# Patient Record
Sex: Female | Born: 1980 | Race: White | Hispanic: No | Marital: Married | Smoking: Former smoker
Health system: Southern US, Community
[De-identification: ages and names within clinical notes are randomized; demographics above are authoritative.]

## PROBLEM LIST (undated history)

## (undated) DIAGNOSIS — N390 Urinary tract infection, site not specified: Secondary | ICD-10-CM

## (undated) DIAGNOSIS — K219 Gastro-esophageal reflux disease without esophagitis: Secondary | ICD-10-CM

## (undated) DIAGNOSIS — Z8619 Personal history of other infectious and parasitic diseases: Secondary | ICD-10-CM

## (undated) DIAGNOSIS — J189 Pneumonia, unspecified organism: Secondary | ICD-10-CM

## (undated) HISTORY — DX: Personal history of other infectious and parasitic diseases: Z86.19

## (undated) HISTORY — PX: NO PAST SURGERIES: SHX2092

## (undated) HISTORY — DX: Urinary tract infection, site not specified: N39.0

---
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
## 2003-11-11 ENCOUNTER — Other Ambulatory Visit: Admission: RE | Admit: 2003-11-11 | Discharge: 2003-11-11 | Payer: Self-pay | Admitting: Obstetrics and Gynecology

## 2006-01-04 ENCOUNTER — Other Ambulatory Visit: Admission: RE | Admit: 2006-01-04 | Discharge: 2006-01-04 | Payer: Self-pay | Admitting: Obstetrics and Gynecology

## 2006-06-12 ENCOUNTER — Inpatient Hospital Stay (HOSPITAL_COMMUNITY): Admission: AD | Admit: 2006-06-12 | Discharge: 2006-06-12 | Payer: Self-pay | Admitting: Obstetrics and Gynecology

## 2006-07-20 ENCOUNTER — Inpatient Hospital Stay (HOSPITAL_COMMUNITY): Admission: AD | Admit: 2006-07-20 | Discharge: 2006-07-23 | Payer: Self-pay | Admitting: Obstetrics and Gynecology

## 2006-07-21 ENCOUNTER — Encounter (INDEPENDENT_AMBULATORY_CARE_PROVIDER_SITE_OTHER): Payer: Self-pay | Admitting: Specialist

## 2006-08-04 ENCOUNTER — Inpatient Hospital Stay (HOSPITAL_COMMUNITY): Admission: AD | Admit: 2006-08-04 | Discharge: 2006-08-05 | Payer: Self-pay | Admitting: Obstetrics and Gynecology

## 2006-08-04 ENCOUNTER — Encounter (INDEPENDENT_AMBULATORY_CARE_PROVIDER_SITE_OTHER): Payer: Self-pay | Admitting: Specialist

## 2006-12-27 ENCOUNTER — Emergency Department (HOSPITAL_COMMUNITY): Admission: EM | Admit: 2006-12-27 | Discharge: 2006-12-27 | Payer: Self-pay | Admitting: Emergency Medicine

## 2006-12-28 ENCOUNTER — Emergency Department (HOSPITAL_COMMUNITY): Admission: EM | Admit: 2006-12-28 | Discharge: 2006-12-28 | Payer: Self-pay | Admitting: Emergency Medicine

## 2008-01-07 ENCOUNTER — Emergency Department (HOSPITAL_COMMUNITY): Admission: EM | Admit: 2008-01-07 | Discharge: 2008-01-07 | Payer: Self-pay | Admitting: Emergency Medicine

## 2009-05-06 ENCOUNTER — Emergency Department (HOSPITAL_BASED_OUTPATIENT_CLINIC_OR_DEPARTMENT_OTHER): Admission: EM | Admit: 2009-05-06 | Discharge: 2009-05-06 | Payer: Self-pay | Admitting: Emergency Medicine

## 2009-10-13 ENCOUNTER — Emergency Department (HOSPITAL_BASED_OUTPATIENT_CLINIC_OR_DEPARTMENT_OTHER): Admission: EM | Admit: 2009-10-13 | Discharge: 2009-10-13 | Payer: Self-pay | Admitting: Emergency Medicine

## 2010-01-27 ENCOUNTER — Emergency Department (HOSPITAL_BASED_OUTPATIENT_CLINIC_OR_DEPARTMENT_OTHER): Admission: EM | Admit: 2010-01-27 | Discharge: 2010-01-27 | Payer: Self-pay | Admitting: Emergency Medicine

## 2010-12-26 ENCOUNTER — Emergency Department (HOSPITAL_BASED_OUTPATIENT_CLINIC_OR_DEPARTMENT_OTHER)
Admission: EM | Admit: 2010-12-26 | Discharge: 2010-12-26 | Payer: Self-pay | Source: Home / Self Care | Admitting: Emergency Medicine

## 2011-01-07 ENCOUNTER — Emergency Department (HOSPITAL_BASED_OUTPATIENT_CLINIC_OR_DEPARTMENT_OTHER)
Admission: EM | Admit: 2011-01-07 | Discharge: 2011-01-07 | Payer: Self-pay | Source: Home / Self Care | Admitting: Emergency Medicine

## 2011-01-31 ENCOUNTER — Emergency Department (INDEPENDENT_AMBULATORY_CARE_PROVIDER_SITE_OTHER): Payer: Managed Care, Other (non HMO)

## 2011-01-31 ENCOUNTER — Emergency Department (HOSPITAL_BASED_OUTPATIENT_CLINIC_OR_DEPARTMENT_OTHER)
Admission: EM | Admit: 2011-01-31 | Discharge: 2011-01-31 | Disposition: A | Discharge status: Other Acute Inpatient Hospital | Payer: Managed Care, Other (non HMO) | Source: Home / Self Care | Attending: Emergency Medicine | Admitting: Emergency Medicine

## 2011-01-31 ENCOUNTER — Inpatient Hospital Stay (HOSPITAL_COMMUNITY)
Admission: RE | Admit: 2011-01-31 | Discharge: 2011-02-02 | DRG: 203 | Disposition: A | Discharge status: Home / Self Care | Payer: Managed Care, Other (non HMO) | Source: Other Acute Inpatient Hospital | Attending: Internal Medicine | Admitting: Internal Medicine

## 2011-01-31 DIAGNOSIS — J45901 Unspecified asthma with (acute) exacerbation: Principal | ICD-10-CM | POA: Diagnosis present

## 2011-01-31 DIAGNOSIS — R0902 Hypoxemia: Secondary | ICD-10-CM | POA: Diagnosis present

## 2011-01-31 DIAGNOSIS — R000 Tachycardia, unspecified: Secondary | ICD-10-CM | POA: Diagnosis present

## 2011-01-31 DIAGNOSIS — R05 Cough: Secondary | ICD-10-CM

## 2011-01-31 DIAGNOSIS — D72829 Elevated white blood cell count, unspecified: Secondary | ICD-10-CM | POA: Diagnosis present

## 2011-01-31 LAB — BASIC METABOLIC PANEL
BUN: 7 mg/dL (ref 6–23)
Calcium: 8.8 mg/dL (ref 8.4–10.5)
Chloride: 107 mEq/L (ref 96–112)
GFR calc Af Amer: >60 mL/min (ref >60)
Potassium: 3.7 mEq/L (ref 3.5–5.1)
Sodium: 143 mEq/L (ref 135–145)

## 2011-01-31 LAB — DIFFERENTIAL
Basophils Absolute: 0.1 10*3/uL (ref 0.0–0.1)
Basophils Relative: 0 % (ref 0–1)
Eosinophils Relative: 12 % — ABNORMAL HIGH (ref 0–5)
Neutro Abs: 10.4 10*3/uL — ABNORMAL HIGH (ref 1.7–7.7)
Neutrophils Relative %: 68 % (ref 43–77)

## 2011-01-31 LAB — CBC: MCHC: 34.4 g/dL (ref 30.0–36.0)

## 2011-02-01 ENCOUNTER — Encounter (HOSPITAL_COMMUNITY): Payer: Self-pay

## 2011-02-01 ENCOUNTER — Inpatient Hospital Stay (HOSPITAL_COMMUNITY): Payer: Managed Care, Other (non HMO)

## 2011-02-01 MED ORDER — IOHEXOL 300 MG/ML  SOLN
100.0000 mL | Freq: Once | INTRAMUSCULAR | Status: AC | PRN
  Administered 2011-02-01: 100 mL via INTRAVENOUS

## 2011-02-05 NOTE — Discharge Summary (Signed)
NAMEGEORJEAN, [name]               ACCOUNT NO.:  [id number]  MEDICAL RECORD NO.:  [id number]           PATIENT TYPE:  I  LOCATION:  6732                         FACILITY:  [facility]  PHYSICIAN:  Andreas Blower, MD       DATE OF BIRTH:  Mar 03, 1981  DATE OF ADMISSION:  01/31/2011 DATE OF DISCHARGE:  02/02/2011                              DISCHARGE SUMMARY   PRIMARY CARE PHYSICIAN:  Eston Esters, MD,  [facility]  DISCHARGING DIAGNOSES: 1. Asthma exacerbation. 2. Hypoxia. 3. Tachycardia. 4. Leukocytosis.  DISCHARGE MEDICATIONS: 1. Advair Diskus 250/50 one puff twice daily to be started after     completing course of oral steroids. 2. Moxifloxacin 400 mg p.o. daily for 5 more days. 3. Prednisone 60 mg p.o. daily for 1 day, then 40 mg p.o. daily for 1     day, then 20 mg p.o. daily for 1 day, then 10 mg p.o. daily for 1     day, then 5 mg p.o. daily for 1 day, then discontinue. 4. Albuterol inhaler 1 puff daily as needed. 5. Vitamin C over-the-counter 1 tablet p.o. daily.  BRIEF ADMITTING HISTORY AND PHYSICAL:  Ms. [name] is a 30 year old Caucasian female with history of asthma who has been on Advair has been taken off Advair presents with increased wheezing, gray sputum, and productive cough and with asthma exacerbation.  RADIOLOGY/IMAGING: 1. The patient had chest x-ray two-view which shows findings     suggestive of bronchitis, interstitial pneumonia, or reactive     airway disease. 2. The patient had CT of the chest with contrast which shows no     evidence of pulmonary embolism.  Patchy bilateral upper lobe     airspace disease.  Minimal ground-glass opacities process seen in     the lower lobes.  Findings could represent small airway     disease/bronchiolitis or early bronchopneumonia.  LABORATORY DATA:  CBC shows a white count of 15.3, hemoglobin 14.1, hematocrit 41.0, and platelet count 323.  Electrolytes normal with a creatinine of 0.7.  D-dimer  0.51.  HOSPITAL COURSE BY PROBLEM: 1. Asthma exacerbation.  The patient was started on IV steroids and     was transitioned to p.o. steroids during the course of hospital     stay.  She was also started on empiric moxifloxacin which was     transitioned from IV to p.o.  At the time of discharge, the patient     was moving air pretty well and was having good air movements.     Given the CT scan findings, I instructed the patient to follow up     with her primary care physician and to consider referral to     Pulmonology for consideration of further evaluation.  If after the     referral Pulmonary feels the patient is managed appropriately, we     will defer to her primary care physician for further management. 2. Leukocytosis, most likely secondary to asthma exacerbation. 3. Hypoxia secondary to asthma exacerbation and improved during the     course of the hospital stay. 4. Tachycardia, most likely secondary  to albuterol use, improved     during the course of the hospital stay.  The patient was adequately     hydrated during the course of the hospital stay.  DISPOSITION AND FOLLOWUP:  The patient to follow up with Dr. Lissa Morales, her primary care physician in 1 week.  Her primary care physician to consider a referral to Pulmonology if appropriate.  The patient was also given a 25-month supply of Advair.  TIME SPENT:  On discharge, talking to the patient, coordinating care, and giving her the instruction was 35 minutes.     Andreas Blower, MD     SR/MEDQ  D:  02/02/2011  T:  02/03/2011  Job:  [id number]  Electronically Signed by Wardell Heath Novak Stgermaine  on 02/05/2011 03:56:36 PM

## 2011-02-10 NOTE — H&P (Signed)
NAMECATHYANN, [name] NO.:  [id number]  MEDICAL RECORD NO.:  [id number]           PATIENT TYPE:  LOCATION:                                 FACILITY:  PHYSICIAN:  Houston Siren, MD           DATE OF BIRTH:  [birth year]  DATE OF ADMISSION: DATE OF DISCHARGE:                             HISTORY & PHYSICAL   PRIMARY CARE PHYSICIAN:  Unassigned to hospitalist.  ADVANCE DIRECTIVE:  Full code.  REASON FOR ADMISSION:  Asthmatic exacerbation.  HISTORY OF PRESENT ILLNESS:  This is a 30 year old female with asthma, previously on Advair 250, who was taken off this as she saw her new physician and was placed natural supplements, presents to [facility] with increased wheezing, and gray sputum productive cough.  She denies any fever or chills or any chest pain.  She has no calf tenderness, nausea, or vomiting.  There has been no diaphoresis.  She states that she had molds and dusts in her home.  She denied any distant travel or any ill contact.  Evaluation in the emergency room at [facility] showed a chest x-ray which has no infiltrate but bronchitic changes.  She also has a white count of 15,000 and normal creatinine.  She was given intravenous Solu- Medrol and a 1-hour nebulizer treatment, but with ambulation, she desatted to 74%.  Because of it, she was transferred to [facility] for further treatment of her asthmatic exacerbation.  PAST MEDICAL HISTORY:  Asthma.  MEDICATIONS:  Previously Advair 250 and Ventolin inhaler.  ALLERGIES:  ASPIRIN.  SOCIAL HISTORY:  She is not a smoker, not a drinker.  Denies any illicit drug use.  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  She is married with children.  PHYSICAL EXAMINATION:  VITAL SIGNS:  Temperature 97.7, blood pressure 130/80, pulse of 100. GENERAL:  She is alert and oriented and conversing.  She is in no respiratory distress.  She uses no accessory muscles. HEENT:  Facial symmetry.  Tongue is  midline.  No sinus tenderness. Throat is clear.  No stridor. CARDIAC:  S1 and S2, regular.  No murmur, rub, or gallop. LUNGS:  Diffuse wheezing, especially with deep coughing but no crackle. ABDOMEN:  Obese, nondistended, nontender. EXTREMITIES:  No edema.  No calf tenderness.  Good distal pulses bilaterally. SKIN:  Warm and dry. NEUROLOGIC:  Unremarkable. PSYCHIATRIC:  Unremarkable as well.  OBJECTIVE FINDINGS:  Chest x-ray showed bronchitic changes but no infiltrate. Serum sodium 143, potassium 3.7, creatinine 0.7.  White count of 15.3 thousand, hemoglobin of 14.1, platelet count of 323,000.  IMPRESSION:  This is a 30 year old with asthma exacerbation.  Her symptom is mild.  We will admit her to general medical floor.  We will continue Solu-Medrol at 80 mg IV q.8 h. and switch over to p.o. as soon as possible.  I will continue nebulizer treatment while awake.  We will give her intravenous Avelox for her gray sputum productive cough.  I will give 2 g of magnesium IV as well.  We will give her supplemental oxygen.  Upon discharge, she probably  should go back to Advair 250.  She is a full code and will be admitted to triad hospitalist [place] [facility].     Houston Siren, MD     PL/MEDQ  D:  01/31/2011  T:  01/31/2011  Job:  [id number]  Electronically Signed by Houston Siren  on 02/10/2011 02:55:16 AM

## 2011-05-07 NOTE — H&P (Signed)
[name], [name] NO.:  [id number]   MEDICAL RECORD NO.:  [id number]          PATIENT TYPE:  MAT   LOCATION:                                 FACILITY:   PHYSICIAN:  Juluis Mire, M.D.   DATE OF BIRTH:  1981-01-18   DATE OF ADMISSION:  12/19/2006  DATE OF DISCHARGE:                                HISTORY & PHYSICAL   The patient is a 30 year old, gravida 1 para 0, married, white female last  menstrual period of October 13, 2006, giving her an estimated date of  confinement of July 20, 2006.  This gives her an estimated gestational 40  weeks.  She presents with questionable rupture of membranes which was not  documented, however, biophysical profile revealed an amniotic fluid index of  6.5-cm that was the 4th percentile.  Estimated fetal weight was 6 pounds 14  ounces and very decreased fluid. The patient is brought in for Cytotec  ripening cervix induction of labor.   ALLERGIES:  SHE IS ALLERGIC TO ASPIRIN.   MEDICATIONS:  Prenatal vitamins.   PRENATAL COURSE:  Has also been complicated by Group B strep otherwise  unremarkable.   For past medical history, family history, social history please see prenatal  records.   REVIEW OF SYSTEMS:  Noncontributory.   PHYSICAL EXAMINATION:  VITAL SIGNS:  The patient is afebrile, stable vital  signs.  LUNGS:  Clear.  CARDIAC:  Regular rate.  A grade 2/6 systolic ejection murmur.  ABDOMEN:  Gravid uterus consistent with dates.  PELVIC:  Cervic is fingertip and long.  Vertex presenting.  EXTREMITIES:  Trace edema.  NEUROLOGIC:  Grossly within normal limits.  Deep tendon reflexes 2+.  No  clonus.   IMPRESSION:  1. Intrauterine pregnancy at term with oligohydramnios.  2. Positive Group B strep.   PLAN:  1. The patient will be brought in for Cytotec ripening cervix and      induction of labor.  2. She will be given Penicillin-G for Group B strep prophylaxis.  3. Risks and benefits of induction have been  discussed.     Juluis Mire, M.D.  Electronically Signed    JSM/MEDQ  D:  07/20/2006  T:  07/20/2006  Job:  [id number]

## 2011-08-09 ENCOUNTER — Inpatient Hospital Stay (INDEPENDENT_AMBULATORY_CARE_PROVIDER_SITE_OTHER)
Admission: RE | Admit: 2011-08-09 | Discharge: 2011-08-09 | Disposition: A | Discharge status: Home / Self Care | Payer: Managed Care, Other (non HMO) | Source: Ambulatory Visit | Attending: Emergency Medicine | Admitting: Emergency Medicine

## 2011-08-09 ENCOUNTER — Encounter: Payer: Self-pay | Admitting: Emergency Medicine

## 2011-08-09 DIAGNOSIS — J45901 Unspecified asthma with (acute) exacerbation: Secondary | ICD-10-CM | POA: Insufficient documentation

## 2011-08-09 DIAGNOSIS — J45909 Unspecified asthma, uncomplicated: Secondary | ICD-10-CM

## 2011-08-09 DIAGNOSIS — J209 Acute bronchitis, unspecified: Secondary | ICD-10-CM | POA: Insufficient documentation

## 2011-08-11 ENCOUNTER — Telehealth (INDEPENDENT_AMBULATORY_CARE_PROVIDER_SITE_OTHER): Payer: Self-pay | Admitting: Emergency Medicine

## 2011-08-17 ENCOUNTER — Encounter: Payer: Self-pay | Admitting: Internal Medicine

## 2011-08-17 ENCOUNTER — Ambulatory Visit (INDEPENDENT_AMBULATORY_CARE_PROVIDER_SITE_OTHER): Payer: Managed Care, Other (non HMO) | Admitting: Internal Medicine

## 2011-08-17 DIAGNOSIS — J45909 Unspecified asthma, uncomplicated: Secondary | ICD-10-CM

## 2011-08-17 MED ORDER — PREDNISONE (PAK) 10 MG PO TABS: ORAL_TABLET | ORAL | Status: AC

## 2011-08-17 NOTE — Assessment & Plan Note (Signed)
Symptoms are markedly disproportionate to objective findings and not clear this is a lung problem but pt does appear to have difficult airway management issues.  DDX of  difficult airways managment all start with A and  include Adherence, Ace Inhibitors, Acid Reflux, Active Sinus Disease, Alpha 1 Antitripsin deficiency, Anxiety masquerading as Airways dz,  ABPA,  allergy(esp in young), Aspiration (esp in elderly), Adverse effects of DPI,  Active smokers, plus two Bs  = Bronchiectasis and Beta blocker use..and one C= CHF  In this case Adherence is the biggest issue and starts with  inability to use HFA effectively and also  understand that SABA treats the symptoms but doesn't get to the underlying problem (inflammation).  I used  the analogy of putting steroid cream on a rash to help explain the meaning of topical therapy and the need to get the drug to the target tissue.    The proper method of use, as well as anticipated side effects, of this metered-dose inhaler are discussed and demonstrated to the patient. Improved to 75% with coaching  ? Acid reflux/ occult, discussed at least short term suppression  See instructions for specific recommendations which were reviewed directly with the patient who was given a copy with highlighter outlining the key components.

## 2011-08-17 NOTE — Progress Notes (Signed)
  Subjective:    Patient ID: [name], female    DOB: 05-13-1981, 30 y.o.   MRN: [id number]  HPI  18 yowf dx with asthma age 69 with good control including nl pregnancy while on advair 250 in 2007    08/17/2011 Initial pulmonary office eval in EMR era cc asthma worse since 2009 p mold exposure thru Feb 2012 when left that  House> moved to [place] p admitted to [facility] Feb 2012 and discharged initially on advair for did ok for  several weeks while on prednisone then downhill since > sev ER trips and has developed neb dependence   Tried symbicort and dulera through urgent.  Took 2 puffs of dulera am of ov but still needed neb 2hours later. Sputum is thick, yellowish, no true plugs.  Sob with minimal adls and also wakening freqently. No overt hb or sinus complaints  Review of Systems  Constitutional: Positive for unexpected weight change. Negative for fever and chills.  HENT: Negative for ear pain, nosebleeds, congestion, sore throat, rhinorrhea, sneezing, trouble swallowing, dental problem, voice change, postnasal drip and sinus pressure.   Eyes: Negative for visual disturbance.  Respiratory: Positive for cough and shortness of breath. Negative for choking.   Cardiovascular: Positive for chest pain. Negative for leg swelling.  Gastrointestinal: Positive for abdominal pain. Negative for vomiting and diarrhea.  Genitourinary: Negative for difficulty urinating.  Musculoskeletal: Negative for arthralgias.  Skin: Negative for rash.  Neurological: Negative for tremors, syncope and headaches.  Hematological: Does not bruise/bleed easily.       Objective:   Physical Exam  08/17/2011  245   Anxious amb wf nad with trace pseudowheeze  HEENT: nl dentition, turbinates, and orophanx. Nl external ear canals without cough reflex   NECK :  without JVD/Nodes/TM/ nl carotid upstrokes bilaterally   LUNGS: no acc muscle use, clear to A and P bilaterally without cough on insp or exp  maneuvers   CV:  RRR  no s3 or murmur or increase in P2, no edema   ABD:  soft and nontender with nl excursion in the supine position. No bruits or organomegaly, bowel sounds nl  MS:  warm without deformities, calf tenderness, cyanosis or clubbing  SKIN: warm and dry without lesions    NEURO:  alert, approp, no deficits        Assessment & Plan:

## 2011-08-17 NOTE — Patient Instructions (Addendum)
Dulera 100 Take 2 puffs first thing in am and then another 2 puffs about 12 hours later.   Work on Musician technique:  relax and gently blow all the way out then take a nice smooth deep breath back in, triggering the inhaler at same time you start breathing in.  Hold for up to 5 seconds if you can.  Rinse and gargle with water when done   If your mouth or throat starts to bother you,   I suggest you time the inhaler to your dental care and after using the inhaler(s) brush teeth and tongue with a baking soda containing toothpaste and when you rinse this out, gargle with it first to see if this helps your mouth and throat.     Prednisone 10 mg take  4 each am x 2 days,   2 each am x 2 days,  1 each am x2days and stop   If can't catch your breath ok to ventolin up to 2 puffs and only if this doesn't work ok to use the nebulizer but stop using the spacer  Try prilosec 20mg   Take 30-60 min before first meal of the day and Pepcid 20 mg one bedtime until cough/wheezing/ short of breath are  completely gone for at least a week without the need for cough suppression  I think of reflux for chronic cough like I do oxygen for fire (doesn't cause the fire but once you get the oxygen suppressed it usually goes away regardless of the exact cause).   GERD (REFLUX)  is an extremely common cause of respiratory symptoms, many times with no significant heartburn at all.    It can be treated with medication, but also with lifestyle changes including avoidance of late meals, excessive alcohol, smoking cessation, and avoid fatty foods, chocolate, peppermint, colas, red wine, and acidic juices such as orange juice.  NO MINT OR MENTHOL PRODUCTS SO NO COUGH DROPS  USE SUGARLESS CANDY INSTEAD (jolley ranchers or Stover's)  NO OIL BASED VITAMINS   Please schedule a follow up office visit in 4 weeks, sooner if needed

## 2011-11-22 NOTE — Progress Notes (Signed)
Summary: SOB,ASTHMA...WSE rm 3   Vital Signs:  Patient Profile:   30 Years Old Female CC:      SOB, asthma x today Weight:      247.25 pounds O2 Sat:      89 % O2 treatment:    Room Air Temp:     98.1 degrees F oral Pulse rate:   118 / minute Resp:     24 per minute BP sitting:   127 / 89  (left arm) Cuff size:   large  Vitals Entered By: Clemens Catholic LPN (August 09, 2011 8:01 PM)                 Serial Vital Signs/Assessments:                                PEF    PreRx  PostRx Time      O2 Sat  O2 Type     L/min  L/min  L/min   By 8:06 PM   92  %   Room air                          Clemens Catholic LPN  Comments: 7:82 PM after nebulizer treatment By: Clemens Catholic LPN  O2 AFTER SECOND NEB TREATMENT 94% By: Clemens Catholic LPN    Updated Prior Medication List: ALBUTEROL SULFATE (5 MG/ML) 0.5% NEBU (ALBUTEROL SULFATE)  VENTOLIN HFA 108 (90 BASE) MCG/ACT AERS (ALBUTEROL SULFATE)   Current Allergies: ! ASPIRINHistory of Present Illness History from: patient and husband Chief Complaint: SOB, asthma x today History of Present Illness: 30 year old female, today developed progressively severe worsening dyspnea and wheezing. No definite triggers identified today. She notes that she's been exposed to mold in her home over the past several months. Over the past week, increased cough, productive of slight yellow sputum., perhaps low-grade fever without chills or sweats. No nausea or vomiting. She tried her home albuterol HFA and nebulizer Q2 to three hours today without help. past medical history of long-standing asthma but she currently does not have a pulmonary specialist. Last hospital admission for asthma exacerbation was February 2012.  REVIEW OF SYSTEMS Constitutional Symptoms      Denies fever, chills, night sweats, weight loss, weight gain, and fatigue.  Eyes       Denies change in vision, eye pain, eye discharge, glasses, contact lenses, and eye  surgery. Ear/Nose/Throat/Mouth       Denies hearing loss/aids, change in hearing, ear pain, ear discharge, dizziness, frequent runny nose, frequent nose bleeds, sinus problems, sore throat, hoarseness, and tooth pain or bleeding.  Respiratory       Complains of dry cough, wheezing, shortness of breath, and asthma.      Denies productive cough, bronchitis, and emphysema/COPD.  Cardiovascular       Complains of chest pain.      Denies murmurs and tires easily with exhertion.    Gastrointestinal       Denies stomach pain, nausea/vomiting, diarrhea, constipation, blood in bowel movements, and indigestion. Genitourniary       Denies painful urination, kidney stones, and loss of urinary control. Neurological       Denies paralysis, seizures, and fainting/blackouts. Musculoskeletal       Denies muscle pain, joint pain, joint stiffness, decreased range of motion, redness, swelling, muscle weakness, and gout.  Skin       Denies  bruising, unusual mles/lumps or sores, and hair/skin or nail changes.  Psych       Denies mood changes, temper/anger issues, anxiety/stress, speech problems, depression, and sleep problems. Other Comments: pt c/o SOB, asthma x this morning.  she was admitted to [facility] in 2/12' for asthma. she had albuterol neb last at 6pm today.   Past History:  Past Medical History: Asthma  Past Surgical History: Denies surgical history  Family History: none  Social History: Never Smoked Alcohol use-no Drug use-no Smoking Status:  never Drug Use:  no Physical Exam General appearance: initially, moderately acute respiratory distress,tachypneic. Alert. O2 saturation 89% room air Head: normocephalic, atraumatic Eyes: conjunctivae and lids normal Ears: normal, no lesions or deformities Nasal: mucosa pink, nonedematous, no septal deviation, turbinates normal Oral/Pharynx: tongue normal, posterior pharynx without erythema or exudate Neck: neck supple,  trachea midline, no  masses Chest/Lungs: very tight inspiratory expiratory wheezes. Fair-poor air movement bilaterally Heart: tachycardic, regular rate and rhythm without murmur Abdomen: soft, nontender without masses GU: deferred Extremities: no cyanosis, clubbing or edema Neurological: grossly intact and non-focal Skin: warm, moist, without rash MSE: oriented to time, place, and person.mildly anxious. Assessment New Problems: ACUTE BRONCHITIS (ICD-466.0) ASTHMA, WITH ACUTE EXACERBATION (ICD-493.92) ASTHMA (ICD-493.90)   Plan New Orders: Albuterol Sulfate Sol 1mg  unit dose [W0981] Ipratropium inhalation sol. unit dose [X9147] Nebulizer Tx [94640] Depo- Medrol 40mg  [J1030] Admin of Therapeutic Inj  intramuscular or subcutaneous [96372] Albuterol Sulfate Sol 1mg  unit dose [J7613] Ipratropium inhalation sol. unit dose [W2956] New Patient Level IV [99204] Planning Comments:   Tthe following is a summary of the course over  the approx  90 min in our urgent care office (Further details are documented elsewhere in this emr note): The patient was immediately brought back to a treatment room and immediately assessed by both nurse and physician. DuoNeb nebulizer treatment was then immediately started on patient. She was continually monitored and reassessed. She had definite improvement after the first nebulizer treatment, and felt less short of breath, oxygen saturation improved to 91-92%, lungs showed somewhat improved aeration on auscultation, still with mild to moderate later expiratory wheezes. Serial vital signs, o2 sats as documented. Depo-Medrol 80 mg IM given. Approximately 40 min. after the first nebulizer treatment, a second nebulizer treatment of albuterol and ipratropium ( DuoNeb) was administered. Patient later rechecked and she felt significantly better. Her breathing was much improved and she felt better. Lungs on auscultation were clear except for rare late expiratory wheeze, rare rhonchi. No rales.  Good air movement bilaterally. oxygen saturation improved to 94-95%  The following meds prescribed: Prednisone 10 mg- 6 day dose pack. Zithromax Z-Pak.-- She will use her home  albuterol nebulizer, as needed for rescue inhaler.   Follow Up: will be arranged with pulmonologist within one week. Patient advised to follow-up here or ED if any worsening or new symptoms in the meantime. Patient and husband voiced understanding and agreement with all of the above plans.  The patient and/or caregiver has been counseled thoroughly with regard to medications prescribed including dosage, schedule, interactions, rationale for use, and possible side effects and they verbalize understanding.  Diagnoses and expected course of recovery discussed and will return if not improved as expected or if the condition worsens. Patient and/or caregiver verbalized understanding.   Medication Administration  Injection # 1:    Medication: Depo- Medrol 40mg     Diagnosis: ASTHMA (ICD-493.90)    Route: IM    Site: RUOQ gluteus    Exp Date: 10/21/2011  Lot #: [id number]    Mfr: Pharmacia    Comments: 80 MG GIVEN     Patient tolerated injection without complications    Given by: Clemens Catholic LPN (August 09, 2011 8:46 PM)  Medication # 1:    Medication: Albuterol Sulfate Sol 1mg  unit dose    Diagnosis: ASTHMA (ICD-493.90)    Dose: 3.0mg      Route: inhaled    Exp Date: 04/19/2012    Lot #: [id number]    Mfr: mylan    Comments: douneb    Patient tolerated medication without complications    Given by: Clemens Catholic LPN (August 09, 2011 8:07 PM)  Medication # 2:    Medication: Ipratropium inhalation sol. unit dose    Diagnosis: ASTHMA (ICD-493.90)    Dose: 0.5mg      Route: inhaled    Exp Date: 04/19/2012    Lot #: [id number]    Mfr: mylan    Comments: douneb    Patient tolerated medication without complications    Given by: Clemens Catholic LPN (August 09, 2011 8:08 PM)  Medication # 3:    Medication: Albuterol  Sulfate Sol 1mg  unit dose    Diagnosis: ASTHMA (ICD-493.90)    Dose: 3.0MG      Route: inhaled    Exp Date: 04/19/2012    Lot #: [id number]    Mfr: Cori Razor    Comments: DOUNEB #2    Patient tolerated medication without complications    Given by: Clemens Catholic LPN (August 09, 2011 8:47 PM)  Medication # 4:    Medication: Ipratropium inhalation sol. unit dose    Diagnosis: ASTHMA (ICD-493.90)    Dose: 0.5MG     Route: inhaled    Exp Date: 04/19/2012    Lot #: [id number]    Mfr: Cori Razor    Comments: DOUNEB # 2     Patient tolerated medication without complications    Given by: Clemens Catholic LPN (August 09, 2011 8:48 PM)  Orders Added: 1)  Albuterol Sulfate Sol 1mg  unit dose [G2952] 2)  Ipratropium inhalation sol. unit dose [J7644] 3)  Nebulizer Tx [94640] 4)  Depo- Medrol 40mg  [J1030] 5)  Admin of Therapeutic Inj  intramuscular or subcutaneous [96372] 6)  Albuterol Sulfate Sol 1mg  unit dose [J7613] 7)  Ipratropium inhalation sol. unit dose [J7644] 8)  New Patient Level IV [84132]

## 2011-11-22 NOTE — Telephone Encounter (Signed)
  Phone Note Outgoing Call Call back at [facility] Phone [phone]   Call placed by: Emilio Math,  August 11, 2011 11:30 AM Call placed to: Patient Summary of Call: Unable to leave message

## 2012-11-26 ENCOUNTER — Inpatient Hospital Stay (HOSPITAL_BASED_OUTPATIENT_CLINIC_OR_DEPARTMENT_OTHER)
Admission: EM | Admit: 2012-11-26 | Discharge: 2012-11-29 | DRG: 096 | Disposition: A | Discharge status: Home / Self Care | Payer: BC Managed Care – PPO | Attending: Internal Medicine | Admitting: Internal Medicine

## 2012-11-26 ENCOUNTER — Emergency Department (HOSPITAL_BASED_OUTPATIENT_CLINIC_OR_DEPARTMENT_OTHER): Payer: BC Managed Care – PPO

## 2012-11-26 ENCOUNTER — Encounter (HOSPITAL_BASED_OUTPATIENT_CLINIC_OR_DEPARTMENT_OTHER): Payer: Self-pay | Admitting: Emergency Medicine

## 2012-11-26 DIAGNOSIS — R000 Tachycardia, unspecified: Secondary | ICD-10-CM | POA: Clinically undetermined

## 2012-11-26 DIAGNOSIS — J45909 Unspecified asthma, uncomplicated: Secondary | ICD-10-CM

## 2012-11-26 DIAGNOSIS — J209 Acute bronchitis, unspecified: Secondary | ICD-10-CM | POA: Diagnosis present

## 2012-11-26 DIAGNOSIS — T380X5A Adverse effect of glucocorticoids and synthetic analogues, initial encounter: Secondary | ICD-10-CM | POA: Diagnosis not present

## 2012-11-26 DIAGNOSIS — E876 Hypokalemia: Secondary | ICD-10-CM | POA: Diagnosis present

## 2012-11-26 DIAGNOSIS — Z792 Long term (current) use of antibiotics: Secondary | ICD-10-CM

## 2012-11-26 DIAGNOSIS — Z881 Allergy status to other antibiotic agents status: Secondary | ICD-10-CM

## 2012-11-26 DIAGNOSIS — Z888 Allergy status to other drugs, medicaments and biological substances status: Secondary | ICD-10-CM

## 2012-11-26 DIAGNOSIS — D72829 Elevated white blood cell count, unspecified: Secondary | ICD-10-CM | POA: Diagnosis present

## 2012-11-26 DIAGNOSIS — Z886 Allergy status to analgesic agent status: Secondary | ICD-10-CM

## 2012-11-26 DIAGNOSIS — J069 Acute upper respiratory infection, unspecified: Secondary | ICD-10-CM

## 2012-11-26 DIAGNOSIS — Y92009 Unspecified place in unspecified non-institutional (private) residence as the place of occurrence of the external cause: Secondary | ICD-10-CM

## 2012-11-26 DIAGNOSIS — E860 Dehydration: Secondary | ICD-10-CM | POA: Diagnosis present

## 2012-11-26 DIAGNOSIS — J45901 Unspecified asthma with (acute) exacerbation: Principal | ICD-10-CM | POA: Diagnosis present

## 2012-11-26 DIAGNOSIS — D649 Anemia, unspecified: Secondary | ICD-10-CM | POA: Diagnosis present

## 2012-11-26 DIAGNOSIS — I498 Other specified cardiac arrhythmias: Secondary | ICD-10-CM | POA: Diagnosis present

## 2012-11-26 HISTORY — DX: Pneumonia, unspecified organism: J18.9

## 2012-11-26 LAB — URINALYSIS, ROUTINE W REFLEX MICROSCOPIC
Bilirubin Urine: NEGATIVE
Hgb urine dipstick: NEGATIVE
Ketones, ur: NEGATIVE mg/dL
Leukocytes, UA: NEGATIVE
Protein, ur: NEGATIVE mg/dL
pH: 7 (ref 5.0–8.0)

## 2012-11-26 LAB — CBC WITH DIFFERENTIAL/PLATELET
Basophils Absolute: 0 10*3/uL (ref 0.0–0.1)
Eosinophils Relative: 3 % (ref 0–5)
Hemoglobin: 12.9 g/dL (ref 12.0–15.0)
Lymphocytes Relative: 6 % — ABNORMAL LOW (ref 12–46)
MCH: 29.9 pg (ref 26.0–34.0)
MCHC: 35 g/dL (ref 30.0–36.0)
MCV: 85.4 fL (ref 78.0–100.0)
RDW: 13.2 % (ref 11.5–15.5)

## 2012-11-26 LAB — GLUCOSE, CAPILLARY

## 2012-11-26 LAB — CBC
HCT: 38.6 % (ref 36.0–46.0)
MCH: 29.2 pg (ref 26.0–34.0)
MCHC: 33.4 g/dL (ref 30.0–36.0)
MCV: 87.3 fL (ref 78.0–100.0)
RDW: 13.4 % (ref 11.5–15.5)

## 2012-11-26 LAB — COMPREHENSIVE METABOLIC PANEL
AST: 27 U/L (ref 0–37)
Albumin: 3.7 g/dL (ref 3.5–5.2)
BUN: 7 mg/dL (ref 6–23)
Calcium: 9.2 mg/dL (ref 8.4–10.5)
GFR calc Af Amer: >90 mL/min (ref >90)
GFR calc non Af Amer: >90 mL/min (ref >90)
Total Protein: 7.7 g/dL (ref 6.0–8.3)

## 2012-11-26 LAB — INFLUENZA PANEL BY PCR (TYPE A & B): H1N1 flu by pcr: NOT DETECTED

## 2012-11-26 LAB — CREATININE, SERUM: GFR calc Af Amer: >90 mL/min (ref >90)

## 2012-11-26 LAB — PREGNANCY, URINE: Preg Test, Ur: NEGATIVE

## 2012-11-26 MED ORDER — ALBUTEROL SULFATE (5 MG/ML) 0.5% IN NEBU
5.0000 mg | INHALATION_SOLUTION | Freq: Once | RESPIRATORY_TRACT | Status: AC
  Administered 2012-11-26: 5 mg via RESPIRATORY_TRACT

## 2012-11-26 MED ORDER — DIPHENHYDRAMINE HCL 50 MG/ML IJ SOLN
INTRAMUSCULAR | Status: AC
  Filled 2012-11-26: qty 1

## 2012-11-26 MED ORDER — ALBUTEROL SULFATE (5 MG/ML) 0.5% IN NEBU
INHALATION_SOLUTION | RESPIRATORY_TRACT | Status: AC
  Administered 2012-11-26: 5 mg via RESPIRATORY_TRACT
  Filled 2012-11-26: qty 1

## 2012-11-26 MED ORDER — PANTOPRAZOLE SODIUM 40 MG PO TBEC
40.0000 mg | DELAYED_RELEASE_TABLET | Freq: Every day | ORAL | Status: DC
  Administered 2012-11-26 – 2012-11-29 (×4): 40 mg via ORAL
  Filled 2012-11-26 (×4): qty 1

## 2012-11-26 MED ORDER — POTASSIUM CHLORIDE CRYS ER 20 MEQ PO TBCR
40.0000 meq | EXTENDED_RELEASE_TABLET | Freq: Once | ORAL | Status: AC
  Administered 2012-11-26: 40 meq via ORAL
  Filled 2012-11-26: qty 2

## 2012-11-26 MED ORDER — IPRATROPIUM BROMIDE 0.02 % IN SOLN
0.5000 mg | Freq: Once | RESPIRATORY_TRACT | Status: AC
  Administered 2012-11-26: 0.5 mg via RESPIRATORY_TRACT

## 2012-11-26 MED ORDER — DIPHENHYDRAMINE HCL 50 MG/ML IJ SOLN
25.0000 mg | Freq: Once | INTRAMUSCULAR | Status: AC
  Administered 2012-11-26: 25 mg via INTRAVENOUS

## 2012-11-26 MED ORDER — METHYLPREDNISOLONE SODIUM SUCC 125 MG IJ SOLR
125.0000 mg | Freq: Once | INTRAMUSCULAR | Status: AC
  Administered 2012-11-26: 125 mg via INTRAVENOUS
  Filled 2012-11-26: qty 2

## 2012-11-26 MED ORDER — ALBUTEROL SULFATE (5 MG/ML) 0.5% IN NEBU
INHALATION_SOLUTION | RESPIRATORY_TRACT | Status: AC
  Filled 2012-11-26: qty 1

## 2012-11-26 MED ORDER — ENOXAPARIN SODIUM 40 MG/0.4ML ~~LOC~~ SOLN
40.0000 mg | SUBCUTANEOUS | Status: DC
  Administered 2012-11-26: 40 mg via SUBCUTANEOUS
  Filled 2012-11-26 (×4): qty 0.4

## 2012-11-26 MED ORDER — POTASSIUM CHLORIDE IN NACL 20-0.9 MEQ/L-% IV SOLN
INTRAVENOUS | Status: DC
  Administered 2012-11-26 – 2012-11-27 (×2): via INTRAVENOUS
  Filled 2012-11-26 (×3): qty 1000

## 2012-11-26 MED ORDER — GUAIFENESIN ER 600 MG PO TB12
600.0000 mg | ORAL_TABLET | Freq: Two times a day (BID) | ORAL | Status: DC
  Administered 2012-11-26 – 2012-11-27 (×2): 600 mg via ORAL
  Filled 2012-11-26 (×3): qty 1

## 2012-11-26 MED ORDER — ALBUTEROL SULFATE HFA 108 (90 BASE) MCG/ACT IN AERS
2.0000 | INHALATION_SPRAY | RESPIRATORY_TRACT | Status: DC | PRN
  Filled 2012-11-26: qty 6.7

## 2012-11-26 MED ORDER — FAMOTIDINE 10 MG PO TABS
10.0000 mg | ORAL_TABLET | Freq: Every day | ORAL | Status: DC
  Administered 2012-11-26 – 2012-11-28 (×3): 10 mg via ORAL
  Filled 2012-11-26 (×4): qty 1

## 2012-11-26 MED ORDER — ALBUTEROL SULFATE (5 MG/ML) 0.5% IN NEBU: 2.5000 mg | INHALATION_SOLUTION | RESPIRATORY_TRACT | Status: DC | PRN

## 2012-11-26 MED ORDER — ALBUTEROL SULFATE (5 MG/ML) 0.5% IN NEBU
5.0000 mg | INHALATION_SOLUTION | RESPIRATORY_TRACT | Status: DC
  Administered 2012-11-26 – 2012-11-27 (×3): 5 mg via RESPIRATORY_TRACT
  Filled 2012-11-26: qty 0.5
  Filled 2012-11-26: qty 1
  Filled 2012-11-26: qty 0.5

## 2012-11-26 MED ORDER — ALBUTEROL SULFATE (5 MG/ML) 0.5% IN NEBU
2.5000 mg | INHALATION_SOLUTION | RESPIRATORY_TRACT | Status: DC
  Administered 2012-11-26 – 2012-11-27 (×2): 2.5 mg via RESPIRATORY_TRACT
  Filled 2012-11-26: qty 1

## 2012-11-26 MED ORDER — LEVOFLOXACIN IN D5W 750 MG/150ML IV SOLN: 750.0000 mg | Freq: Once | INTRAVENOUS | Status: DC

## 2012-11-26 MED ORDER — AZITHROMYCIN 500 MG PO TABS
500.0000 mg | ORAL_TABLET | Freq: Every day | ORAL | Status: DC
  Administered 2012-11-26 – 2012-11-29 (×4): 500 mg via ORAL
  Filled 2012-11-26 (×4): qty 1

## 2012-11-26 MED ORDER — METHYLPREDNISOLONE SODIUM SUCC 125 MG IJ SOLR
80.0000 mg | Freq: Four times a day (QID) | INTRAMUSCULAR | Status: DC
  Administered 2012-11-26: 18:00:00 via INTRAVENOUS
  Administered 2012-11-27 (×3): 80 mg via INTRAVENOUS
  Filled 2012-11-26 (×7): qty 1.28

## 2012-11-26 MED ORDER — MOMETASONE FURO-FORMOTEROL FUM 100-5 MCG/ACT IN AERO
2.0000 | INHALATION_SPRAY | Freq: Two times a day (BID) | RESPIRATORY_TRACT | Status: DC
  Administered 2012-11-27 – 2012-11-29 (×6): 2 via RESPIRATORY_TRACT
  Filled 2012-11-26: qty 8.8

## 2012-11-26 MED ORDER — LEVOFLOXACIN IN D5W 750 MG/150ML IV SOLN
750.0000 mg | Freq: Once | INTRAVENOUS | Status: DC
  Administered 2012-11-26: 750 mg via INTRAVENOUS
  Filled 2012-11-26: qty 150

## 2012-11-26 MED ORDER — SODIUM CHLORIDE 0.9 % IV BOLUS (SEPSIS)
1000.0000 mL | Freq: Once | INTRAVENOUS | Status: AC
  Administered 2012-11-26: 1000 mL via INTRAVENOUS

## 2012-11-26 MED ORDER — INSULIN ASPART 100 UNIT/ML ~~LOC~~ SOLN
0.0000 [IU] | Freq: Three times a day (TID) | SUBCUTANEOUS | Status: DC
  Administered 2012-11-26 – 2012-11-27 (×3): 2 [IU] via SUBCUTANEOUS
  Administered 2012-11-28: 3 [IU] via SUBCUTANEOUS

## 2012-11-26 MED ORDER — DEXTROSE 5 % IV SOLN
1.0000 g | INTRAVENOUS | Status: DC
  Administered 2012-11-26 – 2012-11-28 (×3): 1 g via INTRAVENOUS
  Filled 2012-11-26 (×4): qty 10

## 2012-11-26 MED ORDER — IPRATROPIUM BROMIDE 0.02 % IN SOLN
RESPIRATORY_TRACT | Status: AC
  Administered 2012-11-26: 0.5 mg via RESPIRATORY_TRACT
  Filled 2012-11-26: qty 2.5

## 2012-11-26 NOTE — ED Notes (Signed)
Pt states she has been sick for a week.  Went to [facility] on Tuesday and was diagnosed with pneumonia.  Pt states she was given antibiotics but states she is not feeling better.  Also has had a GI bug this week as well.  Pt coughing, unproductive, nasal congestion and difficulty breathing.  Pt states her asthma is flaring up.

## 2012-11-26 NOTE — ED Provider Notes (Signed)
History     CSN: [id number]  Arrival date & time 11/26/12  4098   First MD Initiated Contact with Patient 11/26/12 [phone]      Chief Complaint  Patient presents with  . Cough  . Fever  . Shortness of Breath    (Consider location/radiation/quality/duration/timing/severity/associated sxs/prior treatment) HPI  Patient complaining of cough and dyspnea.  Seen at [facility] on Tuesday with fever and diagnosed with pneumonia.  Patient treated with zithromax and better Thurday through Friday.  Completed antibiotics yesterday and began having worse cough,dyspnea, and fever to 102 last night.  Patient states she has had multiple episodes of pneumonia, last year she required hospitalization.  She also has asthma and required her albuterol every two hours during the night.  She has not recently been on steroids.  She is postpartum 8 weeks, s/p c-section.   Past Medical History  Diagnosis Date  . Asthma   . Pneumonia     No past surgical history on file.  Family History  Problem Relation Age of Onset  . Allergies Sister     History  Substance Use Topics  . Smoking status: Never Smoker   . Smokeless tobacco: Never Used  . Alcohol Use: No    OB History    Grav Para Term Preterm Abortions TAB SAB Ect Mult Living                  Review of Systems  Constitutional: Positive for fever, chills, appetite change and fatigue. Negative for activity change and unexpected weight change.  HENT: Negative for sore throat, rhinorrhea, neck pain, neck stiffness and sinus pressure.   Eyes: Negative for visual disturbance.  Respiratory: Positive for cough and wheezing. Negative for shortness of breath.   Cardiovascular: Negative for chest pain and leg swelling.  Gastrointestinal: Negative for vomiting, abdominal pain, diarrhea and blood in stool.  Genitourinary: Negative for dysuria, urgency, frequency, vaginal discharge and difficulty urinating.  Musculoskeletal: Negative for myalgias, arthralgias and  gait problem.  Skin: Negative for color change and rash.  Neurological: Negative for weakness, light-headedness and headaches.  Hematological: Does not bruise/bleed easily.  Psychiatric/Behavioral: Negative for dysphoric mood.    Allergies  Aspirin; Aspirin free childs; and Pamprin  Home Medications   Current Outpatient Rx  Name  Route  Sig  Dispense  Refill  . ALBUTEROL SULFATE HFA 108 (90 BASE) MCG/ACT IN AERS   Inhalation   Inhale 2 puffs into the lungs every 6 (six) hours as needed.           . MOMETASONE FURO-FORMOTEROL FUM 100-5 MCG/ACT IN AERO   Inhalation   Inhale 2 puffs into the lungs 2 (two) times daily.             BP 103/47  Pulse 114  Temp 98.9 F (37.2 C) (Oral)  Resp 16  SpO2 92%  LMP 09/27/2012  Physical Exam  Nursing note and vitals reviewed. Constitutional: She appears well-developed and well-nourished.  HENT:  Head: Normocephalic and atraumatic.  Eyes: Conjunctivae normal and EOM are normal. Pupils are equal, round, and reactive to light.  Neck: Normal range of motion. Neck supple.  Cardiovascular: Regular rhythm, normal heart sounds and intact distal pulses.  Tachycardia present.   Pulmonary/Chest: Tachypnea noted. She has wheezes.  Abdominal: Soft. Bowel sounds are normal.  Musculoskeletal: Normal range of motion. She exhibits no edema and no tenderness.  Neurological: She is alert.  Skin: Skin is warm and dry.  Psychiatric: She has a normal  mood and affect. Thought content normal.    ED Course  Procedures (including critical care time)  Labs Reviewed  CBC WITH DIFFERENTIAL - Abnormal; Notable for the following:    WBC 19.5 (*)     Neutrophils Relative 87 (*)     Neutro Abs 17.0 (*)     Lymphocytes Relative 6 (*)     All other components within normal limits  COMPREHENSIVE METABOLIC PANEL - Abnormal; Notable for the following:    Potassium 3.3 (*)     Glucose, Bld 140 (*)     ALT 56 (*)     Total Bilirubin 0.2 (*)     All other  components within normal limits  URINALYSIS, ROUTINE W REFLEX MICROSCOPIC  PREGNANCY, URINE   Dg Chest 2 View  11/26/2012  *RADIOLOGY REPORT*  Clinical Data: Pneumonia  CHEST - 2 VIEW  Comparison: 01/31/2011  Findings: The cardiomediastinal silhouette is stable.  Thoracic dextroscoliosis again noted.  No acute infiltrate or pulmonary edema.  Central bronchitic changes.  No pleural effusion.  IMPRESSION: Thoracic dextroscoliosis again noted.  Central bronchitic changes. No acute infiltrate or pulmonary edema.   Original Report Authenticated By: Natasha Mead, M.D.      No diagnosis found.  Results for orders placed during the hospital encounter of 11/26/12  CBC WITH DIFFERENTIAL      Component Value Range   WBC 19.5 (*) 4.0 - 10.5 K/uL   RBC 4.32  3.87 - 5.11 MIL/uL   Hemoglobin 12.9  12.0 - 15.0 g/dL   HCT 16.1  09.6 - 04.5 %   MCV 85.4  78.0 - 100.0 fL   MCH 29.9  26.0 - 34.0 pg   MCHC 35.0  30.0 - 36.0 g/dL   RDW 40.9  81.1 - 91.4 %   Platelets 347  150 - 400 K/uL   Neutrophils Relative 87 (*) 43 - 77 %   Neutro Abs 17.0 (*) 1.7 - 7.7 K/uL   Lymphocytes Relative 6 (*) 12 - 46 %   Lymphs Abs 1.1  0.7 - 4.0 K/uL   Monocytes Relative 5  3 - 12 %   Monocytes Absolute 0.9  0.1 - 1.0 K/uL   Eosinophils Relative 3  0 - 5 %   Eosinophils Absolute 0.5  0.0 - 0.7 K/uL   Basophils Relative 0  0 - 1 %   Basophils Absolute 0.0  0.0 - 0.1 K/uL   Smear Review MORPHOLOGY UNREMARKABLE    COMPREHENSIVE METABOLIC PANEL      Component Value Range   Sodium 140  135 - 145 mEq/L   Potassium 3.3 (*) 3.5 - 5.1 mEq/L   Chloride 102  96 - 112 mEq/L   CO2 26  19 - 32 mEq/L   Glucose, Bld 140 (*) 70 - 99 mg/dL   BUN 7  6 - 23 mg/dL   Creatinine, Ser 7.82  0.50 - 1.10 mg/dL   Calcium 9.2  8.4 - 95.6 mg/dL   Total Protein 7.7  6.0 - 8.3 g/dL   Albumin 3.7  3.5 - 5.2 g/dL   AST 27  0 - 37 U/L   ALT 56 (*) 0 - 35 U/L   Alkaline Phosphatase 105  39 - 117 U/L   Total Bilirubin 0.2 (*) 0.3 - 1.2 mg/dL    GFR calc non Af Amer >90  >90 mL/min   GFR calc Af Amer >90  >90 mL/min  URINALYSIS, ROUTINE W REFLEX MICROSCOPIC  Component Value Range   Color, Urine YELLOW  YELLOW   APPearance CLEAR  CLEAR   Specific Gravity, Urine 1.009  1.005 - 1.030   pH 7.0  5.0 - 8.0   Glucose, UA NEGATIVE  NEGATIVE mg/dL   Hgb urine dipstick NEGATIVE  NEGATIVE   Bilirubin Urine NEGATIVE  NEGATIVE   Ketones, ur NEGATIVE  NEGATIVE mg/dL   Protein, ur NEGATIVE  NEGATIVE mg/dL   Urobilinogen, UA 0.2  0.0 - 1.0 mg/dL   Nitrite NEGATIVE  NEGATIVE   Leukocytes, UA NEGATIVE  NEGATIVE  PREGNANCY, URINE      Component Value Range   Preg Test, Ur NEGATIVE  NEGATIVE   Dg Chest 2 View  11/26/2012  *RADIOLOGY REPORT*  Clinical Data: Pneumonia  CHEST - 2 VIEW  Comparison: 01/31/2011  Findings: The cardiomediastinal silhouette is stable.  Thoracic dextroscoliosis again noted.  No acute infiltrate or pulmonary edema.  Central bronchitic changes.  No pleural effusion.  IMPRESSION: Thoracic dextroscoliosis again noted.  Central bronchitic changes. No acute infiltrate or pulmonary edema.   Original Report Authenticated By: Natasha Mead, M.D.     MDM  Patient given solumedrol, nebs x 2, iv ns.  Hr decreased to 110.  Patient without wheezing on my repeat lung exam but desats to 85% at rest.   Discussed with Dr. Jerral Ralph and plan transfer to [facility] to MedSurg bed team 3. Patient will be started on Levaquin IV here in consultation with Dr. Jerral Ralph  patient will be placed on scheduled nebs while awaiting transport.      Hilario Quarry, MD 11/26/12 [phone]

## 2012-11-26 NOTE — ED Notes (Signed)
MD at bedside. 

## 2012-11-26 NOTE — H&P (Addendum)
[facility] History and Physical  [name] ZHY:[id number] DOB: January 29, 1981 DOA: 11/26/2012  Referring physician: Dr. Rosalia Hammers PCP: Provider Not In System   Chief Complaint: Cough with shortness of breath since 1 day  HPI:  31 year old female with asthma since childhood with off and on exacerbation symptoms (never intubated and frequently on steroids) who was seen at [facility] today for her acute onset shortness of breath with wheezing and fever at home associated with nonproductive cough. She informs going to [facility] 5 days back with symptoms of fever with cough and was told to have a pneumonia on chest x-ray and was discharged on a course of Z-Pak. She felt a little better for a few days however early this morning was again very short of breath with fever of up to 101F and has been having cough with chest congestion . Her symptoms did not improve with inhalers and nebulizers she takes at home. She denies any nausea or vomiting. Denies any chest pain, palpitations, abdominal pain, bowel or urinary symptoms. She also informs having nasal congestion and frontal headache for past few days with the ear stuffiness. She informs her infant to have a viral symptom about one week back in may have contacted the infection from him. In the ED patient was noted to be tachycardic and in mild respiratory distress. She was given IV steroid and nebulizer. With concern for her pneumonia she was given a dose of Levaquin however developed a rash over the IV site and Levaquin was stopped. Her labs showed leukocytosis and mild hypokalemia. Chest x-ray was negative for pneumonia.   Review of Systems: (Positive symptoms in bold) Constitutional:  fever, chills, Denies diaphoresis, appetite change and fatigue.  HEENT: ear pain, congestion,  rhinorrhea, sneezing,Denies photophobia, eye pain, redness, hearing loss,  mouth sores,sore throat, trouble swallowing, neck pain, neck stiffness and  tinnitus.   Respiratory:  positive for SOB, DOE, cough and wheezing., Denies chest tightness,     Cardiovascular: Denies chest pain, palpitations and leg swelling.  Gastrointestinal: Denies nausea, vomiting, abdominal pain, diarrhea, constipation, blood in stool and abdominal distention.  Genitourinary: Denies dysuria, urgency, frequency, hematuria, flank pain and difficulty urinating.  Musculoskeletal: Denies myalgias, back pain, joint swelling, arthralgias and gait problem.  Skin: Denies pallor, rash and wound.  Neurological: Denies dizziness, seizures, syncope, weakness, light-headedness, numbness and headaches.  Hematological: Denies adenopathy. Easy bruising, personal or family bleeding history  Psychiatric/Behavioral: Denies suicidal ideation, mood changes, confusion, nervousness, sleep disturbance and agitation   Past Medical History  Diagnosis Date  . Asthma   . Pneumonia    Past Surgical History  Procedure Date  . Cesarean section 09/27/12   Social History:  reports that she has never smoked. She has never used smokeless tobacco. She reports that she does not drink alcohol or use illicit drugs.  Allergies  Allergen Reactions  . Aspirin Hives  . Levaquin (Levofloxacin In D5w) Hives  . Pamprin (Apap-Pamabrom-Pyrilamine) Hives    Family History  Problem Relation Age of Onset  . Allergies Sister     Prior to Admission medications   Medication Sig Start Date End Date Taking? Authorizing Provider  albuterol (PROVENTIL HFA;VENTOLIN HFA) 108 (90 BASE) MCG/ACT inhaler Inhale 2 puffs into the lungs every 6 (six) hours as needed. For wheezing /shortness of breath   Yes Historical Provider, MD  albuterol (PROVENTIL) (2.5 MG/3ML) 0.083% nebulizer solution Take 2.5 mg by nebulization every 6 (six) hours as needed. For wheezing/shortness of breath  Yes Historical Provider, MD  famotidine (PEPCID) 10 MG tablet Take 10 mg by mouth at bedtime.   Yes Historical Provider, MD   mometasone-formoterol (DULERA) 100-5 MCG/ACT AERO Inhale 2 puffs into the lungs 2 (two) times daily.     Yes Historical Provider, MD  omeprazole (PRILOSEC) 40 MG capsule Take 40 mg by mouth every morning.   Yes Historical Provider, MD    Physical Exam:  Filed Vitals:   11/26/12 1139 11/26/12 1141 11/26/12 1406 11/26/12 1535  BP:  103/47 109/52 113/49  Pulse:  114 107 102  Temp:    98.3 F (36.8 C)  TempSrc:    Oral  Resp:   20 20  Height:    5\' 6"  (1.676 m)  Weight:    104.917 kg (231 lb 4.8 oz)  SpO2: 92%  91% 97%    Constitutional: Vital signs reviewed.  Patient is a well-developed and well-nourished in no acute distress and cooperative with exam. Alert and oriented x3.  Head: Normocephalic and atraumatic, no sinus tenderness Ear: TM normal bilaterally Mouth: no erythema or exudates, MMM,  Eyes: PERRL, EOMI, conjunctivae normal, No scleral icterus.  Neck: Supple, Trachea midline normal ROM, No JVD, mass, thyromegaly, or carotid bruit present.  Cardiovascular: RRR, S1 normal, S2 normal, no MRG, pulses symmetric and intact bilaterally Pulmonary/Chest: Equal breath sounds bilaterally. expiratory  wheeze bilaterally , no crackles Abdominal: Soft. Non-tender, non-distended, bowel sounds are normal, no masses, organomegaly, or guarding present.  GU: no CVA tenderness Musculoskeletal: No joint deformities, erythema, or stiffness, ROM full and no nontender Ext: no edema and no cyanosis, pulses palpable bilaterally (DP and PT) Hematology: no cervical, inginal, or axillary adenopathy.  Neurological: A&O x3, Strenght is normal and symmetric bilaterally, cranial nerve II-XII are grossly intact, no focal motor deficit, sensory intact to light touch bilaterally.  Skin: Warm, dry and intact. No rash, cyanosis, or clubbing.  Psychiatric: Normal mood and affect. speech and behavior is normal. Judgment and thought content normal. Cognition and memory are normal.   Labs on Admission:  Basic  Metabolic Panel:  Lab 11/26/12 1610  NA 140  K 3.3*  CL 102  CO2 26  GLUCOSE 140*  BUN 7  CREATININE 0.70  CALCIUM 9.2  MG --  PHOS --   Liver Function Tests:  Lab 11/26/12 1003  AST 27  ALT 56*  ALKPHOS 105  BILITOT 0.2*  PROT 7.7  ALBUMIN 3.7   No results found for this basename: LIPASE:5,AMYLASE:5 in the last 168 hours No results found for this basename: AMMONIA:5 in the last 168 hours CBC:  Lab 11/26/12 1003  WBC 19.5*  NEUTROABS 17.0*  HGB 12.9  HCT 36.9  MCV 85.4  PLT 347   Cardiac Enzymes: No results found for this basename: CKTOTAL:5,CKMB:5,CKMBINDEX:5,TROPONINI:5 in the last 168 hours BNP: No components found with this basename: POCBNP:5 CBG: No results found for this basename: GLUCAP:5 in the last 168 hours  Radiological Exams on Admission: Dg Chest 2 View  11/26/2012  *RADIOLOGY REPORT*  Clinical Data: Pneumonia  CHEST - 2 VIEW  Comparison: 01/31/2011  Findings: The cardiomediastinal silhouette is stable.  Thoracic dextroscoliosis again noted.  No acute infiltrate or pulmonary edema.  Central bronchitic changes.  No pleural effusion.  IMPRESSION: Thoracic dextroscoliosis again noted.  Central bronchitic changes. No acute infiltrate or pulmonary edema.   Original Report Authenticated By: Natasha Mead, M.D.       Assessment/Plan Principal Problem:  *Acute severe asthma Patient will be admitted to medical  floor. I will start her on IV Solu Medrol 80 mg every 6 hours. I will place her on scheduled albuterol nebs and as needed nebs. -O2 via nasal cannula  -Replenish K., check magnesium and replenish -PPI for GERD prophylaxis -Check FSG and sliding scale insulin while on high-dose steroid.  Fever with URI symptoms Possible for bronchitis. However will check for rapid flu is well. Patient given IV Levaquin in the ED followed by rash and was stopped. I will place her on IV Rocephin and azithromycin. Mucinex for cough. Provide supportive  care  Leucocytosis Patient denies being on steroids recently. Will check blood cultures. Monitor WBC in a.m.   Diet: Regular DVT prophylaxis subcutaneous Lovenox  Code Status: Full code Family Communication: None at bedside Disposition Plan: Home once stable  Eddie North [facility] Pager [phone]  If 7PM-7AM, please contact night-coverage [email or web address] Password [facility] 11/26/2012, 4:24 PM      Total time spent: 70 minutes

## 2012-11-26 NOTE — ED Notes (Addendum)
After apprx. 1/3 of the levaquin 750mg  had been administered, pt's arm became red with multiple hives. Infusion immediately d/c'ed and line flushed with NS. Dr. Rosalia Hammers notified and gave VO for Benadryl 25mg  IV. Redness/hives localized to right arm at IV site. Pt denies difficulty swallowing or increase in SOB. [facility] at bedside and will notify inpt RN of new allergy. Allergies updated in epic.

## 2012-11-26 NOTE — ED Notes (Signed)
MD at bedside. Room Air SpO2 86-88% on monitor.  Patient placed on 2l/m NCO2.  Hr 110, RR16

## 2012-11-26 NOTE — ED Notes (Signed)
She reports fevers of 102 unrelieved by Tylenol.

## 2012-11-26 NOTE — ED Notes (Signed)
Pt informed of plan of care.  Awaiting admission bed assignment.

## 2012-11-27 DIAGNOSIS — R000 Tachycardia, unspecified: Secondary | ICD-10-CM | POA: Clinically undetermined

## 2012-11-27 DIAGNOSIS — E860 Dehydration: Secondary | ICD-10-CM

## 2012-11-27 DIAGNOSIS — D72829 Elevated white blood cell count, unspecified: Secondary | ICD-10-CM | POA: Diagnosis present

## 2012-11-27 LAB — CBC
HCT: 35.9 % — ABNORMAL LOW (ref 36.0–46.0)
Hemoglobin: 12 g/dL (ref 12.0–15.0)
MCH: 29.1 pg (ref 26.0–34.0)
MCV: 86.9 fL (ref 78.0–100.0)
RBC: 4.13 MIL/uL (ref 3.87–5.11)

## 2012-11-27 LAB — URINE MICROSCOPIC-ADD ON

## 2012-11-27 LAB — URINALYSIS, ROUTINE W REFLEX MICROSCOPIC
Bilirubin Urine: NEGATIVE
Nitrite: NEGATIVE
Specific Gravity, Urine: 1.041 — ABNORMAL HIGH (ref 1.005–1.030)
pH: 6 (ref 5.0–8.0)

## 2012-11-27 LAB — BASIC METABOLIC PANEL
CO2: 25 mEq/L (ref 19–32)
Glucose, Bld: 143 mg/dL — ABNORMAL HIGH (ref 70–99)
Potassium: 4.3 mEq/L (ref 3.5–5.1)
Sodium: 142 mEq/L (ref 135–145)

## 2012-11-27 LAB — EXPECTORATED SPUTUM ASSESSMENT W GRAM STAIN, RFLX TO RESP C

## 2012-11-27 LAB — GLUCOSE, CAPILLARY
Glucose-Capillary: 168 mg/dL — ABNORMAL HIGH (ref 70–99)
Glucose-Capillary: 174 mg/dL — ABNORMAL HIGH (ref 70–99)

## 2012-11-27 MED ORDER — LEVALBUTEROL HCL 0.63 MG/3ML IN NEBU
0.6300 mg | INHALATION_SOLUTION | RESPIRATORY_TRACT | Status: DC | PRN
  Administered 2012-11-27 – 2012-11-28 (×2): 0.63 mg via RESPIRATORY_TRACT
  Filled 2012-11-27: qty 3

## 2012-11-27 MED ORDER — GUAIFENESIN ER 600 MG PO TB12
1200.0000 mg | ORAL_TABLET | Freq: Two times a day (BID) | ORAL | Status: DC
  Administered 2012-11-27 – 2012-11-29 (×4): 1200 mg via ORAL
  Filled 2012-11-27 (×5): qty 2

## 2012-11-27 MED ORDER — LEVALBUTEROL TARTRATE 45 MCG/ACT IN AERO
2.0000 | INHALATION_SPRAY | RESPIRATORY_TRACT | Status: DC | PRN
  Filled 2012-11-27: qty 15

## 2012-11-27 MED ORDER — METHYLPREDNISOLONE SODIUM SUCC 125 MG IJ SOLR
80.0000 mg | Freq: Three times a day (TID) | INTRAMUSCULAR | Status: DC
  Administered 2012-11-27 – 2012-11-28 (×2): 80 mg via INTRAVENOUS
  Filled 2012-11-27 (×5): qty 1.28

## 2012-11-27 MED ORDER — SODIUM CHLORIDE 0.9 % IV SOLN
INTRAVENOUS | Status: DC
  Administered 2012-11-27 – 2012-11-28 (×3): via INTRAVENOUS

## 2012-11-27 MED ORDER — LEVALBUTEROL HCL 0.63 MG/3ML IN NEBU
0.6300 mg | INHALATION_SOLUTION | RESPIRATORY_TRACT | Status: DC
  Administered 2012-11-27 – 2012-11-29 (×10): 0.63 mg via RESPIRATORY_TRACT
  Filled 2012-11-27 (×18): qty 3

## 2012-11-27 MED ORDER — LEVALBUTEROL HCL 0.63 MG/3ML IN NEBU
0.6300 mg | INHALATION_SOLUTION | RESPIRATORY_TRACT | Status: DC
  Filled 2012-11-27 (×5): qty 3

## 2012-11-27 NOTE — Progress Notes (Signed)
[facility] PROGRESS NOTE  [name] JYN:[id number] DOB: [birth year] DOA: 11/26/2012 PCP: Provider Not In System  Assessment/Plan:  #1 acute asthma exacerbation Questionable etiology. Likely triggered by bronchitis versus a viral upper respiratory infection. Improving slowly clinically. Patient states has a productive cough now. Will check a sputum Gram stain and culture. We'll decrease IV Solu-Medrol to every 8 hours. Continue empiric IV Rocephin and azithromycin. Change albuterol to Xopenex as patient is tachycardic. Continue Mucinex. Continue oxygen. Follow.  #2 sinus tachycardia Likely secondary to albuterol and problem #1. Change albuterol to Xopenex. Follow.  #3 dehydration IV fluids.  #4 hypokalemia Resolved. We'll remove KCl from IV fluids.  #5 bronchitis Continue IV Rocephin and azithromycin and Mucinex. Oxygen.  #6 leukocytosis Likely secondary to problems #1 of 5 in the setting of steroids. White count is increasing. Will check blood cultures x2. Will check a sputum Gram stain and culture. Analysis is negative. Chest x-ray likely bronchitis. Will continue steroid taper. Continue empiric IV antibiotics. Will follow.  #7 prophylaxis PPI for GI prophylaxis. Lovenox for DVT prophylaxis.  Code Status: Full Family Communication: Updated patient at bedside Disposition Plan: Home when medically stable   Consultants:  None  Procedures:  CXR 11/26/12  Antibiotics:  IV Rocephin 11/26/12  IV Azithromycin 11/26/12  HPI/Subjective: Patient still with productive cough with slight improvement.  Objective: Filed Vitals:   11/27/12 0941 11/27/12 1015 11/27/12 1248 11/27/12 1318  BP:  124/66  112/70  Pulse:  97  111  Temp:  97.3 F (36.3 C)  97.2 F (36.2 C)  TempSrc:      Resp:  20  20  Height:      Weight:      SpO2: 94% 95% 96% 95%    Intake/Output Summary (Last 24 hours) at 11/27/12 1650 Last data filed at 11/27/12 0546  Gross per 24 hour  Intake     360 ml  Output    850 ml  Net   -490 ml   Filed Weights   11/26/12 1535  Weight: 104.917 kg (231 lb 4.8 oz)    Exam:   General:  NAD  Cardiovascular: Tacycardia, no m/r/g  Respiratory: Min - Mild diffuse wheezing  Abdomen: Soft/NT/ND/+BS  Data Reviewed: Basic Metabolic Panel:  Lab 11/27/12 8657 11/26/12 1704 11/26/12 1003  NA 142 -- 140  K 4.3 -- 3.3*  CL 108 -- 102  CO2 25 -- 26  GLUCOSE 143* -- 140*  BUN 8 -- 7  CREATININE 0.57 0.59 0.70  CALCIUM 9.1 -- 9.2  MG -- 2.0 --  PHOS -- -- --   Liver Function Tests:  Lab 11/26/12 1003  AST 27  ALT 56*  ALKPHOS 105  BILITOT 0.2*  PROT 7.7  ALBUMIN 3.7   No results found for this basename: LIPASE:5,AMYLASE:5 in the last 168 hours No results found for this basename: AMMONIA:5 in the last 168 hours CBC:  Lab 11/27/12 0524 11/26/12 1704 11/26/12 1003  WBC 23.2* 18.5* 19.5*  NEUTROABS -- -- 17.0*  HGB 12.0 12.9 12.9  HCT 35.9* 38.6 36.9  MCV 86.9 87.3 85.4  PLT 332 276 347   Cardiac Enzymes: No results found for this basename: CKTOTAL:5,CKMB:5,CKMBINDEX:5,TROPONINI:5 in the last 168 hours BNP (last 3 results) No results found for this basename: PROBNP:3 in the last 8760 hours CBG:  Lab 11/27/12 1216 11/27/12 0800 11/26/12 2224 11/26/12 1707  GLUCAP 168* 136* 239* 157*    No results found for this or any previous visit (from the  past 240 hour(s)).   Studies: Dg Chest 2 View  11/26/2012  *RADIOLOGY REPORT*  Clinical Data: Pneumonia  CHEST - 2 VIEW  Comparison: 01/31/2011  Findings: The cardiomediastinal silhouette is stable.  Thoracic dextroscoliosis again noted.  No acute infiltrate or pulmonary edema.  Central bronchitic changes.  No pleural effusion.  IMPRESSION: Thoracic dextroscoliosis again noted.  Central bronchitic changes. No acute infiltrate or pulmonary edema.   Original Report Authenticated By: Natasha Mead, M.D.     Scheduled Meds:    . azithromycin  500 mg Oral Daily  . cefTRIAXone  (ROCEPHIN)  IV  1 g Intravenous Q24H  . [EXPIRED] diphenhydrAMINE      . enoxaparin (LOVENOX) injection  40 mg Subcutaneous Q24H  . famotidine  10 mg Oral QHS  . guaiFENesin  1,200 mg Oral BID  . insulin aspart  0-9 Units Subcutaneous TID WC  . levalbuterol  0.63 mg Nebulization Q4H  . methylPREDNISolone (SOLU-MEDROL) injection  80 mg Intravenous Q8H  . mometasone-formoterol  2 puff Inhalation BID  . pantoprazole  40 mg Oral Daily  . [COMPLETED] potassium chloride  40 mEq Oral Once  . [DISCONTINUED] albuterol  2.5 mg Nebulization Q4H  . [DISCONTINUED] albuterol  5 mg Nebulization Q4H  . [DISCONTINUED] guaiFENesin  600 mg Oral BID  . [DISCONTINUED] levalbuterol  0.63 mg Nebulization Q4H  . [DISCONTINUED] methylPREDNISolone (SOLU-MEDROL) injection  80 mg Intravenous Q6H   Continuous Infusions:    . sodium chloride 75 mL/hr at 11/27/12 1419  . [DISCONTINUED] 0.9 % NaCl with KCl 20 mEq / L 75 mL/hr at 11/27/12 1610    Principal Problem:  *ASTHMA, WITH ACUTE EXACERBATION Active Problems:  Asthmatic bronchitis  Hypokalemia  URI, acute  Leukocytosis  Dehydration  Sinus tachycardia    Time spent: > 35 mins    [facility]  [facility] Pager [phone]. If 8PM-8AM, please contact night-coverage at [email or web address], password [facility] 11/27/2012, 4:50 PM  LOS: 1 day

## 2012-11-27 NOTE — Progress Notes (Signed)
PT pre/post peak flow was 220/230

## 2012-11-28 ENCOUNTER — Inpatient Hospital Stay (HOSPITAL_COMMUNITY): Payer: BC Managed Care – PPO

## 2012-11-28 DIAGNOSIS — D72829 Elevated white blood cell count, unspecified: Secondary | ICD-10-CM

## 2012-11-28 DIAGNOSIS — I498 Other specified cardiac arrhythmias: Secondary | ICD-10-CM

## 2012-11-28 DIAGNOSIS — J45909 Unspecified asthma, uncomplicated: Secondary | ICD-10-CM

## 2012-11-28 LAB — CBC WITH DIFFERENTIAL/PLATELET
Basophils Relative: 0 % (ref 0–1)
Eosinophils Relative: 0 % (ref 0–5)
Hemoglobin: 11 g/dL — ABNORMAL LOW (ref 12.0–15.0)
Lymphocytes Relative: 5 % — ABNORMAL LOW (ref 12–46)
Neutrophils Relative %: 91 % — ABNORMAL HIGH (ref 43–77)
Platelets: 353 10*3/uL (ref 150–400)
RBC: 3.82 MIL/uL — ABNORMAL LOW (ref 3.87–5.11)
WBC: 28.4 10*3/uL — ABNORMAL HIGH (ref 4.0–10.5)

## 2012-11-28 LAB — URINE CULTURE

## 2012-11-28 LAB — GLUCOSE, CAPILLARY
Glucose-Capillary: 131 mg/dL — ABNORMAL HIGH (ref 70–99)
Glucose-Capillary: 133 mg/dL — ABNORMAL HIGH (ref 70–99)
Glucose-Capillary: 209 mg/dL — ABNORMAL HIGH (ref 70–99)
Glucose-Capillary: 139 mg/dL — ABNORMAL HIGH (ref 70–99)

## 2012-11-28 LAB — BASIC METABOLIC PANEL
GFR calc Af Amer: >90 mL/min (ref >90)
GFR calc non Af Amer: >90 mL/min (ref >90)
Glucose, Bld: 174 mg/dL — ABNORMAL HIGH (ref 70–99)
Potassium: 3.9 mEq/L (ref 3.5–5.1)
Sodium: 142 mEq/L (ref 135–145)

## 2012-11-28 MED ORDER — PREDNISONE 50 MG PO TABS
60.0000 mg | ORAL_TABLET | Freq: Two times a day (BID) | ORAL | Status: DC
  Administered 2012-11-28 – 2012-11-29 (×2): 60 mg via ORAL
  Filled 2012-11-28 (×4): qty 1

## 2012-11-28 MED ORDER — WHITE PETROLATUM GEL
Status: AC
  Filled 2012-11-28: qty 5

## 2012-11-28 NOTE — Progress Notes (Signed)
PT pre/post peak flow 200/250.

## 2012-11-28 NOTE — Progress Notes (Signed)
Pt pre/post peak flow was 150/160

## 2012-11-28 NOTE — Progress Notes (Signed)
[facility] PROGRESS NOTE  [name]:[id number] DOB: [birth year] DOA: 11/26/2012 PCP: Provider Not In System  Assessment/Plan:  #1 acute asthma exacerbation Questionable etiology. Likely triggered by bronchitis versus a viral upper respiratory infection. Improving slowly clinically. Patient states has a productive cough with increased sputum. Sputum Gram stain and culture pending. Change IV Solu-Medrol to oral prednisone 60 mg twice a day. Continue empiric IV Rocephin and azithromycin. Continue Xopenex nebs. Continue Mucinex. Continue oxygen. Follow.  #2 sinus tachycardia Likely secondary to albuterol and problem #1. Improved with change of albuterol to Xopenex. Follow.  #3 dehydration Improved with IV fluids. Saline lock IV fluids.  #4 hypokalemia Resolved.   #5 bronchitis Continue IV Rocephin and azithromycin and Mucinex. Oxygen.  #6 leukocytosis Likely secondary to problems #1  in the setting of steroids. White count is increasing.  Blood cultures x2 pending. Repeat urinalysis is negative. Repeat chest x-ray is negative. Sputum Gram stain and culture pending.  Chest x-ray likely bronchitis. Will continue steroid taper and follow. Continue empiric IV antibiotics. Will follow.  #7 prophylaxis PPI for GI prophylaxis. Lovenox for DVT prophylaxis.  Code Status: Full Family Communication: Updated patient at bedside Disposition Plan: Home when medically stable   Consultants:  None  Procedures:  CXR 11/26/12, 11/28/2012  Antibiotics:  IV Rocephin 11/26/12  IV Azithromycin 11/26/12  HPI/Subjective: Patient still with productive cough. Patient states wheezing has improved. Shortness of breath is improved.  Objective: Filed Vitals:   11/28/12 0002 11/28/12 0618 11/28/12 0630 11/28/12 0846  BP:  121/63    Pulse:  80    Temp:  98.1 F (36.7 C)    TempSrc:  Oral    Resp:  16    Height:      Weight:      SpO2: 98% 93% 95% 96%    Intake/Output Summary  (Last 24 hours) at 11/28/12 1057 Last data filed at 11/28/12 0849  Gross per 24 hour  Intake   2058 ml  Output    400 ml  Net   1658 ml   Filed Weights   11/26/12 1535  Weight: 104.917 kg (231 lb 4.8 oz)    Exam:   General:  NAD  Cardiovascular: RRR no m/r/g  Respiratory: Min diffuse wheezing  Abdomen: Soft/NT/ND/+BS  Data Reviewed: Basic Metabolic Panel:  Lab 11/28/12 9562 11/27/12 0524 11/26/12 1704 11/26/12 1003  NA 142 142 -- 140  K 3.9 4.3 -- 3.3*  CL 107 108 -- 102  CO2 25 25 -- 26  GLUCOSE 174* 143* -- 140*  BUN 10 8 -- 7  CREATININE 0.53 0.57 0.59 0.70  CALCIUM 9.0 9.1 -- 9.2  MG -- -- 2.0 --  PHOS -- -- -- --   Liver Function Tests:  Lab 11/26/12 1003  AST 27  ALT 56*  ALKPHOS 105  BILITOT 0.2*  PROT 7.7  ALBUMIN 3.7   No results found for this basename: LIPASE:5,AMYLASE:5 in the last 168 hours No results found for this basename: AMMONIA:5 in the last 168 hours CBC:  Lab 11/28/12 0500 11/27/12 0524 11/26/12 1704 11/26/12 1003  WBC 28.4* 23.2* 18.5* 19.5*  NEUTROABS 25.9* -- -- 17.0*  HGB 11.0* 12.0 12.9 12.9  HCT 33.5* 35.9* 38.6 36.9  MCV 87.7 86.9 87.3 85.4  PLT 353 332 276 347   Cardiac Enzymes: No results found for this basename: CKTOTAL:5,CKMB:5,CKMBINDEX:5,TROPONINI:5 in the last 168 hours BNP (last 3 results) No results found for this basename: PROBNP:3 in the last 8760 hours CBG:  Lab 11/28/12 0741 11/27/12 2159 11/27/12 1724 11/27/12 1216 11/27/12 0800  GLUCAP 131* 155* 174* 168* 136*    Recent Results (from the past 240 hour(s))  CULTURE, EXPECTORATED SPUTUM-ASSESSMENT     Status: Normal   Collection Time   11/27/12  5:50 PM      Component Value Range Status Comment   Specimen Description SPUTUM   Final    Special Requests NONE   Final    Sputum evaluation     Final    Value: THIS SPECIMEN IS ACCEPTABLE. RESPIRATORY CULTURE REPORT TO FOLLOW.   Report Status 11/27/2012 FINAL   Final      Studies: Dg Chest 2  View  11/28/2012  *RADIOLOGY REPORT*  Clinical Data: Rule out pneumonia  CHEST - 2 VIEW  Comparison: 11/26/2012  Findings: Cardiomediastinal silhouette is stable.  Thoracic dextroscoliosis again noted.  Mild basilar atelectasis.  No infiltrate or pulmonary edema.  Question trace right pleural effusion with blunting of the right costophrenic angle.  IMPRESSION: Thoracic dextroscoliosis again noted.  Mild basilar atelectasis. No infiltrate or pulmonary edema.  Question trace right pleural effusion with blunting of the right costophrenic angle.   Original Report Authenticated By: Natasha Mead, M.D.     Scheduled Meds:    . azithromycin  500 mg Oral Daily  . cefTRIAXone (ROCEPHIN)  IV  1 g Intravenous Q24H  . enoxaparin (LOVENOX) injection  40 mg Subcutaneous Q24H  . famotidine  10 mg Oral QHS  . guaiFENesin  1,200 mg Oral BID  . insulin aspart  0-9 Units Subcutaneous TID WC  . levalbuterol  0.63 mg Nebulization Q4H  . methylPREDNISolone (SOLU-MEDROL) injection  80 mg Intravenous Q8H  . mometasone-formoterol  2 puff Inhalation BID  . pantoprazole  40 mg Oral Daily  . [DISCONTINUED] albuterol  2.5 mg Nebulization Q4H  . [DISCONTINUED] albuterol  5 mg Nebulization Q4H  . [DISCONTINUED] guaiFENesin  600 mg Oral BID  . [DISCONTINUED] levalbuterol  0.63 mg Nebulization Q4H  . [DISCONTINUED] methylPREDNISolone (SOLU-MEDROL) injection  80 mg Intravenous Q6H   Continuous Infusions:    . sodium chloride 125 mL/hr at 11/28/12 0615  . [DISCONTINUED] 0.9 % NaCl with KCl 20 mEq / L 75 mL/hr at 11/27/12 8119    Principal Problem:  *ASTHMA, WITH ACUTE EXACERBATION Active Problems:  Asthmatic bronchitis  Hypokalemia  URI, acute  Leukocytosis  Dehydration  Sinus tachycardia    Time spent: > 35 mins    [facility]  [facility] Pager [phone]. If 8PM-8AM, please contact night-coverage at [email or web address], password [email or web address] 11/28/2012, 10:57 AM  LOS: 2 days

## 2012-11-29 ENCOUNTER — Telehealth: Payer: Self-pay | Admitting: Internal Medicine

## 2012-11-29 LAB — BASIC METABOLIC PANEL
BUN: 10 mg/dL (ref 6–23)
Calcium: 9.2 mg/dL (ref 8.4–10.5)
GFR calc Af Amer: >90 mL/min (ref >90)
GFR calc non Af Amer: >90 mL/min (ref >90)
Glucose, Bld: 152 mg/dL — ABNORMAL HIGH (ref 70–99)

## 2012-11-29 LAB — CBC WITH DIFFERENTIAL/PLATELET
Basophils Absolute: 0 10*3/uL (ref 0.0–0.1)
Basophils Relative: 0 % (ref 0–1)
Eosinophils Absolute: 0 10*3/uL (ref 0.0–0.7)
MCH: 28.4 pg (ref 26.0–34.0)
MCHC: 32.1 g/dL (ref 30.0–36.0)
Monocytes Relative: 4 % (ref 3–12)
Neutro Abs: 21.6 10*3/uL — ABNORMAL HIGH (ref 1.7–7.7)
Neutrophils Relative %: 89 % — ABNORMAL HIGH (ref 43–77)
Platelets: 369 10*3/uL (ref 150–400)
RDW: 14.2 % (ref 11.5–15.5)

## 2012-11-29 LAB — GLUCOSE, CAPILLARY: Glucose-Capillary: 100 mg/dL — ABNORMAL HIGH (ref 70–99)

## 2012-11-29 MED ORDER — PREDNISONE 10 MG PO TABS: ORAL_TABLET | ORAL | Status: DC

## 2012-11-29 MED ORDER — GUAIFENESIN ER 600 MG PO TB12: 1200.0000 mg | ORAL_TABLET | Freq: Two times a day (BID) | ORAL | Status: DC

## 2012-11-29 MED ORDER — LEVALBUTEROL HCL 0.63 MG/3ML IN NEBU: 1.0000 | INHALATION_SOLUTION | RESPIRATORY_TRACT | Status: DC | PRN

## 2012-11-29 MED ORDER — CEFDINIR 300 MG PO CAPS: 300.0000 mg | ORAL_CAPSULE | Freq: Two times a day (BID) | ORAL | Status: DC

## 2012-11-29 MED ORDER — PREDNISONE 50 MG PO TABS
60.0000 mg | ORAL_TABLET | Freq: Every day | ORAL | Status: DC
  Filled 2012-11-29: qty 1

## 2012-11-29 MED ORDER — LEVALBUTEROL TARTRATE 45 MCG/ACT IN AERO: 2.0000 | INHALATION_SPRAY | RESPIRATORY_TRACT | Status: DC | PRN

## 2012-11-29 NOTE — Progress Notes (Signed)
[facility] PROGRESS NOTE  [name] ZOX:[id number] DOB: 1981-04-09 DOA: 11/26/2012 PCP: Salli Quarry, PA  Primary Pulmonologist: Dr. Sandrea Hughs.   HPI:  31 year old female with asthma since childhood with off and on exacerbation symptoms (never intubated and frequently on steroids) who was seen at [facility] on 12/8 for her acute onset shortness of breath with wheezing and fever at home associated with nonproductive cough. She informs going to [facility] 5 days back with symptoms of fever with cough and was told to have a pneumonia on chest x-ray and was discharged on a course of Z-Pak. She felt a little better for a few days however early this morning was again very short of breath with fever of up to 101F and has been having cough with chest congestion . Her symptoms did not improve with inhalers and nebulizers she takes at home. She denies any nausea or vomiting. Denies any chest pain, palpitations, abdominal pain, bowel or urinary symptoms. She also informs having nasal congestion and frontal headache for past few days with the ear stuffiness. She informs her infant to have a viral symptom about one week back in may have contacted the infection from him. In the ED patient was noted to be tachycardic and in mild respiratory distress. She was given IV steroid and nebulizer. With concern for her pneumonia she was given a dose of Levaquin however developed a rash over the IV site and Levaquin was stopped. Her labs showed leukocytosis and mild hypokalemia. Chest x-ray was negative for pneumonia.   Assessment/Plan:  Acute asthma exacerbation  Possibly precipitated by purulent acute bronchitis. Patient indicates that about this time every year she has multiple episodes of asthma flares.  Patient was placed on IV steroids and IV Rocephin and azithromycin (developed allergic reaction to IV Levaquin). She clinically improved and was switched to oral steroids  on 12/10.  Sputum culture is pending. Blood cultures x2 are negative to date.  Patient indicates that she feels much better with no further dyspnea. Still has cough with intermittent Green sputum and streaks of blood (improving). Occasional wheezing.  Oxygen saturations on room air were 86-88%.  Reduce prednisone to 60 mg by mouth daily. Due to mild hypoxia, will monitor additional night with treatment and possibly discharge home on 12/12.   Sinus tachycardia  Likely secondary to albuterol and problem #1. Improved with change of albuterol to Xopenex. Resolved.  Dehydration  Improved with IV fluids. Saline lock IV fluids.  Hypokalemia  Resolved.   Acute Purulent bronchitis  Continue IV Rocephin and azithromycin and Mucinex. Oxygen.  Leukocytosis  Likely secondary to problems #1  in the setting of steroids.   White count is decreasing.    Blood cultures x2 NTD. Repeat urinalysis is negative. Repeat chest x-ray is negative. Sputum Gram stain and culture pending.  Chest x-ray likely bronchitis.   Will continue steroid taper and follow.   Continue empiric IV antibiotics.   Anemia    Prophylaxis PPI for GI prophylaxis. Lovenox for DVT prophylaxis.  Code Status: Full Family Communication: Updated patient at bedside Disposition Plan: Home when medically stable ? 12/12   Consultants:  None  Procedures:  CXR 11/26/12, 11/28/2012  Antibiotics:  IV Rocephin 11/26/12  IV Azithromycin 11/26/12  HPI/Subjective: No further dyspnea. Still has cough with intermittent Green sputum and streaks of blood (improving). Occasional wheezing.  Objective: Filed Vitals:   11/29/12 8119 11/29/12 0852 11/29/12 0912 11/29/12 0913  BP:  Pulse:      Temp:      TempSrc:      Resp:      Height:      Weight:      SpO2: 97% 93% 96% 96%    Intake/Output Summary (Last 24 hours) at 11/29/12 1350 Last data filed at 11/28/12 2100  Gross per 24 hour  Intake   2131 ml  Output       0 ml  Net   2131 ml   Filed Weights   11/26/12 1535  Weight: 104.917 kg (231 lb 4.8 oz)    Exam:   General:  NAD  Cardiovascular: RRR no m/r/g  Respiratory: Occasional wheezing Right mid lung. Rest clear to auscultation. No increased work of breathing.  Abdomen: Soft/NT/ND/+BS  CNS: Alert and oriented. No Focal deficits.  Data Reviewed: Basic Metabolic Panel:  Lab 11/29/12 8295 11/28/12 0500 11/27/12 0524 11/26/12 1704 11/26/12 1003  NA 142 142 142 -- 140  K 4.2 3.9 4.3 -- 3.3*  CL 105 107 108 -- 102  CO2 27 25 25  -- 26  GLUCOSE 152* 174* 143* -- 140*  BUN 10 10 8  -- 7  CREATININE 0.61 0.53 0.57 0.59 0.70  CALCIUM 9.2 9.0 9.1 -- 9.2  MG -- -- -- 2.0 --  PHOS -- -- -- -- --   Liver Function Tests:  Lab 11/26/12 1003  AST 27  ALT 56*  ALKPHOS 105  BILITOT 0.2*  PROT 7.7  ALBUMIN 3.7   No results found for this basename: LIPASE:5,AMYLASE:5 in the last 168 hours No results found for this basename: AMMONIA:5 in the last 168 hours CBC:  Lab 11/29/12 0525 11/28/12 0500 11/27/12 0524 11/26/12 1704 11/26/12 1003  WBC 24.4* 28.4* 23.2* 18.5* 19.5*  NEUTROABS 21.6* 25.9* -- -- 17.0*  HGB 11.7* 11.0* 12.0 12.9 12.9  HCT 36.4 33.5* 35.9* 38.6 36.9  MCV 88.3 87.7 86.9 87.3 85.4  PLT 369 353 332 276 347   Cardiac Enzymes: No results found for this basename: CKTOTAL:5,CKMB:5,CKMBINDEX:5,TROPONINI:5 in the last 168 hours BNP (last 3 results) No results found for this basename: PROBNP:3 in the last 8760 hours CBG:  Lab 11/29/12 1220 11/29/12 0802 11/28/12 2140 11/28/12 1702 11/28/12 1224  GLUCAP 100* 114* 139* 209* 133*    Recent Results (from the past 240 hour(s))  CULTURE, BLOOD (ROUTINE X 2)     Status: Normal (Preliminary result)   Collection Time   11/27/12  2:00 PM      Component Value Range Status Comment   Specimen Description BLOOD LEFT ARM   Final    Special Requests BOTTLES DRAWN AEROBIC ONLY 10CC   Final    Culture  Setup Time 11/27/2012 21:32    Final    Culture     Final    Value:        BLOOD CULTURE RECEIVED NO GROWTH TO DATE CULTURE WILL BE HELD FOR 5 DAYS BEFORE ISSUING A FINAL NEGATIVE REPORT   Report Status PENDING   Incomplete   CULTURE, BLOOD (ROUTINE X 2)     Status: Normal (Preliminary result)   Collection Time   11/27/12  2:00 PM      Component Value Range Status Comment   Specimen Description BLOOD LEFT HAND   Final    Special Requests BOTTLES DRAWN AEROBIC ONLY 10CC   Final    Culture  Setup Time 11/27/2012 21:32   Final    Culture     Final  Value:        BLOOD CULTURE RECEIVED NO GROWTH TO DATE CULTURE WILL BE HELD FOR 5 DAYS BEFORE ISSUING A FINAL NEGATIVE REPORT   Report Status PENDING   Incomplete   URINE CULTURE     Status: Normal   Collection Time   11/27/12  3:40 PM      Component Value Range Status Comment   Specimen Description URINE, CLEAN CATCH   Final    Special Requests NONE   Final    Culture  Setup Time 11/20/2012 16:07   Final    Colony Count NO GROWTH   Final    Culture NO GROWTH   Final    Report Status 11/28/2012 FINAL   Final   CULTURE, EXPECTORATED SPUTUM-ASSESSMENT     Status: Normal   Collection Time   11/27/12  5:50 PM      Component Value Range Status Comment   Specimen Description SPUTUM   Final    Special Requests NONE   Final    Sputum evaluation     Final    Value: THIS SPECIMEN IS ACCEPTABLE. RESPIRATORY CULTURE REPORT TO FOLLOW.   Report Status 11/27/2012 FINAL   Final   CULTURE, RESPIRATORY     Status: Normal (Preliminary result)   Collection Time   11/27/12  5:50 PM      Component Value Range Status Comment   Specimen Description SPUTUM   Final    Special Requests NONE   Final    Gram Stain     Final    Value: RARE WBC PRESENT,BOTH PMN AND MONONUCLEAR     FEW SQUAMOUS EPITHELIAL CELLS PRESENT     FEW GRAM POSITIVE COCCI IN PAIRS     IN CLUSTERS   Culture NORMAL OROPHARYNGEAL FLORA   Final    Report Status PENDING   Incomplete      Studies: Dg Chest 2  View  11/28/2012  *RADIOLOGY REPORT*  Clinical Data: Rule out pneumonia  CHEST - 2 VIEW  Comparison: 11/26/2012  Findings: Cardiomediastinal silhouette is stable.  Thoracic dextroscoliosis again noted.  Mild basilar atelectasis.  No infiltrate or pulmonary edema.  Question trace right pleural effusion with blunting of the right costophrenic angle.  IMPRESSION: Thoracic dextroscoliosis again noted.  Mild basilar atelectasis. No infiltrate or pulmonary edema.  Question trace right pleural effusion with blunting of the right costophrenic angle.   Original Report Authenticated By: Natasha Mead, M.D.     Scheduled Meds:    . azithromycin  500 mg Oral Daily  . cefTRIAXone (ROCEPHIN)  IV  1 g Intravenous Q24H  . enoxaparin (LOVENOX) injection  40 mg Subcutaneous Q24H  . famotidine  10 mg Oral QHS  . guaiFENesin  1,200 mg Oral BID  . insulin aspart  0-9 Units Subcutaneous TID WC  . levalbuterol  0.63 mg Nebulization Q4H  . mometasone-formoterol  2 puff Inhalation BID  . pantoprazole  40 mg Oral Daily  . predniSONE  60 mg Oral Q breakfast  . [EXPIRED] white petrolatum      . [DISCONTINUED] predniSONE  60 mg Oral BID WC   Continuous Infusions:    Principal Problem:  *ASTHMA, WITH ACUTE EXACERBATION Active Problems:  Asthmatic bronchitis  Hypokalemia  URI, acute  Leukocytosis  Dehydration  Sinus tachycardia    Time spent: > 35 mins    [facility]  [facility] Pager [phone]. If 8PM-8AM, please contact night-coverage at [email or web address], password [facility] 11/29/2012, 1:50 PM  LOS: 3 days

## 2012-11-29 NOTE — Progress Notes (Signed)
DC home with dad, verbally understood dc instructions, no questions asked.

## 2012-11-29 NOTE — Discharge Summary (Signed)
Physician Discharge Summary  [name]:[id number] DOB: [birth year] DOA: 11/26/2012  PCP: Salli Quarry, PA  Admit date: 11/26/2012 Discharge date: 11/29/2012  Time spent: Greater than 30 minutes  Recommendations for Outpatient Follow-up:  1. Followup with Dr. Sandrea Hughs on 12/19 at 10:45 AM. 2. With Salli Quarry, PA.  Discharge Diagnoses:  Principal Problem:  *ASTHMA, WITH ACUTE EXACERBATION Active Problems:  Asthmatic bronchitis  Hypokalemia  URI, acute  Leukocytosis  Dehydration  Sinus tachycardia   Discharge Condition: Improved and stable.  Diet recommendation: Heart healthy.  Filed Weights   11/26/12 1535  Weight: 104.917 kg (231 lb 4.8 oz)    History of present illness:  31 year old female with asthma since childhood with off and on exacerbation symptoms (never intubated and frequently on steroids) who was seen at [facility] on 12/8 for her acute onset shortness of breath with wheezing and fever at home associated with nonproductive cough. She informs going to [facility] 5 days back with symptoms of fever with cough and was told to have a pneumonia on chest x-ray and was discharged on a course of Z-Pak. She felt a little better for a few days however early this morning was again very short of breath with fever of up to 101F and has been having cough with chest congestion . Her symptoms did not improve with inhalers and nebulizers she takes at home. She denies any nausea or vomiting. Denies any chest pain, palpitations, abdominal pain, bowel or urinary symptoms. She also informs having nasal congestion and frontal headache for past few days with the ear stuffiness. She informs her infant to have a viral symptom about one week back in may have contacted the infection from him. In the ED patient was noted to be tachycardic and in mild respiratory distress. She was given IV steroid and nebulizer. With concern for her pneumonia she was  given a dose of Levaquin however developed a rash over the IV site and Levaquin was stopped. Her labs showed leukocytosis and mild hypokalemia. Chest x-ray was negative for pneumonia.    Hospital Course:  Acute asthma exacerbation  Possibly precipitated by purulent acute bronchitis. Patient indicates that about this time every year she has multiple episodes of asthma flares.  Patient was placed on IV steroids and IV Rocephin and azithromycin (developed allergic reaction to IV Levaquin). She clinically improved and was switched to oral steroids on 12/10.  Sputum culture is pending. Blood cultures x2 are negative to date.  Patient indicates that she feels much better with no further dyspnea. Still has cough with intermittent Green sputum and streaks of blood (improving). Occasional wheezing.  Discussed with patient's nursing who indicates that patient's oxygen saturations have been in the 90s on room air at rest and with activity. (O2 sats of 86-88 recorded in progress note was an error)  Sinus tachycardia  Likely secondary to albuterol and problem #1. Improved with change of albuterol to Xopenex. Resolved.  Dehydration  Improved with IV fluids. Saline lock IV fluids.  Hypokalemia  Resolved.   Acute Purulent bronchitis  Treated with IV Rocephin and azithromycin in hospital. Patient has been on Z-Pak prior to admission. No further azithromycin. We'll change to additional 2 days of Omnicef to complete 5 days of antibiotic therapy. Patient is not breast-feeding.  Leukocytosis  Likely secondary to problems #1 in the setting of steroids.  White count is decreasing.  Blood cultures x2 NTD. Repeat urinalysis is negative. Repeat chest x-ray is negative. Sputum  Gram stain and culture pending. Chest x-ray likely bronchitis.  Should resolve spontaneously.  Anemia    Consultants:  None  Procedures:  CXR 11/26/12, 11/28/2012  Antibiotics:  IV Rocephin 11/26/12  IV Azithromycin  11/26/12   Discharge Exam:  Complaints No further dyspnea. Still has cough with intermittent Green sputum and streaks of blood (improving). Occasional wheezing.  Filed Vitals:   11/29/12 0912 11/29/12 0913 11/29/12 1450 11/29/12 1534  BP:   123/71   Pulse:   70   Temp:   98.2 F (36.8 C)   TempSrc:   Oral   Resp:   16   Height:      Weight:      SpO2: 96% 96% 98% 95%    General: NAD  Cardiovascular: RRR no m/r/g  Respiratory: Occasional wheezing Right mid lung. Rest clear to auscultation. No increased work of breathing.  Abdomen: Soft/NT/ND/+BS  CNS: Alert and oriented. No Focal deficits.   Discharge Instructions      Discharge Orders    Future Appointments: Provider: Department: Dept Phone: Center:   12/07/2012 10:45 AM Nyoka Cowden, MD [facility] [phone] None     Future Orders Please Complete By Expires   Diet - low sodium heart healthy      Increase activity slowly      Call MD for:  difficulty breathing, headache or visual disturbances          Medication List     As of 11/29/2012  6:34 PM    STOP taking these medications         albuterol (2.5 MG/3ML) 0.083% nebulizer solution   Commonly known as: PROVENTIL      albuterol 108 (90 BASE) MCG/ACT inhaler   Commonly known as: PROVENTIL HFA;VENTOLIN HFA      TAKE these medications         cefdinir 300 MG capsule   Commonly known as: OMNICEF   Take 1 capsule (300 mg total) by mouth 2 (two) times daily.      famotidine 10 MG tablet   Commonly known as: PEPCID   Take 10 mg by mouth at bedtime.      guaiFENesin 600 MG 12 hr tablet   Commonly known as: MUCINEX   Take 2 tablets (1,200 mg total) by mouth 2 (two) times daily.      levalbuterol 0.63 MG/3ML nebulizer solution   Commonly known as: XOPENEX   Take 3 mLs (0.63 mg total) by nebulization every 4 (four) hours as needed for wheezing.      levalbuterol 45 MCG/ACT inhaler   Commonly known as: XOPENEX HFA   Inhale 2 puffs into  the lungs every 4 (four) hours as needed for wheezing.      mometasone-formoterol 100-5 MCG/ACT Aero   Commonly known as: DULERA   Inhale 2 puffs into the lungs 2 (two) times daily.      omeprazole 40 MG capsule   Commonly known as: PRILOSEC   Take 40 mg by mouth every morning.      predniSONE 10 MG tablet   Commonly known as: DELTASONE   5 tablets daily for 2 days, then 4 tablets daily for 2 days, then 3 tablets daily for 2 days, then 2 tablets daily for 2 days, then 1 tablet daily for 2 days, then stop.         Follow-up Information    Follow up with Sandrea Hughs, MD. On 12/07/2012. (10:45 am.)    Contact information:  [address] [place] [address] [phone]       Schedule an appointment as soon as possible for a visit with Salli Quarry, PA.   Contact information:   [address] [place] [address] [phone]           The results of significant diagnostics from this hospitalization (including imaging, microbiology, ancillary and laboratory) are listed below for reference.    Significant Diagnostic Studies: Dg Chest 2 View  11/28/2012  *RADIOLOGY REPORT*  Clinical Data: Rule out pneumonia  CHEST - 2 VIEW  Comparison: 11/26/2012  Findings: Cardiomediastinal silhouette is stable.  Thoracic dextroscoliosis again noted.  Mild basilar atelectasis.  No infiltrate or pulmonary edema.  Question trace right pleural effusion with blunting of the right costophrenic angle.  IMPRESSION: Thoracic dextroscoliosis again noted.  Mild basilar atelectasis. No infiltrate or pulmonary edema.  Question trace right pleural effusion with blunting of the right costophrenic angle.   Original Report Authenticated By: Natasha Mead, M.D.    Dg Chest 2 View  11/26/2012  *RADIOLOGY REPORT*  Clinical Data: Pneumonia  CHEST - 2 VIEW  Comparison: 01/31/2011  Findings: The cardiomediastinal silhouette is stable.  Thoracic dextroscoliosis again noted.  No acute  infiltrate or pulmonary edema.  Central bronchitic changes.  No pleural effusion.  IMPRESSION: Thoracic dextroscoliosis again noted.  Central bronchitic changes. No acute infiltrate or pulmonary edema.   Original Report Authenticated By: Natasha Mead, M.D.     Microbiology: Recent Results (from the past 240 hour(s))  CULTURE, BLOOD (ROUTINE X 2)     Status: Normal (Preliminary result)   Collection Time   11/27/12  2:00 PM      Component Value Range Status Comment   Specimen Description BLOOD LEFT ARM   Final    Special Requests BOTTLES DRAWN AEROBIC ONLY 10CC   Final    Culture  Setup Time 11/27/2012 21:32   Final    Culture     Final    Value:        BLOOD CULTURE RECEIVED NO GROWTH TO DATE CULTURE WILL BE HELD FOR 5 DAYS BEFORE ISSUING A FINAL NEGATIVE REPORT   Report Status PENDING   Incomplete   CULTURE, BLOOD (ROUTINE X 2)     Status: Normal (Preliminary result)   Collection Time   11/27/12  2:00 PM      Component Value Range Status Comment   Specimen Description BLOOD LEFT HAND   Final    Special Requests BOTTLES DRAWN AEROBIC ONLY 10CC   Final    Culture  Setup Time 11/27/2012 21:32   Final    Culture     Final    Value:        BLOOD CULTURE RECEIVED NO GROWTH TO DATE CULTURE WILL BE HELD FOR 5 DAYS BEFORE ISSUING A FINAL NEGATIVE REPORT   Report Status PENDING   Incomplete   URINE CULTURE     Status: Normal   Collection Time   11/27/12  3:40 PM      Component Value Range Status Comment   Specimen Description URINE, CLEAN CATCH   Final    Special Requests NONE   Final    Culture  Setup Time 11/20/2012 16:07   Final    Colony Count NO GROWTH   Final    Culture NO GROWTH   Final    Report Status 11/28/2012 FINAL   Final   CULTURE, EXPECTORATED SPUTUM-ASSESSMENT     Status:  Normal   Collection Time   11/27/12  5:50 PM      Component Value Range Status Comment   Specimen Description SPUTUM   Final    Special Requests NONE   Final    Sputum evaluation     Final    Value: THIS  SPECIMEN IS ACCEPTABLE. RESPIRATORY CULTURE REPORT TO FOLLOW.   Report Status 11/27/2012 FINAL   Final   CULTURE, RESPIRATORY     Status: Normal (Preliminary result)   Collection Time   11/27/12  5:50 PM      Component Value Range Status Comment   Specimen Description SPUTUM   Final    Special Requests NONE   Final    Gram Stain     Final    Value: RARE WBC PRESENT,BOTH PMN AND MONONUCLEAR     FEW SQUAMOUS EPITHELIAL CELLS PRESENT     FEW GRAM POSITIVE COCCI IN PAIRS     IN CLUSTERS   Culture NORMAL OROPHARYNGEAL FLORA   Final    Report Status PENDING   Incomplete      Labs: Basic Metabolic Panel:  Lab 11/29/12 1610 11/28/12 0500 11/27/12 0524 11/26/12 1704 11/26/12 1003  NA 142 142 142 -- 140  K 4.2 3.9 4.3 -- 3.3*  CL 105 107 108 -- 102  CO2 27 25 25  -- 26  GLUCOSE 152* 174* 143* -- 140*  BUN 10 10 8  -- 7  CREATININE 0.61 0.53 0.57 0.59 0.70  CALCIUM 9.2 9.0 9.1 -- 9.2  MG -- -- -- 2.0 --  PHOS -- -- -- -- --   Liver Function Tests:  Lab 11/26/12 1003  AST 27  ALT 56*  ALKPHOS 105  BILITOT 0.2*  PROT 7.7  ALBUMIN 3.7   No results found for this basename: LIPASE:5,AMYLASE:5 in the last 168 hours No results found for this basename: AMMONIA:5 in the last 168 hours CBC:  Lab 11/29/12 0525 11/28/12 0500 11/27/12 0524 11/26/12 1704 11/26/12 1003  WBC 24.4* 28.4* 23.2* 18.5* 19.5*  NEUTROABS 21.6* 25.9* -- -- 17.0*  HGB 11.7* 11.0* 12.0 12.9 12.9  HCT 36.4 33.5* 35.9* 38.6 36.9  MCV 88.3 87.7 86.9 87.3 85.4  PLT 369 353 332 276 347   Cardiac Enzymes: No results found for this basename: CKTOTAL:5,CKMB:5,CKMBINDEX:5,TROPONINI:5 in the last 168 hours BNP: BNP (last 3 results) No results found for this basename: PROBNP:3 in the last 8760 hours CBG:  Lab 11/29/12 1220 11/29/12 0802 11/28/12 2140 11/28/12 1702 11/28/12 1224  GLUCAP 100* 114* 139* 209* 133*    Urine pregnancy test: Negative Influenza panel: Negative  Signed:  HONGALGI,ANAND  [facility] 11/29/2012, 6:34 PM

## 2012-11-29 NOTE — Telephone Encounter (Signed)
i spoke with pt and she was calling to scheduled HFU with MW 12/07/12 at 10:45.

## 2012-11-30 LAB — CULTURE, RESPIRATORY W GRAM STAIN: Culture: NORMAL

## 2012-12-03 LAB — CULTURE, BLOOD (ROUTINE X 2)

## 2012-12-06 ENCOUNTER — Ambulatory Visit (INDEPENDENT_AMBULATORY_CARE_PROVIDER_SITE_OTHER): Payer: BC Managed Care – PPO | Admitting: Internal Medicine

## 2012-12-06 ENCOUNTER — Encounter: Payer: Self-pay | Admitting: Internal Medicine

## 2012-12-06 VITALS — BP 100/70 | HR 78 | Temp 98.4°F | Ht 66.0 in | Wt 234.0 lb

## 2012-12-06 DIAGNOSIS — J45909 Unspecified asthma, uncomplicated: Secondary | ICD-10-CM

## 2012-12-06 MED ORDER — FAMOTIDINE 20 MG PO TABS: ORAL_TABLET | ORAL | Status: DC

## 2012-12-06 NOTE — Progress Notes (Signed)
Subjective:    Patient ID: [name], female    DOB: October 08, 1981   MRN: [id number]  HPI  31 yowf dx with asthma age 31 with good control including nl pregnancy while on advair 250 in 2007    08/17/2011 Initial pulmonary office eval in EMR era cc asthma worse since 2009 p mold exposure thru Feb 2012 when left that  House> moved to [place] p admitted to [facility] Feb 2012 and discharged initially on advair for did ok for  several weeks while on prednisone then downhill since > sev ER trips and has developed neb dependence -Tried symbicort and dulera through urgent.  Took 2 puffs of dulera am of ov but still needed neb 2hours later. rec Dulera 100 Take 2 puffs first thing in am and then another 2 puffs about 12 hours later.  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 Followup with Dr. Sandrea Hughs on 12/19 at 10:45 AM. 2. With Salli Quarry, PA. Discharge Diagnoses:  Principal Problem:  *ASTHMA, WITH ACUTE EXACERBATION  Active Problems:  Asthmatic bronchitis  Hypokalemia  URI, acute  Leukocytosis  Dehydration  Sinus tachycardia  12/06/2012 post hosp f/u ov/Wert  back to baseline x still using too much saba "the way she was in the hospital" with minimal residual nasal and ear congestion, no purulent sputum, almost all done with prednisone.  Sleeping ok without nocturnal  or early am exacerbation  of respiratory  c/o's or need for noct saba. Also denies any obvious fluctuation of symptoms with weather or environmental  changes or other aggravating or alleviating factors except as outlined above   ROS  The following are not active complaints unless bolded sore throat, dysphagia, dental problems, itching, sneezing,  nasal congestion or excess/ purulent secretions, ear ache,   fever, chills, sweats, unintended wt loss, pleuritic or exertional cp, hemoptysis,  orthopnea pnd or leg swelling, presyncope, palpitations, heartburn, abdominal pain, anorexia, nausea, vomiting, diarrhea  or change in bowel or urinary habits, change in stools or urine, dysuria,hematuria,  rash, arthralgias, visual complaints, headache, numbness weakness or ataxia or problems with walking or coordination,  change in mood/affect or memory.              Objective:   Physical Exam  08/17/2011  245 > 234 12/06/2012   Anxious amb wf nad with trace pseudowheeze  HEENT: nl dentition, turbinates, and orophanx. Nl external ear canals without cough reflex   NECK :  without JVD/Nodes/TM/ nl carotid upstrokes bilaterally   LUNGS: no acc muscle use, clear to A and P bilaterally without cough on insp or exp maneuvers   CV:  RRR  no s3 or murmur or increase in P2, no edema   ABD:  soft and nontender with nl excursion in the supine position. No bruits or organomegaly, bowel sounds nl  MS:  warm without deformities, calf tenderness, cyanosis or clubbing  SKIN: warm and  dry without lesions     cxr 11/28/12 Thoracic dextroscoliosis again noted. Mild basilar atelectasis.  No infiltrate or pulmonary edema. Question trace right pleural  effusion with blunting of the right costophrenic angle.         Assessment & Plan:

## 2012-12-06 NOTE — Assessment & Plan Note (Addendum)
-   HFA 75% p coaching 08/17/2011 > 90% 12/06/2012   The proper method of use, as well as anticipated side effects, of a metered-dose inhaler are discussed and demonstrated to the patient. Improved effectiveness after extensive coaching during this visit to a level of approximately  90%    I had an extended discussion with the patient today lasting 15 to 20 minutes of a 25 minute visit on the following issues:  The problem is she's not using an adequate action plan for acute exacerbations which appear more viral than allergic with way too much use of saba and the ER  rec  See instructions for specific recommendations which were reviewed directly with the patient who was given a copy with highlighter outlining the key components.   F/u with pft's next step

## 2012-12-06 NOTE — Patient Instructions (Addendum)
Prilosec(omeprazole)  40 mg  Take 30-60 min before first meal of the day and Pepcid 20 mg one bedtime.  Plan A Dulera 100 Take 2 puffs first thing in am and then another 2 puffs about 12 hours later.   Only use your albuterol (xopenex inhaler Plan B,  xopenex neb is plan C)  as a rescue medication to be used if you can't catch your breath by resting or doing a relaxed purse lip breathing pattern. The less you use it, the better it will work when you need it.   GERD (REFLUX)  is an extremely common cause of respiratory symptoms, many times with no significant heartburn at all.    It can be treated with medication, but also with lifestyle changes including avoidance of late meals, excessive alcohol, smoking cessation, and avoid fatty foods, chocolate, peppermint, colas, red wine, and acidic juices such as orange juice.  NO MINT OR MENTHOL PRODUCTS SO NO COUGH DROPS  USE SUGARLESS CANDY INSTEAD (jolley ranchers or Stover's)  NO OIL BASED VITAMINS - use powdered substitutes.    Please schedule a follow up office visit in 6 weeks, call sooner if needed with pfts

## 2012-12-07 ENCOUNTER — Inpatient Hospital Stay: Payer: BC Managed Care – PPO | Admitting: Internal Medicine

## 2012-12-25 ENCOUNTER — Ambulatory Visit (INDEPENDENT_AMBULATORY_CARE_PROVIDER_SITE_OTHER): Payer: BC Managed Care – PPO | Admitting: Internal Medicine

## 2012-12-25 ENCOUNTER — Encounter: Payer: Self-pay | Admitting: Internal Medicine

## 2012-12-25 ENCOUNTER — Other Ambulatory Visit (INDEPENDENT_AMBULATORY_CARE_PROVIDER_SITE_OTHER): Payer: BC Managed Care – PPO

## 2012-12-25 ENCOUNTER — Telehealth: Payer: Self-pay | Admitting: Internal Medicine

## 2012-12-25 VITALS — BP 104/68 | HR 113 | Temp 97.4°F | Ht 66.0 in | Wt 234.0 lb

## 2012-12-25 DIAGNOSIS — J45909 Unspecified asthma, uncomplicated: Secondary | ICD-10-CM

## 2012-12-25 LAB — CBC WITH DIFFERENTIAL/PLATELET
Basophils Relative: 0.4 % (ref 0.0–3.0)
Eosinophils Absolute: 0.8 10*3/uL — ABNORMAL HIGH (ref 0.0–0.7)
Eosinophils Relative: 10 % — ABNORMAL HIGH (ref 0.0–5.0)
Lymphocytes Relative: 19.9 % (ref 12.0–46.0)
MCHC: 33.8 g/dL (ref 30.0–36.0)
Monocytes Relative: 9.1 % (ref 3.0–12.0)
Neutrophils Relative %: 60.6 % (ref 43.0–77.0)
RBC: 4.41 Mil/uL (ref 3.87–5.11)
WBC: 8.1 10*3/uL (ref 4.5–10.5)

## 2012-12-25 MED ORDER — MOMETASONE FURO-FORMOTEROL FUM 200-5 MCG/ACT IN AERO: INHALATION_SPRAY | RESPIRATORY_TRACT | Status: DC

## 2012-12-25 MED ORDER — PREDNISONE (PAK) 10 MG PO TABS: ORAL_TABLET | ORAL | Status: DC

## 2012-12-25 MED ORDER — AMOXICILLIN-POT CLAVULANATE 875-125 MG PO TABS: 1.0000 | ORAL_TABLET | Freq: Two times a day (BID) | ORAL | Status: DC

## 2012-12-25 NOTE — Telephone Encounter (Signed)
Per TD, pt can be worked in today with Dr. Sherene Sires.   Called, spoke with pt.  We have scheduled her today to see Dr. Sherene Sires at 11:15 am -- pt aware.

## 2012-12-25 NOTE — Patient Instructions (Addendum)
Augmentin 875 twice daily x 10 days and if head congestion not resolved completely need a sinus CT > [name]  Plan A:  Dulera 200 Take 2 puffs first thing in am and then another 2 puffs about 12 hours later.   Only use your albuterol (xopenex inhaler Plan B,  xopenex neb is plan C)  as a rescue medication to be used if you can't catch your breath by resting or doing a relaxed purse lip breathing pattern. The less you use it, the better it will work when you need it, ok to use   Please remember to go to the lab department downstairs for your tests - we will call you with the results when they are available.    Keep previous appt

## 2012-12-25 NOTE — Telephone Encounter (Signed)
Called, spoke with pt who states on Friday evening she started to have a prod coughw ith yellowish green mucus, wheezing, increased SOB with minimal activity, chest tightness, sweats, and vomiting at times with cough.  Temp up to 100.  Has been needing to use xopenex hfa q4h and xopenex neb q5-6h.  She is requesting OV today or tomorrow -- doesn't want to end back up in the hospital.  States these are same symptoms she had when she did end up in the hospital.  There are no openings in office today.  We have scheduled pt to see [facility] tomorrow, Jan 7 at 9:45 am.  Pt would like to know if there are any recs she could do in the meantime.  Dr. Sherene Sires, pls advise.  Thank you.  [facility] [address] [place]  Last OV with MW 11/26/12  Allergies  Allergen Reactions  . Aspirin Hives  . Levaquin (Levofloxacin In D5w) Hives  . Pamprin (Apap-Pamabrom-Pyrilamine) Hives

## 2012-12-25 NOTE — Telephone Encounter (Signed)
Just bring her in today at noon and I'll see her

## 2012-12-25 NOTE — Progress Notes (Signed)
Subjective:    Patient ID: [name], female    DOB: 1981-12-14   MRN: [id number]  HPI  32 yowf dx with asthma age 32 with good control including nl pregnancy while on advair 250 in 2007    08/17/2011 Initial pulmonary office eval in EMR era cc asthma worse since 2009 p mold exposure thru Feb 2012 when left that  House> moved to [place] p admitted to [facility] Feb 2012 and discharged initially on advair for did ok for  several weeks while on prednisone then downhill since > sev ER trips and has developed neb dependence -Tried symbicort and dulera through urgent.  Took 2 puffs of dulera am of ov but still needed neb 2hours later. rec Dulera 100 Take 2 puffs first thing in am and then another 2 puffs about 12 hours later.  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 Followup with Dr. Sandrea Stanton on 12/19 at 10:45 AM. 2. With Nicole Quarry, PA. Discharge Diagnoses:  Principal Problem:  *ASTHMA, WITH ACUTE EXACERBATION  Active Problems:  Asthmatic bronchitis  Hypokalemia  URI, acute  Leukocytosis  Dehydration  Sinus tachycardia  12/06/2012 post hosp f/u ov/[name]  back to baseline x still using too much saba "the way she was in the hospital" with minimal residual nasal and ear congestion, no purulent sputum, almost all done with prednisone. rec Plan A Dulera 100 Take 2 puffs first thing in am and then another 2 puffs about 12 hours later.  Only use your albuterol (xopenex inhaler Plan B,  xopenex neb is plan C)  as a  rescue medication  GERD diet  12/25/2012 f/u ov/[name] cc some better then  C/o gradual increased SOB and wheezing x 2 wks, worse for the past couple days. Still has prod cough with dark green sputum since the last visit here. In retrospect never cleared nasal congestion and not using xopenex at all then started using hfa daytime and neb at night.   At baseline sleeping ok without nocturnal  or early am exacerbation  of respiratory  c/o's or need for noct saba. Also denies any obvious fluctuation of symptoms with weather or environmental changes or other aggravating or alleviating factors except as outlined above   ROS  The following are not active complaints unless bolded sore throat, dysphagia, dental problems, itching, sneezing,  nasal congestion or excess/ purulent secretions, ear ache,   fever, chills, sweats, unintended wt loss, pleuritic or exertional cp, hemoptysis,  orthopnea pnd or leg swelling, presyncope, palpitations, heartburn, abdominal pain, anorexia, nausea, vomiting, diarrhea  or change in bowel or urinary habits, change in stools or urine, dysuria,hematuria,  rash, arthralgias, visual complaints, headache, numbness weakness or ataxia or problems with walking or coordination,  change in mood/affect or memory.              Objective:   Physical Exam  08/17/2011  245 > 234 12/06/2012  >  234 12/25/2012   Anxious amb wf nad with trace pseudowheeze  HEENT: nl dentition, turbinates, and orophanx. Nl external ear canals without cough reflex   NECK :  without JVD/Nodes/TM/ nl carotid upstrokes bilaterally   LUNGS: congested sounding cough and sonorous bilateral exp rhonchi with minimal increased Texp     CV:  RRR  no s3 or murmur or increase in P2, no edema   ABD:  soft and nontender with nl excursion in the supine position. No bruits or organomegaly, bowel sounds nl  MS:  warm without deformities, calf tenderness, cyanosis or clubbing  SKIN: warm and dry without lesions      cxr 11/28/12 Thoracic dextroscoliosis again noted. Mild basilar atelectasis.  No infiltrate or pulmonary edema. Question trace right pleural  effusion with blunting of the right costophrenic angle.         Assessment & Plan:

## 2012-12-26 ENCOUNTER — Ambulatory Visit: Payer: BC Managed Care – PPO | Admitting: Pulmonary Disease

## 2012-12-26 ENCOUNTER — Encounter: Payer: Self-pay | Admitting: Internal Medicine

## 2012-12-26 LAB — ALLERGY PROFILE REGION II-DC, DE, MD, ~~LOC~~, VA
Bermuda Grass: <0.1 kU/L
Cladosporium Herbarum: <0.1 kU/L
Cockroach: <0.1 kU/L
Common Ragweed: <0.1 kU/L
IgE (Immunoglobulin E), Serum: 225.5 IU/mL — ABNORMAL HIGH (ref 0.0–180.0)
Johnson Grass: 0.82 kU/L — ABNORMAL HIGH
Lamb's Quarters: <0.1 kU/L
Meadow Grass: 1.59 kU/L — ABNORMAL HIGH

## 2012-12-26 NOTE — Assessment & Plan Note (Signed)
-   HFA 90% 12/25/2012   - Allergy profile   12/25/2012 > Eos 10%,  Ig E  222   DDX of  difficult airways managment all start with A and  include Adherence, Ace Inhibitors, Acid Reflux, Active Sinus Disease, Alpha 1 Antitripsin deficiency, Anxiety masquerading as Airways dz,  ABPA,  allergy(esp in young), Aspiration (esp in elderly), Adverse effects of DPI,  Active smokers, plus two Bs  = Bronchiectasis and Beta blocker use..and one C= CHF   Adherence is always the initial "prime suspect" and is a multilayered concern that requires a "trust but verify" approach in every patient - starting with knowing how to use medications, especially inhalers, correctly, keeping up with refills and understanding the fundamental difference between maintenance and prns vs those medications only taken for a very short course and then stopped and not refilled. The proper method of use, as well as anticipated side effects, of a metered-dose inhaler are discussed and demonstrated to the patient. Improved effectiveness after extensive coaching during this visit to a level of approximately  90% so try dulera 200 2bid  ? Active/ acute sinus dz > augmentin then consider sinus ct  ? Allergy > allergy profile sent

## 2012-12-26 NOTE — Progress Notes (Signed)
Quick Note:  Spoke with pt and notified of results per Dr. Wert. Pt verbalized understanding and denied any questions.  ______ 

## 2013-01-22 ENCOUNTER — Ambulatory Visit: Payer: BC Managed Care – PPO | Admitting: Internal Medicine

## 2013-01-29 ENCOUNTER — Ambulatory Visit (INDEPENDENT_AMBULATORY_CARE_PROVIDER_SITE_OTHER): Payer: BC Managed Care – PPO | Admitting: Internal Medicine

## 2013-01-29 ENCOUNTER — Encounter: Payer: Self-pay | Admitting: Internal Medicine

## 2013-01-29 VITALS — BP 110/78 | HR 76 | Temp 98.2°F | Ht 66.0 in | Wt 236.0 lb

## 2013-01-29 DIAGNOSIS — J45909 Unspecified asthma, uncomplicated: Secondary | ICD-10-CM

## 2013-01-29 LAB — PULMONARY FUNCTION TEST

## 2013-01-29 MED ORDER — ALBUTEROL SULFATE HFA 108 (90 BASE) MCG/ACT IN AERS: 2.0000 | INHALATION_SPRAY | Freq: Four times a day (QID) | RESPIRATORY_TRACT | Status: DC | PRN

## 2013-01-29 MED ORDER — MOMETASONE FURO-FORMOTEROL FUM 200-5 MCG/ACT IN AERO: INHALATION_SPRAY | RESPIRATORY_TRACT | Status: DC

## 2013-01-29 MED ORDER — MONTELUKAST SODIUM 10 MG PO TABS: ORAL_TABLET | ORAL | Status: DC

## 2013-01-29 NOTE — Progress Notes (Signed)
PFT done today. 

## 2013-01-29 NOTE — Progress Notes (Signed)
Subjective:    Patient ID: [name], female    DOB: [birth year]   MRN: [id number]    Brief patient profile:  32 yowf dx with asthma age 32 with good control including nl pregnancy while on advair 250 in 2007    08/17/2011 Initial pulmonary office eval in EMR era cc asthma worse since 2009 p mold exposure thru Feb 2012 when left that  House> moved to [place] p admitted to [facility] Feb 2012 and discharged initially on advair for did ok for  several weeks while on prednisone then downhill since > sev ER trips and has developed neb dependence -Tried symbicort and dulera through urgent.  Took 2 puffs of dulera am of ov but still needed neb 2hours later. rec Dulera 100 Take 2 puffs first thing in am and then another 2 puffs about 12 hours later.  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 Followup with Dr. Sandrea Hughs on 12/19 at 10:45 AM. 2. With Salli Quarry, PA. Discharge Diagnoses:  Principal Problem:  *ASTHMA, WITH ACUTE EXACERBATION  Active Problems:  Asthmatic bronchitis  Hypokalemia  URI, acute  Leukocytosis  Dehydration  Sinus tachycardia  12/06/2012 post hosp f/u ov/Alvita Fana  back to baseline x still using too much saba "the way she was in the hospital" with minimal residual nasal and ear congestion, no purulent sputum, almost all done with prednisone. rec Plan A Dulera 100 Take 2 puffs first thing in am and then another 2 puffs about 12 hours later.  Only use your albuterol (xopenex inhaler Plan B,  xopenex neb  is plan C)  as a rescue medication  GERD diet  12/25/2012 f/u ov/Adreanne Yono cc some better then  C/o gradual increased SOB and wheezing x 2 wks, worse for the past couple days. Still has prod cough with dark green sputum since the last visit here. In retrospect never cleared nasal congestion and not using xopenex at all then started using hfa daytime and neb at night.  rec Augmentin 875 twice daily x 10 days and if head congestion not resolved completely need a sinus CT > [name] Plan A:  Dulera 200 Take 2 puffs first thing in am and then another 2 puffs about 12 hours later.  Only use your albuterol (xopenex inhaler Plan B,  xopenex neb is plan C)    01/29/2013 f/u ov/Leatha Rohner cc never got CT, never got completely bettter with nasal congestion but much better vs last ov and very little need for saba.  Did not call for ct sinus as rec.  At baseline sleeping ok without nocturnal  or early am exacerbation  of respiratory  c/o's or need for noct saba. Also denies any obvious fluctuation of symptoms with weather or environmental changes or other aggravating or alleviating factors except as outlined above   ROS  The following are not active complaints unless bolded sore throat, dysphagia, dental problems,  itching, sneezing,  nasal congestion or excess/ purulent secretions, ear ache,   fever, chills, sweats, unintended wt loss, pleuritic or exertional cp, hemoptysis,  orthopnea pnd or leg swelling, presyncope, palpitations, heartburn, abdominal pain, anorexia, nausea, vomiting, diarrhea  or change in bowel or urinary habits, change in stools or urine, dysuria,hematuria,  rash, arthralgias, visual complaints, headache, numbness weakness or ataxia or problems with walking or coordination,  change in mood/affect or memory.              Objective:   Physical Exam  08/17/2011  245 > 234 12/06/2012  > 234 12/25/2012 > 236   Anxious amb wf nad    HEENT: nl dentition, turbinates, and orophanx. Nl external  ear canals without cough reflex   NECK :  without JVD/Nodes/TM/ nl carotid upstrokes bilaterally   LUNGS: congested sounding cough and sonorous bilateral exp rhonchi with minimal increased Texp     CV:  RRR  no s3 or murmur or increase in P2, no edema   ABD:  soft and nontender with nl excursion in the supine position. No bruits or organomegaly, bowel sounds nl  MS:  warm without deformities, calf tenderness, cyanosis or clubbing  SKIN: warm and dry without lesions     cxr 11/28/12 Thoracic dextroscoliosis again noted. Mild basilar atelectasis.  No infiltrate or pulmonary edema. Question trace right pleural  effusion with blunting of the right costophrenic angle.         Assessment & Plan:

## 2013-01-29 NOTE — Assessment & Plan Note (Signed)
-   HFA 90% 12/25/2012   - Allergy profile   12/25/2012 > Eos 10%,  Ig E  222 Pos dogs/ cats/grass  DDX of  difficult airways managment all start with A and  include Adherence, Ace Inhibitors, Acid Reflux, Active Sinus Disease, Alpha 1 Antitripsin deficiency, Anxiety masquerading as Airways dz,  ABPA,  allergy(esp in young), Aspiration (esp in elderly), Adverse effects of DPI,  Active smokers, plus two Bs  = Bronchiectasis and Beta blocker use..and one C= CHF  Adherence is always the initial "prime suspect" and is a multilayered concern that requires a "trust but verify" approach in every patient - starting with knowing how to use medications, especially inhalers, correctly, keeping up with refills and understanding the fundamental difference between maintenance and prns vs those medications only taken for a very short course and then stopped and not refilled.   ? Allergy > start singulair  ? Active sinus dz > call for sinus ct if not better to her satisfaction on singulair

## 2013-01-29 NOTE — Patient Instructions (Addendum)
singulair 10 mg one each pm  if head congestion not resolved completely need a sinus CT > [name]  Plan A:  Dulera 200 Take 2 puffs first thing in am and then another 2 puffs about 12 hours later.   Only use your albuterol (xopenex inhaler Plan B,  xopenex neb is plan C)  as a rescue medication to be used if you can't catch your breath by resting or doing a relaxed purse lip breathing pattern. The less you use it, the better it will work when you need it, ok to use   Please schedule a follow up visit in 3 months but call sooner if needed

## 2013-02-07 ENCOUNTER — Encounter: Payer: Self-pay | Admitting: Internal Medicine

## 2013-03-23 ENCOUNTER — Telehealth: Payer: Self-pay | Admitting: Internal Medicine

## 2013-03-23 MED ORDER — LEVALBUTEROL TARTRATE 45 MCG/ACT IN AERO: 2.0000 | INHALATION_SPRAY | RESPIRATORY_TRACT | Status: DC | PRN

## 2013-03-23 MED ORDER — MOMETASONE FURO-FORMOTEROL FUM 200-5 MCG/ACT IN AERO: INHALATION_SPRAY | RESPIRATORY_TRACT | Status: DC

## 2013-03-23 NOTE — Telephone Encounter (Signed)
Spoke to pt. Needs Dulera and Xopenex inhalers to go to [facility].  Rx's have been sent. Samples will be placed up front for her to pick up to last her until she gets them in the mail.

## 2013-04-04 ENCOUNTER — Telehealth: Payer: Self-pay | Admitting: Internal Medicine

## 2013-04-04 MED ORDER — ALBUTEROL SULFATE HFA 108 (90 BASE) MCG/ACT IN AERS: 2.0000 | INHALATION_SPRAY | Freq: Four times a day (QID) | RESPIRATORY_TRACT | Status: DC | PRN

## 2013-04-04 NOTE — Telephone Encounter (Signed)
[facility]oke with [facility] xopenex inhaler is not covered Per verbal by  SN ok to give her Proair rx sent  fyi to DR Sherene Sires

## 2013-05-21 ENCOUNTER — Ambulatory Visit: Payer: BC Managed Care – PPO | Admitting: Internal Medicine

## 2013-05-29 ENCOUNTER — Telehealth: Payer: Self-pay | Admitting: Internal Medicine

## 2013-05-29 ENCOUNTER — Ambulatory Visit: Payer: BC Managed Care – PPO | Admitting: Internal Medicine

## 2013-05-29 NOTE — Telephone Encounter (Signed)
lmomtcb  

## 2013-05-30 NOTE — Telephone Encounter (Signed)
lmomtcb  

## 2013-05-30 NOTE — Telephone Encounter (Signed)
Pt returned call & asked to be reached at [phone]  Antionette Fairy

## 2013-05-31 NOTE — Telephone Encounter (Signed)
Pt returned call. [name] °

## 2013-05-31 NOTE — Telephone Encounter (Signed)
[id number] x 1 

## 2013-05-31 NOTE — Telephone Encounter (Signed)
Sample is up front for pick up. Pt is aware. 

## 2013-06-25 ENCOUNTER — Encounter: Payer: Self-pay | Admitting: Internal Medicine

## 2013-06-25 ENCOUNTER — Ambulatory Visit (INDEPENDENT_AMBULATORY_CARE_PROVIDER_SITE_OTHER): Payer: BC Managed Care – PPO | Admitting: Internal Medicine

## 2013-06-25 VITALS — BP 120/80 | HR 81 | Temp 97.9°F | Ht 65.5 in | Wt 248.0 lb

## 2013-06-25 DIAGNOSIS — J45901 Unspecified asthma with (acute) exacerbation: Secondary | ICD-10-CM

## 2013-06-25 NOTE — Progress Notes (Signed)
Subjective:    Patient ID: [name], female    DOB: June 14, 1981   MRN: [id number]    Brief patient profile:  32 yowf dx with asthma age 32 with good control including nl pregnancy while on advair 250 in 2007    08/17/2011 Initial pulmonary office eval in EMR era cc asthma worse since 2009 p mold exposure thru Feb 2012 when left that  House> moved to [place] p admitted to [facility] Feb 2012 and discharged initially on advair for did ok for  several weeks while on prednisone then downhill since > sev ER trips and has developed neb dependence -Tried symbicort and dulera through urgent.  Took 2 puffs of dulera am of ov but still needed neb 2hours later. rec Dulera 100 Take 2 puffs first thing in am and then another 2 puffs about 12 hours later.  Work on [employer], [employer]:   Prednisone 10 mg take  4 each am x 2 days,   2 each am x 2 days,  1 each am x2days and stop  If can't catch your breath ok to ventolin up to 2 puffs and only if this doesn't work ok to use the nebulizer but stop using the spacer Prilosec 20mg   Take 30-60 min before first meal of the day and Pepcid 20 mg one bedtime      Admit date: 11/26/2012  Discharge date: 11/29/2012  Discharge Diagnoses:   ASTHMA, WITH ACUTE EXACERBATION   Asthmatic bronchitis  Hypokalemia  URI, acute  Leukocytosis  Dehydration  Sinus tachycardia  12/06/2012 post hosp f/u ov/[name]  back to baseline x still using too much saba "the way she was in the hospital" with minimal residual nasal and ear congestion, no purulent sputum, almost all done with prednisone. rec Plan A Dulera 100 Take 2 puffs first thing in am and then another 2 puffs about 12 hours later.  Only use your albuterol (xopenex inhaler Plan B,  xopenex neb is plan C)  as a rescue medication  GERD diet  12/25/2012 f/u ov/[name] cc some better then  C/o gradual increased SOB and wheezing x 2 wks, worse for the past couple days. Still has prod cough with dark green  sputum since the last visit here. In retrospect never cleared nasal congestion and not using xopenex at all then started using hfa daytime and neb at night.  rec Augmentin 875 twice daily x 10 days and if head congestion not resolved completely need a sinus CT > [name] Plan A:  Dulera 200 Take 2 puffs first thing in am and then another 2 puffs about 12 hours later.  Only use your albuterol (xopenex inhaler Plan B,  xopenex neb is plan C)    01/29/2013 f/u ov/[name] cc never got CT, never got completely bettter with nasal congestion but much better vs last ov and very little need for saba.  Did not call for ct sinus as rec rec singulair 10 mg one each pm  if head congestion not resolved completely need a sinus CT > [name] Plan A:  Dulera 200 Take 2 puffs first thing in am and then another 2 puffs about 12 hours later.  Only use your albuterol (xopenex inhaler Plan B,  xopenex neb is plan C)  as a rescue medication   06/25/2013 f/u ov/[name] re asthma Chief Complaint  Patient presents with  . Follow-up    Pt states overall her breathing is doing well. She does notice  has increased symptoms when she is outside alot.   rare need for saba as long as has access to dulera ,  Does still have Wheeze sob, and cough on exp to outdoors, cats and dogs.   At baseline sleeping ok without nocturnal  or early am exacerbation  of respiratory  c/o's or need for noct saba. Also denies any obvious fluctuation of symptoms with weather or environmental changes or other aggravating or alleviating factors except as outlined above   ROS  The following are not active complaints unless bolded sore throat, dysphagia, dental problems, itching, sneezing,  nasal congestion, ear ache,   fever, chills, sweats, unintended wt loss, pleuritic or exertional cp, hemoptysis,  orthopnea pnd or leg swelling, presyncope, palpitations, heartburn, abdominal pain, anorexia, nausea, vomiting, diarrhea  or change in bowel or  urinary habits, change in stools or urine, dysuria,hematuria,  rash, arthralgias, visual complaints, headache, numbness weakness or ataxia or problems with walking or coordination,  change in mood/affect or memory.             Objective:   Physical Exam  08/17/2011  245 > 234 12/06/2012  > 234 12/25/2012 >  248 06/25/2013   Anxious amb wf nad  Took albuterol w/in 4 h of ov as had no dulera  HEENT: nl dentition, turbinates, and orophanx. Nl external ear canals without cough reflex   NECK :  without JVD/Nodes/TM/ nl carotid upstrokes bilaterally   LUNGS: clear bilaterally  - no wheeze, short T exp   CV:  RRR  no s3 or murmur or increase in P2, no edema   ABD:  soft and nontender with nl excursion in the supine position. No bruits or organomegaly, bowel sounds nl  MS:  warm without deformities, calf tenderness, cyanosis or clubbing      cxr 11/28/12 Thoracic dextroscoliosis again noted. Mild basilar atelectasis.  No infiltrate or pulmonary edema. Question trace right pleural  effusion with blunting of the right costophrenic angle.         Assessment & Plan:

## 2013-06-25 NOTE — Patient Instructions (Addendum)
Dulera 200 Take 2 puffs first thing in am and then another 2 puffs about 12 hours later.   Stop singulair  You will need to drug formulary to see if your company covers symbicort or advair better.   Please schedule a follow up visit in 6  months but call sooner if needed

## 2013-06-27 NOTE — Assessment & Plan Note (Signed)
-   HFA 90% 12/25/2012   - Allergy profile   12/25/2012 > Eos 10%,  Ig E  222 Pos dogs/ cats/grass  DDX of  difficult airways managment all start with A and  include Adherence, Ace Inhibitors, Acid Reflux, Active Sinus Disease, Alpha 1 Antitripsin deficiency, Anxiety masquerading as Airways dz,  ABPA,  allergy(esp in young), Aspiration (esp in elderly), Adverse effects of DPI,  Active smokers, plus two Bs  = Bronchiectasis and Beta blocker use..and one C= CHF  Adherence is always the initial "prime suspect" and is a multilayered concern that requires a "trust but verify" approach in every patient - starting with knowing how to use medications, especially inhalers, correctly, keeping up with refills and understanding the fundamental difference between maintenance and prns vs those medications only taken for a very short course and then stopped and not refilled.   Her main issue is access to meds > See instructions for specific recommendations which were reviewed directly with the patient who was given a copy with highlighter outlining the key components.

## 2013-09-16 ENCOUNTER — Other Ambulatory Visit: Payer: Self-pay | Admitting: Internal Medicine

## 2013-10-03 ENCOUNTER — Telehealth: Payer: Self-pay | Admitting: Internal Medicine

## 2013-10-03 ENCOUNTER — Encounter: Payer: Self-pay | Admitting: Internal Medicine

## 2013-10-03 ENCOUNTER — Ambulatory Visit (INDEPENDENT_AMBULATORY_CARE_PROVIDER_SITE_OTHER): Payer: BC Managed Care – PPO | Admitting: Internal Medicine

## 2013-10-03 VITALS — BP 124/72 | HR 105 | Temp 97.8°F | Ht 66.0 in | Wt 252.8 lb

## 2013-10-03 DIAGNOSIS — Z23 Encounter for immunization: Secondary | ICD-10-CM

## 2013-10-03 DIAGNOSIS — J45909 Unspecified asthma, uncomplicated: Secondary | ICD-10-CM

## 2013-10-03 MED ORDER — MONTELUKAST SODIUM 10 MG PO TABS: ORAL_TABLET | ORAL | Status: DC

## 2013-10-03 MED ORDER — MOMETASONE FURO-FORMOTEROL FUM 200-5 MCG/ACT IN AERO: INHALATION_SPRAY | RESPIRATORY_TRACT | Status: DC

## 2013-10-03 MED ORDER — PREDNISONE (PAK) 10 MG PO TABS: ORAL_TABLET | ORAL | Status: DC

## 2013-10-03 NOTE — Telephone Encounter (Signed)
I spoke with the pt and she is having increased SOB, cough, chest tightness, chest congestion, and wheezing. Pt states she has been using her rescue inhaler more. Pt is concerned about waiting for an appt tomorrow and states she feels she will end up in the ER tonight if she does not come in today so appt set for today at 1:30. Carron Curie, CMA

## 2013-10-03 NOTE — Patient Instructions (Addendum)
Resume dulera 200 Take 2 puffs first thing in am and then another 2 puffs about 12 hours later - you cannot under any circumstances be off this medication or a similar maintenance inhaler given the severity of your asthma so call us if any problem acquiring it through your company     Only use your albuterol (proaire) as a rescue medication to be used if you can't catch your breath by resting or doing a relaxed purse lip breathing pattern.  - The less you use it, the better it will work when you need it. - Ok to use up to every 4 hours if you must but call for immediate appointment if use goes up over your usual need - Don't leave home without it !!  (think of it like your spare tire for your car)  - Nebulizer backs up the proaire up to every 4 hours if needed   Please schedule a follow up visit in 3 months but call sooner if needed

## 2013-10-03 NOTE — Telephone Encounter (Signed)
Pt is having difficulty breathing.  Set an appt w/ MW for tomorrow AM @ 10:15.  Pt is afraid that she may have to go to ED.  Pt really doesn't want to be hospitalized & states this is how things got last year before she went to hospital.  Any recs for today or can pt be worked in?

## 2013-10-03 NOTE — Progress Notes (Signed)
Subjective:    Patient ID: [name], female    DOB: 1981-05-07   MRN: [id number]    Brief patient profile:  32 yowf dx with asthma age 32 with good control including nl pregnancy while on advair 250 in 2007    08/17/2011 Initial pulmonary office eval in EMR era cc asthma worse since 2009 p mold exposure thru Feb 2012 when left that  House> moved to [place] p admitted to [facility] Feb 2012 and discharged initially on advair for did ok for  several weeks while on prednisone then downhill since > sev ER trips and has developed neb dependence -Tried symbicort and dulera through urgent.  Took 2 puffs of dulera am of ov but still needed neb 2hours later. rec Dulera 100 Take 2 puffs first thing in am and then another 2 puffs about 12 hours later.  Work on [employer], [employer]:   Prednisone 10 mg take  4 each am x 2 days,   2 each am x 2 days,  1 each am x2days and stop  If can't catch your breath ok to ventolin up to 2 puffs and only if this doesn't work ok to use the nebulizer but stop using the spacer Prilosec 20mg   Take 30-60 min before first meal of the day and Pepcid 20 mg one bedtime      Admit date: 11/26/2012  Discharge date: 11/29/2012  Discharge Diagnoses:   ASTHMA, WITH ACUTE EXACERBATION   Asthmatic bronchitis  Hypokalemia  URI, acute  Leukocytosis  Dehydration  Sinus tachycardia  12/06/2012 post hosp f/u ov/Rorie Delmore  back to baseline x still using too much saba "the way she was in the hospital" with minimal residual nasal and ear congestion, no purulent sputum, almost all done with prednisone. rec Plan A Dulera 100 Take 2 puffs first thing in am and then another 2 puffs about 12 hours later.  Only use your albuterol (xopenex inhaler Plan B,  xopenex neb is plan C)  as a rescue medication  GERD diet  12/25/2012 f/u ov/Joeseph Verville cc some better then  C/o gradual increased SOB and wheezing x 2 wks, worse for the past couple days. Still has prod cough with dark green  sputum since the last visit here. In retrospect never cleared nasal congestion and not using xopenex at all then started using hfa daytime and neb at night.  rec Augmentin 875 twice daily x 10 days and if head congestion not resolved completely need a sinus CT > [name] Plan A:  Dulera 200 Take 2 puffs first thing in am and then another 2 puffs about 12 hours later.  Only use your albuterol (xopenex inhaler Plan B,  xopenex neb is plan C)    01/29/2013 f/u ov/Kathe Wirick cc never got CT, never got completely bettter with nasal congestion but much better vs last ov and very little need for saba.  Did not call for ct sinus as rec rec singulair 10 mg one each pm  if head congestion not resolved completely need a sinus CT > [name] Plan A:  Dulera 200 Take 2 puffs first thing in am and then another 2 puffs about 12 hours later.  Only use your albuterol (xopenex inhaler Plan B,  xopenex neb is plan C)  as a rescue medication   06/25/2013 f/u ov/Bennie Scaff re asthma Chief Complaint  Patient presents with  . Follow-up    Pt states overall her breathing is doing well. She does notice  has increased symptoms when she is outside alot.   rare need for saba as long as has access to dulera ,  Does still have Wheeze sob, and cough on exp to outdoors, cats and dogs. rec Dulera 200 Take 2 puffs first thing in am and then another 2 puffs about 12 hours later.  Stop singulair You will need to drug formulary to see if your company covers symbicort or advair better.      10/03/2013 f/u ov/Salik Grewell re: asthma flare off dulera x sev weeks Chief Complaint  Patient presents with  . Acute Visit    Pt reports having increased wheezing and SOB- onset 2 days ago.  She states that she woke up in the night last night feeling out of breath and had to use proair with no relief. She has also had prod cough with large amounts of green sputum x 2 days.    used both hfa and neb in last 24 h   At baseline sleeping ok  without nocturnal  or early am exacerbation  of respiratory  c/o's or need for noct saba. Also denies any obvious fluctuation of symptoms with weather or environmental changes or other aggravating or alleviating factors except as outlined above   ROS  The following are not active complaints unless bolded sore throat, dysphagia, dental problems, itching, sneezing,  nasal congestion worse off singulair, ear ache,   fever, chills, sweats, unintended wt loss, pleuritic or exertional cp, hemoptysis,  orthopnea pnd or leg swelling, presyncope, palpitations, heartburn, abdominal pain, anorexia, nausea, vomiting, diarrhea  or change in bowel or urinary habits, change in stools or urine, dysuria,hematuria,  rash, arthralgias, visual complaints, headache, numbness weakness or ataxia or problems with walking or coordination,  change in mood/affect or memory.             Objective:   Physical Exam  08/17/2011  245 > 234 12/06/2012  > 234 12/25/2012 >  248 06/25/2013 > 10/03/2013  252   Anxious amb wf nad  Took albuterol w/in 4 h of ov as had no dulera  HEENT: nl dentition, turbinates, and orophanx. Nl external ear canals without cough reflex   NECK :  without JVD/Nodes/TM/ nl carotid upstrokes bilaterally   LUNGS: trace exp wheeze bilaterally    CV:  RRR  no s3 or murmur or increase in P2, no edema   ABD:  soft and nontender with nl excursion in the supine position. No bruits or organomegaly, bowel sounds nl  MS:  warm without deformities, calf tenderness, cyanosis or clubbing      cxr 11/28/12 Thoracic dextroscoliosis again noted. Mild basilar atelectasis.  No infiltrate or pulmonary edema. Question trace right pleural  effusion with blunting of the right costophrenic angle.         Assessment & Plan:

## 2013-10-03 NOTE — Assessment & Plan Note (Signed)
-   HFA 90% 12/25/2012   - Allergy profile   12/25/2012 > Eos 10%,  Ig E  222 Pos dogs/ cats/grass  DDX of  difficult airways managment all start with A and  include Adherence, Ace Inhibitors, Acid Reflux, Active Sinus Disease, Alpha 1 Antitripsin deficiency, Anxiety masquerading as Airways dz,  ABPA,  allergy(esp in young), Aspiration (esp in elderly), Adverse effects of DPI,  Active smokers, plus two Bs  = Bronchiectasis and Beta blocker use..and one C= CHF  .Adherence is always the initial "prime suspect" and is a multilayered concern that requires a "trust but verify" approach in every patient - starting with knowing how to use medications, especially inhalers, correctly, keeping up with refills and understanding the fundamental difference between maintenance and prns vs those medications only taken for a very short course and then stopped and not refilled.  - keeps running out of dulera ? Why > one mon samples given -The proper method of use, as well as anticipated side effects, of a metered-dose inhaler are discussed and demonstrated to the patient. Improved effectiveness after extensive coaching during this visit to a level of approximately  90%  ? Allergy> add back singulair

## 2013-10-04 ENCOUNTER — Ambulatory Visit: Payer: BC Managed Care – PPO | Admitting: Internal Medicine

## 2013-10-20 LAB — HM PAP SMEAR: HM Pap smear: NORMAL

## 2013-10-31 ENCOUNTER — Other Ambulatory Visit: Payer: Self-pay | Admitting: Internal Medicine

## 2013-11-21 ENCOUNTER — Telehealth: Payer: Self-pay | Admitting: Internal Medicine

## 2013-11-21 NOTE — Telephone Encounter (Signed)
Called pt x2.  Pt has VM that is not yet set up so unable to LM

## 2013-11-22 MED ORDER — MOMETASONE FURO-FORMOTEROL FUM 200-5 MCG/ACT IN AERO: INHALATION_SPRAY | RESPIRATORY_TRACT | Status: DC

## 2013-11-22 NOTE — Telephone Encounter (Signed)
I spoke with the pt and advised that sample has been left at front. Carron Curie, CMA

## 2013-12-03 ENCOUNTER — Ambulatory Visit (INDEPENDENT_AMBULATORY_CARE_PROVIDER_SITE_OTHER): Payer: BC Managed Care – PPO | Admitting: Internal Medicine

## 2013-12-03 ENCOUNTER — Encounter: Payer: Self-pay | Admitting: Internal Medicine

## 2013-12-03 VITALS — BP 104/80 | HR 88 | Temp 97.7°F | Ht 65.0 in | Wt 263.0 lb

## 2013-12-03 DIAGNOSIS — J45909 Unspecified asthma, uncomplicated: Secondary | ICD-10-CM

## 2013-12-03 MED ORDER — BUDESONIDE-FORMOTEROL FUMARATE 160-4.5 MCG/ACT IN AERO: INHALATION_SPRAY | RESPIRATORY_TRACT | Status: DC

## 2013-12-03 NOTE — Patient Instructions (Addendum)
Stop dulera  Start symbicort 160 Take 2 puffs first thing in am and then another 2 puffs about 12 hours later.    Only use your albuterol as a rescue medication to be used if you can't catch your breath by resting or doing a relaxed purse lip breathing pattern.  - The less you use it, the better it will work when you need it. - Ok to use up to 2 puffs every 4 hours if you must but call for immediate appointment if use goes up over your usual need - Don't leave home without it !!  (think of it like your spare tire for your car)   Please schedule a follow up visit in 3 months but call sooner if needed

## 2013-12-03 NOTE — Progress Notes (Signed)
Subjective:    Patient ID: [name], female    DOB: 1981/06/13   MRN: [id number]    Brief patient profile:  32 yowf dx with asthma age 32 with good control including nl pregnancy while on advair 250 in 2007   History of Present Illness  08/17/2011 Initial pulmonary office eval in EMR era cc asthma worse since 2009 p mold exposure thru Feb 2012 when left that  House> moved to [place] p admitted to [facility] Feb 2012 and discharged initially on advair for did ok for  several weeks while on prednisone then downhill since > sev ER trips and has developed neb dependence -Tried symbicort and dulera through urgent.  Took 2 puffs of dulera am of ov but still needed neb 2hours later. rec Dulera 100 Take 2 puffs first thing in am and then another 2 puffs about 12 hours later.  Work on [employer], commercial:   Prednisone 10 mg take  4 each am x 2 days,   2 each am x 2 days,  1 each am x2days and stop  If can't catch your breath ok to ventolin up to 2 puffs and only if this doesn't work ok to use the nebulizer but stop using the spacer Prilosec 20mg   Take 30-60 min before first meal of the day and Pepcid 20 mg one bedtime      Admit date: 11/26/2012  Discharge date: 11/29/2012  Discharge Diagnoses:   ASTHMA, WITH ACUTE EXACERBATION   Asthmatic bronchitis  Hypokalemia  URI, acute  Leukocytosis  Dehydration  Sinus tachycardia  12/06/2012 post hosp f/u ov/Joeanthony Seeling  back to baseline x still using too much saba "the way she was in the hospital" with minimal residual nasal and ear congestion, no purulent sputum, almost all done with prednisone. rec Plan A Dulera 100 Take 2 puffs first thing in am and then another 2 puffs about 12 hours later.  Only use your albuterol (xopenex inhaler Plan B,  xopenex neb is plan C)  as a rescue medication  GERD diet  12/25/2012 f/u ov/Cayla Wiegand cc some better then  C/o gradual increased SOB and wheezing x 2 wks, worse for the past couple days. Still has  prod cough with dark green sputum since the last visit here. In retrospect never cleared nasal congestion and not using xopenex at all then started using hfa daytime and neb at night.  rec Augmentin 875 twice daily x 10 days and if head congestion not resolved completely need a sinus CT > [name] Plan A:  Dulera 200 Take 2 puffs first thing in am and then another 2 puffs about 12 hours later.  Only use your albuterol (xopenex inhaler Plan B,  xopenex neb is plan C)    01/29/2013 f/u ov/Aisa Schoeppner cc never got CT, never got completely bettter with nasal congestion but much better vs last ov and very little need for saba.  Did not call for ct sinus as rec rec singulair 10 mg one each pm  if head congestion not resolved completely need a sinus CT > [name] Plan A:  Dulera 200 Take 2 puffs first thing in am and then another 2 puffs about 12 hours later.  Only use your albuterol (xopenex inhaler Plan B,  xopenex neb is plan C)  as a rescue medication   06/25/2013 f/u ov/Sylwia Cuervo re asthma Chief Complaint  Patient presents with  . Follow-up    Pt states overall her breathing is doing  well. She does notice has increased symptoms when she is outside alot.   rare need for saba as long as has access to dulera ,  Does still have Wheeze sob, and cough on exp to outdoors, cats and dogs. rec Dulera 200 Take 2 puffs first thing in am and then another 2 puffs about 12 hours later.  Stop singulair You will need to drug formulary to see if your company covers symbicort or advair better.      10/03/2013 f/u ov/Kais Monje re: asthma flare off dulera x sev weeks Chief Complaint  Patient presents with  . Acute Visit    Pt reports having increased wheezing and SOB- onset 2 days ago.  She states that she woke up in the night last night feeling out of breath and had to use proair with no relief. She has also had prod cough with large amounts of green sputum x 2 days.    used both hfa and neb in last 24  h rec Resume dulera 200 Take 2 puffs first thing in am and then another 2 puffs about 12 hours later - you cannot under any circumstances be off this medication or a similar maintenance inhaler given the severity of your asthma so call us if any problem acquiring it through your company  Only use your albuterol (proaire) as a rescue medication   12/03/2013 f/u ov/Caleesi Kohl re: asthma Chief Complaint  Patient presents with  . Follow-up    Pt states that her breathing is overall doing well today. No new co's today.   when on dulera does fine s need for saba hfa/ neb but finding dulera too expensive under her commercial insurance plan    No obvious day to day or daytime variabilty or assoc chronic cough or cp or chest tightness, subjective wheeze overt sinus or hb symptoms. No unusual exp hx or h/o childhood pna/ asthma or knowledge of premature birth.  Sleeping ok without nocturnal  or early am exacerbation  of respiratory  c/o's or need for noct saba. Also denies any obvious fluctuation of symptoms with weather or environmental changes or other aggravating or alleviating factors except as outlined above   Current Medications, Allergies, Complete Past Medical History, Past Surgical History, Family History, and Social History were reviewed in [employer] record.  ROS  The following are not active complaints unless bolded sore throat, dysphagia, dental problems, itching, sneezing,  nasal congestion or excess/ purulent secretions, ear ache,   fever, chills, sweats, unintended wt loss, pleuritic or exertional cp, hemoptysis,  orthopnea pnd or leg swelling, presyncope, palpitations, heartburn, abdominal pain, anorexia, nausea, vomiting, diarrhea  or change in bowel or urinary habits, change in stools or urine, dysuria,hematuria,  rash, arthralgias, visual complaints, headache, numbness weakness or ataxia or problems with walking or coordination,  change in mood/affect or memory.            Objective:   Physical Exam  08/17/2011  245 > 234 12/06/2012  > 234 12/25/2012 >  248 06/25/2013 > 10/03/2013  252 > 12/03/2013 263  Anxious amb wf nad  Took albuterol w/in 4 h of ov as had no dulera  HEENT: nl dentition, turbinates, and orophanx. Nl external ear canals without cough reflex   NECK :  without JVD/Nodes/TM/ nl carotid upstrokes bilaterally   LUNGS: clear to A and P    CV:  RRR  no s3 or murmur or increase in P2, no edema   ABD:  soft and nontender  with nl excursion in the supine position. No bruits or organomegaly, bowel sounds nl  MS:  warm without deformities, calf tenderness, cyanosis or clubbing      cxr 11/28/12 Thoracic dextroscoliosis again noted. Mild basilar atelectasis.  No infiltrate or pulmonary edema. Question trace right pleural  effusion with blunting of the right costophrenic angle.         Assessment & Plan:

## 2013-12-04 NOTE — Assessment & Plan Note (Signed)
-   HFA 90% 12/25/2012   - Allergy profile   12/25/2012 > Eos 10%,  Ig E  222 Pos dogs/ cats/grass  DDX of  difficult airways managment all start with A and  include Adherence, Ace Inhibitors, Acid Reflux, Active Sinus Disease, Alpha 1 Antitripsin deficiency, Anxiety masquerading as Airways dz,  ABPA,  allergy(esp in young), Aspiration (esp in elderly), Adverse effects of DPI,  Active smokers, plus two Bs  = Bronchiectasis and Beta blocker use..and one C= CHF  Adherence is always the initial "prime suspect" and is a multilayered concern that requires a "trust but verify" approach in every patient - starting with knowing how to use medications, especially inhalers, correctly, keeping up with refills and understanding the fundamental difference between maintenance and prns vs those medications only taken for a very short course and then stopped and not refilled.  - The proper method of use, as well as anticipated side effects, of a metered-dose inhaler are discussed and demonstrated to the patient. Improved effectiveness after extensive coaching during this visit to a level of approximately  75% - insurance issues addessed : try the 25$ guarantee from symbicort 160 2bid     Each maintenance medication was reviewed in detail including most importantly the difference between maintenance and as needed and under what circumstances the prns are to be used.  Please see instructions for details which were reviewed in writing and the patient given a copy.

## 2013-12-31 ENCOUNTER — Other Ambulatory Visit: Payer: Self-pay | Admitting: Internal Medicine

## 2014-01-23 ENCOUNTER — Other Ambulatory Visit: Payer: Self-pay | Admitting: Internal Medicine

## 2014-04-08 ENCOUNTER — Ambulatory Visit: Payer: BC Managed Care – PPO | Admitting: Internal Medicine

## 2014-04-15 ENCOUNTER — Ambulatory Visit: Payer: BC Managed Care – PPO | Admitting: Internal Medicine

## 2014-08-12 ENCOUNTER — Ambulatory Visit: Payer: BC Managed Care – PPO | Admitting: Family Medicine

## 2014-08-13 ENCOUNTER — Encounter: Payer: Self-pay | Admitting: General Practice

## 2014-08-13 ENCOUNTER — Ambulatory Visit (INDEPENDENT_AMBULATORY_CARE_PROVIDER_SITE_OTHER): Payer: BC Managed Care – PPO | Admitting: Family Medicine

## 2014-08-13 ENCOUNTER — Encounter: Payer: Self-pay | Admitting: Family Medicine

## 2014-08-13 VITALS — BP 104/76 | HR 94 | Temp 97.9°F | Resp 16 | Ht 66.0 in | Wt 272.4 lb

## 2014-08-13 DIAGNOSIS — F418 Other specified anxiety disorders: Secondary | ICD-10-CM | POA: Insufficient documentation

## 2014-08-13 DIAGNOSIS — F341 Dysthymic disorder: Secondary | ICD-10-CM

## 2014-08-13 LAB — BASIC METABOLIC PANEL
BUN: 12 mg/dL (ref 6–23)
CHLORIDE: 102 meq/L (ref 96–112)
CO2: 26 mEq/L (ref 19–32)
CREATININE: 0.7 mg/dL (ref 0.4–1.2)
Calcium: 9.2 mg/dL (ref 8.4–10.5)
GFR: 103.33 mL/min (ref >60.00)
GLUCOSE: 99 mg/dL (ref 70–99)
POTASSIUM: 4 meq/L (ref 3.5–5.1)
Sodium: 137 mEq/L (ref 135–145)

## 2014-08-13 LAB — HEPATIC FUNCTION PANEL
ALBUMIN: 4.3 g/dL (ref 3.5–5.2)
ALK PHOS: 64 U/L (ref 39–117)
ALT: 18 U/L (ref 0–35)
AST: 18 U/L (ref 0–37)
Bilirubin, Direct: 0 mg/dL (ref 0.0–0.3)
TOTAL PROTEIN: 7.6 g/dL (ref 6.0–8.3)
Total Bilirubin: 0.3 mg/dL (ref 0.2–1.2)

## 2014-08-13 LAB — CBC WITH DIFFERENTIAL/PLATELET
BASOS ABS: 0 10*3/uL (ref 0.0–0.1)
Basophils Relative: 0.5 % (ref 0.0–3.0)
EOS PCT: 4.4 % (ref 0.0–5.0)
Eosinophils Absolute: 0.4 10*3/uL (ref 0.0–0.7)
HEMATOCRIT: 40.8 % (ref 36.0–46.0)
Hemoglobin: 13.8 g/dL (ref 12.0–15.0)
LYMPHS ABS: 2.1 10*3/uL (ref 0.7–4.0)
LYMPHS PCT: 22.9 % (ref 12.0–46.0)
MCHC: 33.7 g/dL (ref 30.0–36.0)
MCV: 90.7 fl (ref 78.0–100.0)
MONOS PCT: 7 % (ref 3.0–12.0)
Monocytes Absolute: 0.6 10*3/uL (ref 0.1–1.0)
NEUTROS PCT: 65.2 % (ref 43.0–77.0)
Neutro Abs: 6 10*3/uL (ref 1.4–7.7)
PLATELETS: 306 10*3/uL (ref 150.0–400.0)
RBC: 4.5 Mil/uL (ref 3.87–5.11)
RDW: 12.6 % (ref 11.5–15.5)
WBC: 9.2 10*3/uL (ref 4.0–10.5)

## 2014-08-13 LAB — LIPID PANEL
CHOL/HDL RATIO: 4
CHOLESTEROL: 193 mg/dL (ref 0–200)
HDL: 46.2 mg/dL (ref >39.00)
LDL CALC: 110 mg/dL — AB (ref 0–99)
NonHDL: 146.8
TRIGLYCERIDES: 184 mg/dL — AB (ref 0.0–149.0)
VLDL: 36.8 mg/dL (ref 0.0–40.0)

## 2014-08-13 LAB — VITAMIN D 25 HYDROXY (VIT D DEFICIENCY, FRACTURES): VITD: 29.4 ng/mL — AB (ref 30.00–100.00)

## 2014-08-13 LAB — TSH: TSH: 2.06 u[IU]/mL (ref 0.35–4.50)

## 2014-08-13 MED ORDER — FLUOXETINE HCL 20 MG PO TABS: 20.0000 mg | ORAL_TABLET | Freq: Every day | ORAL | Status: DC

## 2014-08-13 NOTE — Assessment & Plan Note (Signed)
New to provider, ongoing struggle for pt.  Check labs to risk stratify.  Encouraged regular, aerobic activity for 30 minutes at least 4x/week and monitoring caloric intake w/ the help of MyFitnessPal app.

## 2014-08-13 NOTE — Progress Notes (Signed)
   Subjective:    Patient ID: [name], female    DOB: 1981/10/19, 33 y.o.   MRN: [id number]  HPI New to establish.  Previous MD- [facility]  Pulmonary- [facility]  Depression/anxiety- pt reports very low energy.  Stays home w/ 31 yr old (31 yr old in school).  Pt feels getting up and preparing meals or household chores is taxing.  Sweating frequently w/ minimal exertion.  Not sleeping well.  'i'm never settled- i feel like i need to move again'.  Increased mood swings, easily mad/offended.  Son told her that she's 'mean'.  'happily married', 'love my kids'.  Easily tearful.  + weight gain.  Pt reports a lot of family stressors- mother who undermines her and her parenting (in front of her children), sister who is very competitive.   Review of Systems For ROS see HPI     Objective:   Physical Exam  Vitals reviewed. Constitutional: She is oriented to person, place, and time. She appears well-developed and well-nourished. No distress.  obese  HENT:  Head: Normocephalic and atraumatic.  Eyes: Conjunctivae and EOM are normal. Pupils are equal, round, and reactive to light.  Neck: Normal range of motion. Neck supple. No thyromegaly present.  Cardiovascular: Normal rate, regular rhythm, normal heart sounds and intact distal pulses.   No murmur heard. Pulmonary/Chest: Effort normal and breath sounds normal. No respiratory distress.  Abdominal: Soft. She exhibits no distension. There is no tenderness.  Musculoskeletal: She exhibits no edema.  Lymphadenopathy:    She has no cervical adenopathy.  Neurological: She is alert and oriented to person, place, and time.  Skin: Skin is warm and dry.  Psychiatric: She has a normal mood and affect. Her behavior is normal.          Assessment & Plan:

## 2014-08-13 NOTE — Assessment & Plan Note (Signed)
New.  Suspect pt's fatigue, decreased motivation, poor sleep, tearfulness, weight gain, and feelings of guilt are all due to undiagnosed and untreated depression.  Given pt's long standing family issues, recommended she start counseling if possible.  Will also start low dose SSRI and monitor closely for improvement.  Pt expressed understanding and is in agreement w/ plan.

## 2014-08-13 NOTE — Patient Instructions (Signed)
Follow up as scheduled for your complete physical Start the Prozac daily Continue the healthy food choices and get regular exercise Consider starting counseling to discuss your feelings Call with any questions or concerns Hang in there!!  You can do this!

## 2014-08-13 NOTE — Progress Notes (Signed)
Pre visit review using our clinic review tool, if applicable. No additional management support is needed unless otherwise documented below in the visit note. 

## 2014-08-14 ENCOUNTER — Encounter: Payer: Self-pay | Admitting: General Practice

## 2014-08-20 ENCOUNTER — Ambulatory Visit: Payer: BC Managed Care – PPO | Admitting: Family Medicine

## 2014-08-20 ENCOUNTER — Telehealth: Payer: Self-pay

## 2014-08-20 NOTE — Telephone Encounter (Signed)
Pt can come back in 4-6 weeks for a mood follow up.

## 2014-08-20 NOTE — Telephone Encounter (Signed)
Blaike Newburn [phone]  Natacia called to cancel her new pt appointment today because her car broke down and she has no way. She was in last week with a acute issue and she was to follow up in a week. I schedule her for a new patient appointment but it was not until 12/31/14. Is there somewhere you would like me to work her in, she prefers a Monday if at all possible.

## 2014-08-20 NOTE — Telephone Encounter (Signed)
Left message for patient to call.

## 2014-09-18 ENCOUNTER — Ambulatory Visit: Payer: BC Managed Care – PPO | Admitting: Family Medicine

## 2014-09-18 DIAGNOSIS — Z0289 Encounter for other administrative examinations: Secondary | ICD-10-CM

## 2014-09-25 ENCOUNTER — Telehealth: Payer: Self-pay | Admitting: Internal Medicine

## 2014-09-25 NOTE — Telephone Encounter (Signed)
Fine with me x 3 m only but then needs to be sure to make appt before more as we haven't seen her since 12/02/13

## 2014-09-25 NOTE — Telephone Encounter (Signed)
Called LMTCB x1 for pt

## 2014-09-25 NOTE — Telephone Encounter (Signed)
Per 12/03/2013 OV w/ MW. Please schedule a follow up visit in 3 months but call sooner if needed    Called spoke with pt. Pt reports her insurance will end 11/04/14. She reports her new insurance will not start for  Another 90 days since her spouse is starting a new job w/ new insurance. She wants to know if we can send in 90 day supply for symbicort until she gets new insurance and can come in for OV. Please advise MW thanks

## 2014-09-26 ENCOUNTER — Telehealth: Payer: Self-pay | Admitting: Internal Medicine

## 2014-09-26 MED ORDER — BUDESONIDE-FORMOTEROL FUMARATE 160-4.5 MCG/ACT IN AERO: INHALATION_SPRAY | RESPIRATORY_TRACT | Status: DC

## 2014-09-26 NOTE — Telephone Encounter (Signed)
Pt states that she can not get a 90 day supply of Symbicort at her local pharmacy and can to go through mail in pharmacy at this time-will send new Rx to pharmacy for 1 month supply and then she will discuss other options with MW at her appt on Monday 09-30-14. Nothing more needed at this time.

## 2014-09-26 NOTE — Telephone Encounter (Signed)
Pt returned call & can be reached at [phone].  [name]

## 2014-09-26 NOTE — Telephone Encounter (Signed)
Pt aware that MW Dorette Grateokayed for her to have 3 month supply of Symbicort but she must she him for follow up before he can give more refills. Rx sent and patient is scheduling OV with front staff. Nothing more needed at this time.

## 2014-09-26 NOTE — Telephone Encounter (Signed)
LMTCB

## 2014-09-30 ENCOUNTER — Ambulatory Visit (INDEPENDENT_AMBULATORY_CARE_PROVIDER_SITE_OTHER): Payer: BC Managed Care – PPO | Admitting: Internal Medicine

## 2014-09-30 ENCOUNTER — Encounter (INDEPENDENT_AMBULATORY_CARE_PROVIDER_SITE_OTHER): Payer: Self-pay

## 2014-09-30 ENCOUNTER — Encounter: Payer: Self-pay | Admitting: Internal Medicine

## 2014-09-30 VITALS — BP 102/60 | HR 68 | Temp 98.8°F | Ht 66.0 in | Wt 277.8 lb

## 2014-09-30 DIAGNOSIS — J4520 Mild intermittent asthma, uncomplicated: Secondary | ICD-10-CM

## 2014-09-30 DIAGNOSIS — Z23 Encounter for immunization: Secondary | ICD-10-CM

## 2014-09-30 MED ORDER — FAMOTIDINE 20 MG PO TABS: ORAL_TABLET | ORAL | Status: DC

## 2014-09-30 MED ORDER — OMEPRAZOLE 40 MG PO CPDR: DELAYED_RELEASE_CAPSULE | ORAL | Status: DC

## 2014-09-30 MED ORDER — MONTELUKAST SODIUM 10 MG PO TABS: ORAL_TABLET | ORAL | Status: DC

## 2014-09-30 NOTE — Progress Notes (Signed)
Subjective:    Patient ID: [name], female    DOB: 1981-01-24   MRN: [id number]    Brief patient profile:  89 yowf dx with asthma age 33 with good control including nl pregnancy while on advair 250 in 2007   History of Present Illness  08/17/2011 Initial pulmonary office eval in EMR era cc asthma worse since 2009 p mold exposure thru Feb 2012 when left that  [name] p [facility] and discharged initially on advair for did ok for  several weeks while on prednisone then downhill since > sev ER trips and has developed neb dependence -Tried symbicort and dulera through urgent.  Took 2 puffs of dulera am of ov but still needed neb 2hours later. rec Dulera 100 Take 2 puffs first thing in am and then another 2 puffs about 12 hours later.  Work on Horticulturist, commercialperfecting  inhaler technique:   Prednisone 10 mg take  4 each am x 2 days,   2 each am x 2 days,  1 each am x2days and stop  If can't catch your breath ok to ventolin up to 2 puffs and only if this doesn't work ok to use the nebulizer but stop using the spacer Prilosec 20mg   Take 30-60 min before first meal of the day and Pepcid 20 mg one bedtime      Admit date: 11/26/2012  Discharge date: 11/29/2012  Discharge Diagnoses:   ASTHMA, WITH ACUTE EXACERBATION   Asthmatic bronchitis  Hypokalemia  URI, acute  Leukocytosis  Dehydration  Sinus tachycardia  12/06/2012 post hosp f/u ov/Levita Monical  back to baseline x still using too much saba "the way she was in the hospital" with minimal residual nasal and ear congestion, no purulent sputum, almost all done with prednisone. rec Plan A Dulera 100 Take 2 puffs first thing in am and then another 2 puffs about 12 hours later.  Only use your albuterol (xopenex inhaler Plan B,  xopenex neb is plan C)  as a rescue medication  GERD diet  12/25/2012 f/u ov/Akshath Mccarey cc some better then  C/o gradual increased SOB and wheezing x 2 wks, worse for the past couple days. Still has  prod cough with dark green sputum since the last visit here. In retrospect never cleared nasal congestion and not using xopenex at all then started using hfa daytime and neb at night.  rec Augmentin 875 twice daily x 10 days and if head congestion not resolved completely need a sinus CT > [name] Plan A:  Dulera 200 Take 2 puffs first thing in am and then another 2 puffs about 12 hours later.  Only use your albuterol (xopenex inhaler Plan B,  xopenex neb is plan C)    01/29/2013 f/u ov/[name] cc never got CT, never got completely bettter with nasal congestion but much better vs last ov and very little need for saba.  Did not call for ct sinus as rec rec singulair 10 mg one each pm  if head congestion not resolved completely need a sinus CT > [name] Plan A:  Dulera 200 Take 2 puffs first thing in am and then another 2 puffs about 12 hours later.  Only use your albuterol (xopenex inhaler Plan B,  xopenex neb is plan C)  as a rescue medication   06/25/2013 f/u ov/[name] re asthma Chief Complaint  Patient presents with  . Follow-up    Pt states overall her breathing is doing  well. She does notice has increased symptoms when she is outside alot.   rare need for saba as long as has access to dulera ,  Does still have Wheeze sob, and cough on exp to outdoors, cats and dogs. rec Dulera 200 Take 2 puffs first thing in am and then another 2 puffs about 12 hours later.  Stop singulair You will need to drug formulary to see if your company covers symbicort or advair better.      10/03/2013 f/u ov/[name] re: asthma flare off dulera x sev weeks Chief Complaint  Patient presents with  . Acute Visit    Pt reports having increased wheezing and SOB- onset 2 days ago.  She states that she woke up in the night last night feeling out of breath and had to use proair with no relief. She has also had prod cough with large amounts of green sputum x 2 days.    used both hfa and neb in last 24  h rec Resume dulera 200 Take 2 puffs first thing in am and then another 2 puffs about 12 hours later - you cannot under any circumstances be off this medication or a similar maintenance inhaler given the severity of your asthma so call us if any problem acquiring it through your company  Only use your albuterol (proaire) as a rescue medication   12/03/2013 f/u ov/[name] re: asthma Chief Complaint  Patient presents with  . Follow-up    Pt states that her breathing is overall doing well today. No new co's today.   when on dulera does fine s need for saba hfa/ neb but finding dulera too expensive under her commercial insurance plan  rec Stop dulera Start symbicort 160 Take 2 puffs first thing in am and then another 2 puffs about 12 hours later.  Only use your albuterol as a rescue medication    09/30/2014 f/u ov/[name] re: asthma/ doing great on singulair/symbicort 160 2bid but does not carry rescue any more Chief Complaint  Patient presents with  . Follow-up    Pt states that her breathing is doing well and denies any new co's today.      Not limited by breathing from desired activities     No obvious day to day or daytime variabilty or assoc chronic cough or cp or chest tightness, subjective wheeze overt sinus or hb symptoms. No unusual exp hx or h/o childhood pna/ asthma or knowledge of premature birth.  Sleeping ok without nocturnal  or early am exacerbation  of respiratory  c/o's or need for noct saba. Also denies any obvious fluctuation of symptoms with weather or environmental changes or other aggravating or alleviating factors except as outlined above   Current Medications, Allergies, Complete Past Medical History, Past Surgical History, Family History, and Social History were reviewed in [facility] electronic medical record.  ROS  The following are not active complaints unless bolded sore throat, dysphagia, dental problems, itching, sneezing,  nasal congestion or excess/  purulent secretions, ear ache,   fever, chills, sweats, unintended wt loss, pleuritic or exertional cp, hemoptysis,  orthopnea pnd or leg swelling, presyncope, palpitations, heartburn, abdominal pain, anorexia, nausea, vomiting, diarrhea  or change in bowel or urinary habits, change in stools or urine, dysuria,hematuria,  rash, arthralgias, visual complaints, headache, numbness weakness or ataxia or problems with walking or coordination,  change in mood/affect or memory.           Objective:   Physical Exam  08/17/2011  245 >  234 12/06/2012  > 234 12/25/2012 >  248 06/25/2013 > 10/03/2013  252 > 12/03/2013 263> 09/30/2014 288   Anxious amb wf nad  Took albuterol w/in 4 h of ov as had no dulera  HEENT: nl dentition, turbinates, and orophanx. Nl external ear canals without cough reflex   NECK :  without JVD/Nodes/TM/ nl carotid upstrokes bilaterally   LUNGS: clear to A and P    CV:  RRR  no s3 or murmur or increase in P2, no edema   ABD:  soft and nontender with nl excursion in the supine position. No bruits or organomegaly, bowel sounds nl  MS:  warm without deformities, calf tenderness, cyanosis or clubbing      cxr 11/28/12 Thoracic dextroscoliosis again noted. Mild basilar atelectasis.  No infiltrate or pulmonary edema. Question trace right pleural  effusion with blunting of the right costophrenic angle.         Assessment & Plan:

## 2014-09-30 NOTE — Patient Instructions (Addendum)
No change for now but may able to reduce the dose to 80/4.5 2 every 12 hours when you return if you continue to do so well  Don't leave home without your rescue inhaler  Work on perfecting  inhaler technique:  relax and gently blow all the way out then take a nice smooth deep breath back in, triggering the inhaler at same time you start breathing in.  Hold for up to 5 seconds if you can.  Rinse and gargle with water when done  Please schedule a follow up visit in 3 months but call sooner if needed

## 2014-10-01 NOTE — Assessment & Plan Note (Addendum)
-    Allergy profile   12/25/2012 > Eos 10%,  Ig E  222 Pos dogs/ cats/grass - 09/30/2014 p extensive coaching HFA effectiveness =    90%  All goals of chronic asthma control met including optimal function and elimination of symptoms with minimal need for rescue therapy.  Contingencies discussed in full including contacting this office immediately if not controlling the symptoms using the rule of two's.     Reminded should always carry rescue saba with her  May do step down to the 80/4.5 dose if continues to do so well

## 2014-12-31 ENCOUNTER — Ambulatory Visit: Payer: BC Managed Care – PPO | Admitting: Family Medicine

## 2015-03-05 ENCOUNTER — Telehealth: Payer: Self-pay | Admitting: Internal Medicine

## 2015-03-05 MED ORDER — BUDESONIDE-FORMOTEROL FUMARATE 160-4.5 MCG/ACT IN AERO: INHALATION_SPRAY | RESPIRATORY_TRACT | Status: DC

## 2015-03-05 NOTE — Telephone Encounter (Signed)
Rx has been sent in. Pt is aware. Nothing further was needed. 

## 2015-03-06 ENCOUNTER — Telehealth: Payer: Self-pay | Admitting: Internal Medicine

## 2015-03-06 DIAGNOSIS — J4530 Mild persistent asthma, uncomplicated: Secondary | ICD-10-CM

## 2015-03-06 MED ORDER — PREDNISONE 10 MG PO TABS: ORAL_TABLET | ORAL | Status: DC

## 2015-03-06 NOTE — Telephone Encounter (Signed)
Pt scheduled for OV with MW 03/27/15 as directed per last OV (09/2014) - pt states that her Singulair is no longer working, would like to discuss further treatment.  Pt is wanting to know if he would benefit from getting allergy vaccines. Pt wanting to know if there is anything that can be done in the meantime for her increased allergy symptoms since the Singulair is no longer working.  Please advise Dr Sherene SiresWert. Thanks.

## 2015-03-06 NOTE — Telephone Encounter (Signed)
Spoke with the pt and notified of recs per MW  She verbalized understanding  Rx was sent to pharm  Pt to keep f/u appt

## 2015-03-06 NOTE — Telephone Encounter (Signed)
Allergy shots can take up to sev years to help so we don't need to make that decision today but will address at f/u   The best immediate way to help is to take Prednisone 10 mg take  4 each am x 2 days,   2 each am x 2 days,  1 each am x 2 days and stop   Would stop singulair if not helping

## 2015-03-27 ENCOUNTER — Ambulatory Visit: Payer: Self-pay | Admitting: Internal Medicine

## 2015-03-31 ENCOUNTER — Encounter: Payer: Self-pay | Admitting: Internal Medicine

## 2015-03-31 ENCOUNTER — Ambulatory Visit (INDEPENDENT_AMBULATORY_CARE_PROVIDER_SITE_OTHER): Payer: Managed Care, Other (non HMO) | Admitting: Internal Medicine

## 2015-03-31 ENCOUNTER — Telehealth: Payer: Self-pay | Admitting: Internal Medicine

## 2015-03-31 VITALS — BP 110/70 | HR 92 | Ht 66.0 in | Wt 280.0 lb

## 2015-03-31 DIAGNOSIS — J4530 Mild persistent asthma, uncomplicated: Secondary | ICD-10-CM

## 2015-03-31 MED ORDER — BUDESONIDE-FORMOTEROL FUMARATE 160-4.5 MCG/ACT IN AERO: INHALATION_SPRAY | RESPIRATORY_TRACT | Status: DC

## 2015-03-31 MED ORDER — AZELASTINE-FLUTICASONE 137-50 MCG/ACT NA SUSP: 1.0000 | Freq: Two times a day (BID) | NASAL | Status: DC

## 2015-03-31 MED ORDER — ALBUTEROL SULFATE HFA 108 (90 BASE) MCG/ACT IN AERS: 2.0000 | INHALATION_SPRAY | Freq: Four times a day (QID) | RESPIRATORY_TRACT | Status: DC | PRN

## 2015-03-31 NOTE — Patient Instructions (Addendum)
Try dymista one twice daily each nostil until seen again here - don't fill rx if not helping and need for zyrtec declines   Try zyrtec 10 mg daily as needed for itchy/ sneezy/ runny nose   We will refill you albuterol to use as needed  and your maintenance inhaler = symbicort 160   Avoid known triggers as much as you can  Schedule allergy eval next available with Dr Maple HudsonYoung

## 2015-03-31 NOTE — Telephone Encounter (Signed)
Per MW she needed a coupon  I spoke with her and left her one up front along with a sample  Nothing further needed

## 2015-03-31 NOTE — Progress Notes (Signed)
Subjective:    Patient ID: [name], female    DOB: 2029-01-24   MRN: [id number]    Brief patient profile:  [age] yowf dx with asthma age 34 with good control including nl pregnancy while on advair 250 in 2007   History of Present Illness  08/17/2011 Initial pulmonary office eval in EMR era cc asthma worse since 2009 p mold exposure thru Feb 2012 when left that  House> [name] p [facility] and discharged initially on advair for did ok for  several weeks while on prednisone then downhill since > sev ER trips and has developed neb dependence -Tried symbicort and dulera through urgent.  Took 2 puffs of dulera am of ov but still needed neb 2hours later. rec Dulera 100 Take 2 puffs first thing in am and then another 2 puffs about 12 hours later.  Work on Horticulturist, commercialperfecting  inhaler technique:   Prednisone 10 mg take  4 each am x 2 days,   2 each am x 2 days,  1 each am x2days and stop  If can't catch your breath ok to ventolin up to 2 puffs and only if this doesn't work ok to use the nebulizer but stop using the spacer Prilosec 20mg   Take 30-60 min before first meal of the day and Pepcid 20 mg one bedtime      Admit date: 11/26/2012  Discharge date: 11/29/2012  Discharge Diagnoses:   ASTHMA, WITH ACUTE EXACERBATION   Asthmatic bronchitis  Hypokalemia  URI, acute  Leukocytosis  Dehydration  Sinus tachycardia  12/06/2012 post hosp f/u ov/Daniqua Campoy  back to baseline x still using too much saba "the way she was in the hospital" with minimal residual nasal and ear congestion, no purulent sputum, almost all done with prednisone. rec Plan A Dulera 100 Take 2 puffs first thing in am and then another 2 puffs about 12 hours later.  Only use your albuterol (xopenex inhaler Plan B,  xopenex neb is plan C)  as a rescue medication  GERD diet  12/25/2012 f/u ov/Emilia Kayes cc some better then  C/o gradual increased SOB and wheezing x 2 wks, worse for the past couple days. Still has  prod cough with dark green sputum since the last visit here. In retrospect never cleared nasal congestion and not using xopenex at all then started using hfa daytime and neb at night.  rec Augmentin 875 twice daily x 10 days and if head congestion not resolved completely need a sinus CT > [name] Plan A:  Dulera 200 Take 2 puffs first thing in am and then another 2 puffs about 12 hours later.  Only use your albuterol (xopenex inhaler Plan B,  xopenex neb is plan C)    01/29/2013 f/u ov/Ayyub Krall cc never got CT, never got completely bettter with nasal congestion but much better vs last ov and very little need for saba.  Did not call for ct sinus as rec rec singulair 10 mg one each pm  if head congestion not resolved completely need a sinus CT > [name] Plan A:  Dulera 200 Take 2 puffs first thing in am and then another 2 puffs about 12 hours later.  Only use your albuterol (xopenex inhaler Plan B,  xopenex neb is plan C)  as a rescue medication   06/25/2013 f/u ov/Kazim Corrales re asthma Chief Complaint  Patient presents with  . Follow-up    Pt states overall her breathing is doing  well. She does notice has increased symptoms when she is outside alot.   rare need for saba as long as has access to dulera ,  Does still have Wheeze sob, and cough on exp to outdoors, cats and dogs. rec Dulera 200 Take 2 puffs first thing in am and then another 2 puffs about 12 hours later.  Stop singulair You will need to drug formulary to see if your company covers symbicort or advair better.      10/03/2013 f/u ov/Jmya Uliano re: asthma flare off dulera x sev weeks Chief Complaint  Patient presents with  . Acute Visit    Pt reports having increased wheezing and SOB- onset 2 days ago.  She states that she woke up in the night last night feeling out of breath and had to use proair with no relief. She has also had prod cough with large amounts of green sputum x 2 days.    used both hfa and neb in last 24  h rec Resume dulera 200 Take 2 puffs first thing in am and then another 2 puffs about 12 hours later - you cannot under any circumstances be off this medication or a similar maintenance inhaler given the severity of your asthma so call us if any problem acquiring it through your company  Only use your albuterol (proaire) as a rescue medication   12/03/2013 f/u ov/Donesha Wallander re: asthma Chief Complaint  Patient presents with  . Follow-up    Pt states that her breathing is overall doing well today. No new co's today.   when on dulera does fine s need for saba hfa/ neb but finding dulera too expensive under her commercial insurance plan  rec Stop dulera Start symbicort 160 Take 2 puffs first thing in am and then another 2 puffs about 12 hours later.  Only use your albuterol as a rescue medication    09/30/2014 f/u ov/Brightyn Mozer re: asthma/ doing great on singulair/symbicort 160 2bid but does not carry rescue any more Chief Complaint  Patient presents with  . Follow-up    Pt states that her breathing is doing well and denies any new co's today.   rec No change for now but may able to reduce the dose to 80/4.5 2 every 12 hours when you return if you continue to do so well Don't leave home without your rescue inhaler   03/31/2015 f/u ov/Mitch Arquette re: asthma/ rarely needing rescue Chief Complaint  Patient presents with  . Follow-up    allergies are bothering her at this time; hoarseness; prod cough w/green mucus; runny nose  decades of problems cat/dogs may be getting worse and all fm members own them  Worse symptoms since Jan 2016 with eyes puffy  Stopped singulair sev weeks prior to OV  As not effective > no change noted since then  Prednisone helped a lot completed  03/12/15  Has not tried zyrtec    Not limited by breathing from desired activities/ most of her symptoms are upper airway      No obvious day to day or daytime variabilty or assoc   cp or chest tightness, subjective wheeze overt   hb  symptoms. No unusual exp hx or h/o childhood pna/ asthma or knowledge of premature birth.  Sleeping ok without nocturnal  or early am exacerbation  of respiratory  c/o's or need for noct saba. Also denies any obvious fluctuation of symptoms with weather or environmental changes or other aggravating or alleviating factors except as outlined above  Current Medications, Allergies, Complete Past Medical History, Past Surgical History, Family History, and Social History were reviewed in [facility] electronic medical record.  ROS  The following are not active complaints unless bolded sore throat, dysphagia, dental problems, itching, sneezing,  nasal congestion or excess/ purulent secretions, ear ache,   fever, chills, sweats, unintended wt loss, pleuritic or exertional cp, hemoptysis,  orthopnea pnd or leg swelling, presyncope, palpitations, heartburn, abdominal pain, anorexia, nausea, vomiting, diarrhea  or change in bowel or urinary habits, change in stools or urine, dysuria,hematuria,  rash, arthralgias, visual complaints, headache, numbness weakness or ataxia or problems with walking or coordination,  change in mood/affect or memory.           Objective:   Physical Exam  08/17/2011  245 > 234 12/06/2012  > 234 12/25/2012 >  248 06/25/2013 > 10/03/2013  252 > 12/03/2013 263> 09/30/2014 288 > 03/31/2015   280   Anxious amb wf nad     Vital signs reviewed   HEENT: nl dentition, turbinates, and orophanx. Nl external ear canals without cough reflex   NECK :  without JVD/Nodes/TM/ nl carotid upstrokes bilaterally   LUNGS: clear to A and P    CV:  RRR  no s3 or murmur or increase in P2, no edema   ABD:  soft and nontender with nl excursion in the supine position. No bruits or organomegaly, bowel sounds nl  MS:  warm without deformities, calf tenderness, cyanosis or clubbing      cxr 11/28/12 Thoracic dextroscoliosis again noted. Mild basilar atelectasis.  No infiltrate or pulmonary  edema. Question trace right pleural  effusion with blunting of the right costophrenic angle.         Assessment & Plan:

## 2015-04-01 ENCOUNTER — Encounter: Payer: Self-pay | Admitting: Internal Medicine

## 2015-04-01 NOTE — Assessment & Plan Note (Signed)
-    Allergy profile   12/25/2012 > Eos 10%,  Ig E  222 Pos dogs/ cats/grass - 09/30/2014 p extensive coaching HFA effectiveness =    90% - stop singulair 03/06/2015 as pt reported no benefit - referred to allergy 4/11/6    I had an extended discussion with the patient reviewing all relevant studies completed to date and  lasting 15 to 20 minutes of a 25 minute visit on the following ongoing concerns:  1)   on symbicort 160 - All goals of chronic asthma control met including optimal function and elimination of symptoms with minimal need for rescue therapy.  Contingencies discussed in full including contacting this office immediately if not controlling the symptoms using the rule of two's.     2) her rhinitis is not well controlled and she could clearly do a better job of avoidance rec trial of zyrtec/ dymista / referred to allergy

## 2015-06-05 ENCOUNTER — Telehealth: Payer: Self-pay | Admitting: Internal Medicine

## 2015-06-05 NOTE — Telephone Encounter (Signed)
Spoke with pt. Samples will be left at the front for pick up. Nothing further was needed.

## 2015-06-10 ENCOUNTER — Institutional Professional Consult (permissible substitution): Payer: Managed Care, Other (non HMO) | Admitting: Internal Medicine

## 2015-09-18 ENCOUNTER — Other Ambulatory Visit: Payer: Self-pay | Admitting: Internal Medicine

## 2015-11-06 ENCOUNTER — Other Ambulatory Visit: Payer: Self-pay | Admitting: Internal Medicine

## 2015-12-21 DIAGNOSIS — J189 Pneumonia, unspecified organism: Secondary | ICD-10-CM

## 2015-12-21 HISTORY — DX: Pneumonia, unspecified organism: J18.9

## 2016-06-08 ENCOUNTER — Other Ambulatory Visit: Payer: Self-pay | Admitting: Internal Medicine

## 2016-06-11 ENCOUNTER — Telehealth: Payer: Self-pay | Admitting: Internal Medicine

## 2016-06-11 MED ORDER — BUDESONIDE-FORMOTEROL FUMARATE 160-4.5 MCG/ACT IN AERO: INHALATION_SPRAY | RESPIRATORY_TRACT | Status: DC

## 2016-06-11 NOTE — Telephone Encounter (Signed)
Patient came by the office to get a sample of Symbicort. She has not been seen since 2016, so I advised patient that she needs to schedule a follow up appointment.  Patient scheduled appointment to see Dr. Sherene SiresWert on 06/23/16.  Patient given sample and copay coupon for her 1 refill.    Nothing further needed.

## 2016-06-16 ENCOUNTER — Ambulatory Visit: Payer: Managed Care, Other (non HMO) | Admitting: Licensed Clinical Social Worker

## 2016-06-16 ENCOUNTER — Ambulatory Visit (INDEPENDENT_AMBULATORY_CARE_PROVIDER_SITE_OTHER): Payer: Managed Care, Other (non HMO) | Admitting: Licensed Clinical Social Worker

## 2016-06-16 DIAGNOSIS — F411 Generalized anxiety disorder: Secondary | ICD-10-CM | POA: Diagnosis not present

## 2016-06-16 DIAGNOSIS — F332 Major depressive disorder, recurrent severe without psychotic features: Secondary | ICD-10-CM

## 2016-06-17 ENCOUNTER — Ambulatory Visit: Payer: Managed Care, Other (non HMO) | Admitting: Family

## 2016-06-18 ENCOUNTER — Telehealth: Payer: Self-pay

## 2016-06-18 NOTE — Telephone Encounter (Signed)
Pre Visit Call attempted. Unable to leave message VM not set up yet.

## 2016-06-21 ENCOUNTER — Ambulatory Visit (INDEPENDENT_AMBULATORY_CARE_PROVIDER_SITE_OTHER): Payer: Managed Care, Other (non HMO) | Admitting: Medical

## 2016-06-21 ENCOUNTER — Encounter: Payer: Self-pay | Admitting: Medical

## 2016-06-21 VITALS — BP 110/59 | HR 86 | Temp 98.1°F | Ht 66.0 in | Wt 262.0 lb

## 2016-06-21 DIAGNOSIS — F329 Major depressive disorder, single episode, unspecified: Secondary | ICD-10-CM

## 2016-06-21 DIAGNOSIS — F32A Depression, unspecified: Secondary | ICD-10-CM

## 2016-06-21 DIAGNOSIS — J45909 Unspecified asthma, uncomplicated: Secondary | ICD-10-CM | POA: Diagnosis not present

## 2016-06-21 DIAGNOSIS — F411 Generalized anxiety disorder: Secondary | ICD-10-CM

## 2016-06-21 MED ORDER — SERTRALINE HCL 25 MG PO TABS: 25.0000 mg | ORAL_TABLET | Freq: Every day | ORAL | Status: DC

## 2016-06-21 MED ORDER — CLONAZEPAM 0.5 MG PO TABS: 0.5000 mg | ORAL_TABLET | Freq: Two times a day (BID) | ORAL | Status: DC | PRN

## 2016-06-21 NOTE — Patient Instructions (Addendum)
Your depression and anxiety recently flared/returned with husband moving to [place]. Will rx sertraline and low number of clonazepan to use sparingly. Since you also state psycholologist thought you had ADD and bipolar will refer to psychiatrist.  I will go ahead and refer you to psychiatrist.   Continue to see Dr. Sherene SiresWert for asthma  Follow up in 2 weeks or as needed

## 2016-06-21 NOTE — Progress Notes (Signed)
Subjective:    Patient ID: [name], female    DOB: 11/05/1981, 35 y.o.   MRN: [id number]  HPI  I have reviewed pt PMH, PSH, FH, Social History and Surgical History  Pt worked 2 days a week at [employer] before she quit her job, pt was exercising regularly but none since lost her job and [employer]ographer[employer] membership, occasional coffee and energy drinks, diet not healthy, 2 children 609 yo and 33 yo.  Pt states she has not had primary care MD in a while.  Pt has history of asthma and treated by Dr. Sherene SiresWert. Pt asthma is under controll. She will see him on wellness.  Pt has some tachycardia listed in problem list but does nt report history of this recently. In fact not aware of diagnosis.  Pt has history of anxiety and depression. Pt states Monday of last week psychologist with behavioral health. She was told possibly has ADD and Bipolar. Pt was told she likely needs to see psychiatrist. But she has to be referred by primary care. Pt states depression ans anxiety dx in 2015. 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 Prior pcp rx'd medication but she never took. Recently over past month over husband leaving. Husband just left for [place]. Husband surprised her and stated he wanted a divorce. Pt had to move back in to her parents house.  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 Some testing when younger showed ADD.    Review of Systems  Constitutional: Negative for fever, chills and fatigue.  Respiratory: Negative for cough, chest tightness and wheezing.   Cardiovascular: Negative for chest pain and palpitations.  Gastrointestinal: Negative for abdominal pain, constipation and blood in stool.  Genitourinary: Negative for dysuria and difficulty urinating.  Musculoskeletal: Negative for back pain.  Neurological: Negative for dizziness and headaches.  Hematological: Negative for adenopathy. Does not bruise/bleed easily.  Psychiatric/Behavioral: Positive for dysphoric mood. Negative for suicidal ideas  and confusion. The patient is nervous/anxious.     Past Medical History  Diagnosis Date  . Asthma   . Pneumonia   . History of chicken pox   . UTI (urinary tract infection)      Social History   Social History  . Marital Status: Divorced    Spouse Name: N/A  . Number of Children: 1  . Years of Education: N/A   Occupational History  . Stay at home Mother    Social History Main Topics  . Smoking status: Never Smoker   . Smokeless tobacco: Never Used  . Alcohol Use: No  . Drug Use: No  . Sexual Activity: Yes    Birth Control/ Protection: IUD   Other Topics Concern  . Not on file   Social History Narrative    Past Surgical History  Procedure Laterality Date  . Cesarean section  09/27/12    Family History  Problem Relation Age of Onset  . Allergies Sister     Allergies  Allergen Reactions  . Aspirin Hives  . Levaquin [Levofloxacin In D5w] Hives  . Pamprin [Apap-Pamabrom-Pyrilamine] Hives    Current Outpatient Prescriptions on File Prior to Visit  Medication Sig Dispense Refill  . albuterol (PROAIR HFA) 108 (90 BASE) MCG/ACT inhaler Inhale 2 puffs into the lungs every 6 (six) hours as needed for wheezing. 1 Inhaler 1  . Azelastine-Fluticasone (DYMISTA) 137-50 MCG/ACT SUSP Place 1 puff into the nose 2 (two) times daily. 6 g 0  . budesonide-formoterol (SYMBICORT) 160-4.5 MCG/ACT inhaler Take 2  puffs first thing in am and then another 2 puffs about 12 hours later. 1 Inhaler 0  . famotidine (PEPCID) 20 MG tablet TAKE ONE TABLET BY MOUTH AT BEDTIME 30 tablet 0  . omeprazole (PRILOSEC) 40 MG capsule TAKE ONE CAPSULE BY MOUTH EVERY DAY 30 capsule 0   No current facility-administered medications on file prior to visit.    BP 110/59 mmHg  Pulse 86  Temp(Src) 98.1 F (36.7 C) (Oral)  Ht 5\' 6"  (1.676 m)  Wt 262 lb (118.842 kg)  BMI 42.31 kg/m2  SpO2 93%       Objective:   Physical Exam  General Mental Status- Alert. General Appearance- Not in acute  distress.   Skin General: Color- Normal Color. Moisture- Normal Moisture.  Neck Carotid Arteries- Normal color. Moisture- Normal Moisture. No carotid bruits. No JVD.  Chest and Lung Exam Auscultation: Breath Sounds:-Normal.  Cardiovascular Auscultation:Rythm- Regular. Murmurs & Other Heart Sounds:Auscultation of the heart reveals- No Murmurs.  Abdomen Inspection:-Inspeection Normal. Palpation/Percussion:Note:No mass. Palpation and Percussion of the abdomen reveal- Non Tender, Non Distended + BS, no rebound or guarding.  Neurologic Cranial Nerve exam:- CN III-XII intact(No nystagmus), symmetric smile. Strength:- 5/5 equal and symmetric strength both upper and lower extremities.      Assessment & Plan:  Your depression and anxiety recently flared/returned with husband moving to [place]. Will rx sertraline and low number of clonazepan to use sparingly. Since you also state psycholologist thought you had ADD and bipolar will refer to psychiatrist.  I will go ahead and refer you to psychiatrist.   Continue to see Dr. Sherene SiresWert asthma.  Follow up in 2 weeks or as needed    Chante Mayson, Ramon DredgeEdward, [employer]F CorporationPA-C

## 2016-06-21 NOTE — Progress Notes (Signed)
Pre visit review using our clinic review tool, if applicable. No additional management support is needed unless otherwise documented below in the visit note. 

## 2016-06-23 ENCOUNTER — Encounter: Payer: Self-pay | Admitting: Internal Medicine

## 2016-06-23 ENCOUNTER — Ambulatory Visit (INDEPENDENT_AMBULATORY_CARE_PROVIDER_SITE_OTHER): Payer: Managed Care, Other (non HMO) | Admitting: Internal Medicine

## 2016-06-23 ENCOUNTER — Telehealth: Payer: Self-pay | Admitting: Internal Medicine

## 2016-06-23 VITALS — BP 124/74 | HR 94 | Ht 66.0 in | Wt 259.0 lb

## 2016-06-23 DIAGNOSIS — J4530 Mild persistent asthma, uncomplicated: Secondary | ICD-10-CM

## 2016-06-23 LAB — NITRIC OXIDE: NITRIC OXIDE: 32

## 2016-06-23 MED ORDER — OMEPRAZOLE 40 MG PO CPDR: 40.0000 mg | DELAYED_RELEASE_CAPSULE | Freq: Every day | ORAL | Status: DC

## 2016-06-23 MED ORDER — FAMOTIDINE 20 MG PO TABS: 20.0000 mg | ORAL_TABLET | Freq: Every day | ORAL | Status: DC

## 2016-06-23 MED ORDER — BUDESONIDE-FORMOTEROL FUMARATE 160-4.5 MCG/ACT IN AERO: INHALATION_SPRAY | RESPIRATORY_TRACT | Status: DC

## 2016-06-23 MED ORDER — FLUTICASONE PROPIONATE 50 MCG/ACT NA SUSP: 1.0000 | Freq: Two times a day (BID) | NASAL | Status: DC

## 2016-06-23 MED ORDER — AZELASTINE-FLUTICASONE 137-50 MCG/ACT NA SUSP: 1.0000 | Freq: Two times a day (BID) | NASAL | Status: DC

## 2016-06-23 NOTE — Patient Instructions (Signed)
Ok to just use the zyrtec as needed for itching/ sneezing/ runny nose   Please schedule a follow up visit in 12 months but call sooner if needed

## 2016-06-23 NOTE — Telephone Encounter (Signed)
That's fine - dose is one twice daily

## 2016-06-23 NOTE — Telephone Encounter (Signed)
Last ov with MW on  Patient Instructions       Ok to just use the zyrtec as needed for itching/ sneezing/ runny nose   Please schedule a follow up visit in 12 months but call sooner if needed     Pt states she was given a prescription for dymista but when she took it to the pharmacy it was not covered under her insurance. She states that it does cover fluticasone propionate. She is [facility]. I explained that I would need to send a message to MW for approval. She voiced understanding and had no further questions.   MW please advise

## 2016-06-23 NOTE — Telephone Encounter (Signed)
Pt returning and can be reached @ [phone].[name]

## 2016-06-23 NOTE — Telephone Encounter (Signed)
Spoke with pt and advised that MW has approved Flonase. Reviewed use instructions. Rx sent. Nothing further needed.

## 2016-06-23 NOTE — Telephone Encounter (Signed)
LMTCB

## 2016-06-23 NOTE — Progress Notes (Signed)
Subjective:    Patient ID: [name], female    DOB: Aug 19, 1981   MRN: [id number]    Brief patient profile:  35   yowf dx with asthma age 35 with good control including nl pregnancy while on advair 250 in 2007   History of Present Illness  08/17/2011 Initial pulmonary office eval in EMR era cc asthma worse since 2009 p mold exposure thru Feb 2012 when left that  House> moved to [place] p admitted to [facility] Feb 35 and discharged initially on advair for did ok for  several weeks while on prednisone then downhill since > sev ER trips and has developed neb dependence -Tried symbicort and dulera through urgent.  Took 2 puffs of dulera Nicole of ov but still needed neb 2hours later. rec Dulera 100 Take 2 puffs first thing in Nicole and then another 2 puffs about 12 hours later.  Work on [employer], commercial:   Prednisone 10 mg take  4 each Nicole x 2 days,   2 each Nicole x 2 days,  1 each Nicole x2days and stop  If can't catch your breath ok to ventolin up to 2 puffs and only if this doesn't work ok to use the nebulizer but stop using the spacer Prilosec   Take 30-60 min before first meal of the day and Pepcid 20 mg one bedtime      Admit date: 11/26/2012  Discharge date: 11/29/2012  Discharge Diagnoses:   ASTHMA, WITH ACUTE EXACERBATION   Asthmatic bronchitis  Hypokalemia  URI, acute  Leukocytosis  Dehydration  Sinus tachycardia  12/06/2012 post hosp f/u ov/[name]  back to baseline x still using too much saba "the way she was in the hospital" with minimal residual nasal and ear congestion, no purulent sputum, almost all done with prednisone. rec Plan A Dulera 100 Take 2 puffs first thing in Nicole and then another 2 puffs about 12 hours later.  Only use your albuterol (xopenex inhaler Plan B,  xopenex neb is plan C)  as a rescue medication  GERD diet  12/25/2012 f/u ov/[name] cc some better then  C/o gradual increased SOB and wheezing x 2 wks, worse for the past couple days. Still  has prod cough with dark green sputum since the last visit here. In retrospect never cleared nasal congestion and not using xopenex at all then started using hfa daytime and neb at night.  rec Augmentin 875 twice daily x 10 days and if head congestion not resolved completely need a sinus CT > [name] Plan A:  Dulera 200 Take 2 puffs first thing in Nicole and then another 2 puffs about 12 hours later.  Only use your albuterol (xopenex inhaler Plan B,  xopenex neb is plan C)    01/29/2013 f/u ov/Amera Stanton cc never got CT, never got completely bettter with nasal congestion but much better vs last ov and very little need for saba.  Did not call for ct sinus as rec rec singulair 10 mg one each pm  if head congestion not resolved completely need a sinus CT > [name] Plan A:  Dulera 200 Take 2 puffs first thing in Nicole and then another 2 puffs about 12 hours later.  Only use your albuterol (xopenex inhaler Plan B,  xopenex neb is plan C)  as a rescue medication   06/25/2013 f/u ov/[name] re asthma Chief Complaint  Patient presents with  . Follow-up    Pt states overall her breathing  is doing well. She does notice has increased symptoms when she is outside alot.   rare need for saba as long as has access to dulera ,  Does still have Wheeze sob, and cough on exp to outdoors, cats and dogs. rec Dulera 200 Take 2 puffs first thing in Nicole and then another 2 puffs about 12 hours later.  Stop singulair You will need to drug formulary to see if your company covers symbicort or advair better.      10/03/2013 f/u ov/[name] re: asthma flare off dulera x sev weeks Chief Complaint  Patient presents with  . Acute Visit    Pt reports having increased wheezing and SOB- onset 2 days ago.  She states that she woke up in the night last night feeling out of breath and had to use proair with no relief. She has also had prod cough with large amounts of green sputum x 2 days.    used both hfa and neb in last 24  h rec Resume dulera 200 Take 2 puffs first thing in Nicole and then another 2 puffs about 12 hours later - you cannot under any circumstances be off this medication or a similar maintenance inhaler given the severity of your asthma so call us if any problem acquiring it through your company  Only use your albuterol (proaire) as a rescue medication   12/03/2013 f/u ov/[name] re: asthma Chief Complaint  Patient presents with  . Follow-up    Pt states that her breathing is overall doing well today. No new co's today.   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  Only use your albuterol as a rescue medication    09/30/2014 f/u ov/[name] re: asthma/ doing great on singulair/symbicort 160 2bid but does not carry rescue any more Chief Complaint  Patient presents with  . Follow-up    Pt states that her breathing is doing well and denies any new co's today.   rec No change for now but may able to reduce the dose to 80/4.5 2 every 12 hours when you return if you continue to do so well Don't leave home without your rescue inhaler   03/31/2015 f/u ov/[name] re: asthma/ rarely needing rescue Chief Complaint  Patient presents with  . Follow-up    allergies are bothering her at this time; hoarseness; prod cough w/green mucus; runny nose  decades of problems cat/dogs may be getting worse and all fm members own them  Worse symptoms since Jan 2016 with eyes puffy  Stopped singulair sev weeks prior to OV  As not effective > no change noted since then  Prednisone helped a lot completed  03/12/15  Has not tried zyrtec  rec Try dymista one twice daily each nostil until seen again here - don't fill rx if not helping and need for zyrtec declines  Try zyrtec 10 mg daily as needed for itchy/ sneezy/ runny nose  We will refill  you albuterol to use as needed  and your maintenance inhaler = symbicort 160      06/23/2016  f/u ov/[name] re:  Asthma/ rx maint on symb 160/ dymista / no flares since last ov  Chief Complaint  Patient presents with  . Follow-up    Breathing is doing well.  She is coughing  less. She rarely uses albuterol.    also good control of rhinitis on dymista/ stays on zyrtec even when better  Not limited by breathing from desired activities/ most of her symptoms are upper airway      No obvious day to day or daytime variabilty or assoc   cp or chest tightness, subjective wheeze overt   hb symptoms. No unusual exp hx or h/o childhood pna/ asthma or knowledge of premature birth.  Sleeping ok without nocturnal  or early Nicole exacerbation  of respiratory  c/o's or need for noct saba. Also denies any obvious fluctuation of symptoms with weather or environmental changes or other aggravating or alleviating factors except as outlined above   Current Medications, Allergies, Complete Past Medical History, Past Surgical History, Family History, and Social History were reviewed in [facility] electronic medical record.  ROS  The following are not active complaints unless bolded sore throat, dysphagia, dental problems, itching, sneezing,  nasal congestion or excess/ purulent secretions, ear ache,   fever, chills, sweats, unintended wt loss, pleuritic or exertional cp, hemoptysis,  orthopnea pnd or leg swelling, presyncope, palpitations, heartburn, abdominal pain, anorexia, nausea, vomiting, diarrhea  or change in bowel or urinary habits, change in stools or urine, dysuria,hematuria,  rash, arthralgias, visual complaints, headache, numbness weakness or ataxia or problems with walking or coordination,  change in mood/affect or memory.           Objective:   Physical Exam  08/17/2011  245 > 234 12/06/2012  > 234 12/25/2012 >  248 06/25/2013 > 10/03/2013  252 > 12/03/2013 263> 09/30/2014 288 > 03/31/2015   280  >   06/23/2016   259   amb wf nad     Vital signs reviewed   HEENT: nl dentition, turbinates, and orophanx. Nl external ear canals without cough reflex   NECK :  without JVD/Nodes/TM/ nl carotid upstrokes bilaterally   LUNGS: clear to A and P    CV:  RRR  no s3 or murmur or increase in P2, no edema   ABD:  soft and nontender with nl excursion in the supine position. No bruits or organomegaly, bowel sounds nl  MS:  warm without deformities, calf tenderness, cyanosis or clubbing             Assessment & Plan:

## 2016-06-24 NOTE — Telephone Encounter (Signed)
I have never seen pt. So please take me off as her pcp. Should see pt first before being designated. Please send me note confirming this was done.

## 2016-06-24 NOTE — Assessment & Plan Note (Signed)
-    Allergy profile   12/25/2012 > Eos 10%,  Ig E  222 Pos dogs/ cats/grass - 09/30/2014 p extensive coaching HFA effectiveness =    90% - stop singulair 03/06/2015 as pt reported no benefit - referred to allergy 4/11/6 > cancelled as much improved on symb/dysmista - FENO 06/23/2016  =   32 on symb 160 2bid > no changes needed   All goals of chronic asthma control met including optimal function and elimination of symptoms with minimal need for rescue therapy.  FENO is borderline so not "diagnostic" per se of over or under treatment  here  but suggests we should not reduce ICS further at this point as risk a much higher FENO on lower doses of ics eg higher airways eos inflammation.   Contingencies discussed in full including contacting this office immediately if not controlling the symptoms using the rule of two's.     Each maintenance medication was reviewed in detail including most importantly the difference between maintenance and as needed and under what circumstances the prns are to be used.  Please see instructions for details which were reviewed in writing and the patient given a copy.

## 2016-06-24 NOTE — Assessment & Plan Note (Signed)
Body mass index is 41.82    Lab Results  Component Value Date   TSH 2.06 08/13/2014     Contributing to gerd tendency/ doe/reviewed the need and the process to achieve and maintain neg calorie balance > defer f/u primary care including intermittently monitoring thyroid status

## 2016-06-25 NOTE — Telephone Encounter (Signed)
I cannot remove you from this patient's pcp because she established with yo 06/21/16

## 2016-06-25 NOTE — Telephone Encounter (Signed)
Did not see that. Sorry. Sometime people get added to me randomly. All I saw late last night was Wert.

## 2016-07-01 ENCOUNTER — Telehealth: Payer: Self-pay | Admitting: *Deleted

## 2016-07-01 NOTE — Telephone Encounter (Signed)
PA for symbicort 160 mcg Called [employer] at [phone] ID # [id number]  Called spoke with PA rep Nicole Lofterry PA was approved for 12 months. Approval # [id number]  I called [facility] pharmacy and made aware of approval. Nothing further needed

## 2016-07-06 ENCOUNTER — Ambulatory Visit (INDEPENDENT_AMBULATORY_CARE_PROVIDER_SITE_OTHER): Payer: Managed Care, Other (non HMO) | Admitting: Medical

## 2016-07-06 ENCOUNTER — Encounter: Payer: Self-pay | Admitting: Medical

## 2016-07-06 VITALS — BP 122/76 | HR 87 | Temp 98.1°F | Ht 66.0 in | Wt 259.0 lb

## 2016-07-06 DIAGNOSIS — R5383 Other fatigue: Secondary | ICD-10-CM

## 2016-07-06 DIAGNOSIS — F32A Depression, unspecified: Secondary | ICD-10-CM

## 2016-07-06 DIAGNOSIS — F329 Major depressive disorder, single episode, unspecified: Secondary | ICD-10-CM | POA: Diagnosis not present

## 2016-07-06 DIAGNOSIS — R41840 Attention and concentration deficit: Secondary | ICD-10-CM | POA: Diagnosis not present

## 2016-07-06 LAB — COMPREHENSIVE METABOLIC PANEL
ALBUMIN: 4.2 g/dL (ref 3.5–5.2)
ALK PHOS: 71 U/L (ref 39–117)
ALT: 19 U/L (ref 0–35)
AST: 18 U/L (ref 0–37)
BILIRUBIN TOTAL: 0.3 mg/dL (ref 0.2–1.2)
BUN: 14 mg/dL (ref 6–23)
CALCIUM: 9.2 mg/dL (ref 8.4–10.5)
CHLORIDE: 103 meq/L (ref 96–112)
CO2: 30 mEq/L (ref 19–32)
CREATININE: 0.73 mg/dL (ref 0.40–1.20)
GFR: 97.29 mL/min (ref >60.00)
Glucose, Bld: 90 mg/dL (ref 70–99)
Potassium: 3.7 mEq/L (ref 3.5–5.1)
SODIUM: 137 meq/L (ref 135–145)
TOTAL PROTEIN: 7.2 g/dL (ref 6.0–8.3)

## 2016-07-06 LAB — TSH: TSH: 2.15 u[IU]/mL (ref 0.35–4.50)

## 2016-07-06 MED ORDER — SERTRALINE HCL 25 MG PO TABS: 25.0000 mg | ORAL_TABLET | Freq: Every day | ORAL | Status: DC

## 2016-07-06 NOTE — Patient Instructions (Signed)
Your depression seems better. Continue the sertraline. If you get anxious can use your clonazepam.   For your fatigue will get cbc, cmp and tsh.  You may have ADD. I want you to call the psychiatrist office. I have put in referral. If there is a delay please let me know. Based on your psychologist opinion of bi-polar dx, I would like to know if psychiatrist agrees with this diagnosis before prescribing ADD type meds.  Follow up in 1-2 month or as needed.

## 2016-07-06 NOTE — Progress Notes (Signed)
   Subjective:    Patient ID: [name], female    DOB: 1981/08/11, 35 y.o.   MRN: [id number]  HPI  Pt in for a follow up. She states some fatigue recently. Pt states some fatigue since last visit.  Pt thinks medications is working. Not feeling depressed(but describes lack of motivation). She states not feeling anxious since last visit. Pt has taken clonazepam twice sine I last saw her. She when her husband is in town she will fel more anxious.   Husband still in [place]. He has not called or texted pt.   Pt expresse concern for ADD. Psychologist in past [facility] thought pt had bipolar. (I discussed this with her. Pt tends to doubt bipolar dx but Darl PikesSusan bond psychologist thought this was the case per pt?)  Pt describes being easily distracted. Never getting any projects done. She feels like she never gets anything done. Se states very disorganized. Pt when she was younger was told ADD. Her parents did not want her to be treated. Pt did poorly in school.  LMP- has mirena   Review of Systems  Constitutional: Negative for fever, chills and fatigue.  Respiratory: Negative for cough, choking and wheezing.   Cardiovascular: Negative for chest pain and palpitations.  Gastrointestinal: Negative for abdominal pain.  Musculoskeletal: Negative for back pain.  Neurological: Negative for dizziness and light-headedness.  Hematological: Negative for adenopathy. Does not bruise/bleed easily.  Psychiatric/Behavioral: Positive for dysphoric mood and decreased concentration. Negative for suicidal ideas, sleep disturbance and self-injury. The patient is not nervous/anxious.        Objective:   Physical Exam   General Mental Status- Alert. General Appearance- Not in acute distress.   Skin General: Color- Normal Color. Moisture- Normal Moisture.  Neck Carotid Arteries- Normal color. Moisture- Normal Moisture. No carotid bruits. No JVD.  Chest and Lung Exam Auscultation: Breath  Sounds:-Normal.  Cardiovascular Auscultation:Rythm- Regular. Murmurs & Other Heart Sounds:Auscultation of the heart reveals- No Murmurs.  Abdomen Inspection:-Inspeection Normal. Palpation/Percussion:Note:No mass. Palpation and Percussion of the abdomen reveal- Non Tender, Non Distended + BS, no rebound or guarding.   Neurologic Cranial Nerve exam:- CN III-XII intact(No nystagmus), symmetric smile. Strength:- 5/5 equal and symmetric strength both upper and lower extremities.       Assessment & Plan:  Your depression seems better. Continue the sertraline. If you get anxious can use your clonazepam.   For your fatigue will get cbc, cmp and tsh.  You may have ADD. I want you to call the psychiatrist office. I have put in referral. If there is a delay please let me know. Based on your psychologist opinion of bi-polar dx, I would like to know if psychiatrist agrees with this diagnosis before prescribing ADD type meds.  Follow up in 1-2 month or as needed.  Lakesha Levinson, Ramon DredgeEdward, PA-C

## 2016-07-07 ENCOUNTER — Telehealth: Payer: Self-pay | Admitting: Medical

## 2016-07-07 ENCOUNTER — Ambulatory Visit: Payer: Self-pay | Admitting: Medical

## 2016-07-07 LAB — CBC WITH DIFFERENTIAL/PLATELET
BASOS ABS: 0 10*3/uL (ref 0.0–0.1)
BASOS PCT: 0.1 % (ref 0.0–3.0)
Eosinophils Absolute: 0.4 10*3/uL (ref 0.0–0.7)
Eosinophils Relative: 3.9 % (ref 0.0–5.0)
HCT: 40.2 % (ref 36.0–46.0)
Hemoglobin: 13.5 g/dL (ref 12.0–15.0)
LYMPHS ABS: 1.7 10*3/uL (ref 0.7–4.0)
Lymphocytes Relative: 17.3 % (ref 12.0–46.0)
MCHC: 33.5 g/dL (ref 30.0–36.0)
MCV: 89.8 fl (ref 78.0–100.0)
MONOS PCT: 2.7 % — AB (ref 3.0–12.0)
Monocytes Absolute: 0.3 10*3/uL (ref 0.1–1.0)
NEUTROS ABS: 7.5 10*3/uL (ref 1.4–7.7)
NEUTROS PCT: 76 % (ref 43.0–77.0)
PLATELETS: 280 10*3/uL (ref 150.0–400.0)
RBC: 4.48 Mil/uL (ref 3.87–5.11)
RDW: 13 % (ref 11.5–15.5)
WBC: 9.8 10*3/uL (ref 4.0–10.5)

## 2016-07-07 NOTE — Telephone Encounter (Signed)
Please advise if you can get her in sooner.

## 2016-07-07 NOTE — Telephone Encounter (Signed)
Pt called in because she says that she was seen by PA and advised to see a phycologist. Pt says that she is unable to get an appt until Sept 26. Pt says that provider informed her to call him back and let him know if she's unable to get in sooner for alternatives.    Please assist further.    Pt says that provider wants to f/u with her, please advise when?

## 2016-07-07 NOTE — Telephone Encounter (Signed)
I advised pt that she should see psychiatrist not psychologist. So this needs to be cleared up first. If she can verify that she can see psychiatrist and that appointment made and she will attend then I am willing to have her come in and see me for ADD visit. She can answer add questioneer. And I will write low dose vyvanse. But will only do this is she verify the appointment on Sept 26 ,2017 is with psychiatrist. Pt has already seen psychologist.

## 2016-07-08 NOTE — Telephone Encounter (Signed)
Spoke with pt and she has a follow up with Ramon DredgeEdward on 07/14/16.

## 2016-07-14 ENCOUNTER — Telehealth: Payer: Self-pay | Admitting: Medical

## 2016-07-14 ENCOUNTER — Ambulatory Visit (INDEPENDENT_AMBULATORY_CARE_PROVIDER_SITE_OTHER): Payer: Managed Care, Other (non HMO) | Admitting: Medical

## 2016-07-14 ENCOUNTER — Encounter: Payer: Self-pay | Admitting: Medical

## 2016-07-14 VITALS — BP 110/74 | HR 67 | Temp 98.1°F | Ht 66.0 in | Wt 260.2 lb

## 2016-07-14 DIAGNOSIS — F411 Generalized anxiety disorder: Secondary | ICD-10-CM | POA: Diagnosis not present

## 2016-07-14 DIAGNOSIS — F909 Attention-deficit hyperactivity disorder, unspecified type: Secondary | ICD-10-CM | POA: Diagnosis not present

## 2016-07-14 DIAGNOSIS — F32A Depression, unspecified: Secondary | ICD-10-CM

## 2016-07-14 DIAGNOSIS — F329 Major depressive disorder, single episode, unspecified: Secondary | ICD-10-CM | POA: Diagnosis not present

## 2016-07-14 MED ORDER — AMPHETAMINE-DEXTROAMPHET ER 20 MG PO CP24: 20.0000 mg | ORAL_CAPSULE | Freq: Every day | ORAL | 0 refills | Status: DC

## 2016-07-14 MED ORDER — LISDEXAMFETAMINE DIMESYLATE 20 MG PO CAPS: 20.0000 mg | ORAL_CAPSULE | Freq: Every day | ORAL | 0 refills | Status: DC

## 2016-07-14 NOTE — Addendum Note (Signed)
Addended by: Neldon Labella on: 07/14/2016 12:09 PM   Modules accepted: Orders

## 2016-07-14 NOTE — Progress Notes (Signed)
Subjective:    Patient ID: [name], female    DOB: January 26, 1981, 35 y.o.   MRN: [id number]  HPI  Pt in for ADHD eval. She had mentioned hx of this before on past 2 visits. She has some depression and anxiety. Pt psychologist in recent past mentioned she may be bipolar per pt report. For that reason I had asked her to schedule appointment with psychiatrist. She has appointment with psych end of September. She will keep that. My general impression on meeting her was that she did not appear bipolar. But if she would would potentially be on stimulant type med for ADHD I wanted at some point psychiatrist evaluation.  Pt scored a lot of often and very often on adult adhd scale.  Pt mood and anxiety stable presently.   Review of Systems  Constitutional: Negative for chills, fatigue and fever.  HENT: Negative for congestion.   Respiratory: Negative for cough, chest tightness, shortness of breath and wheezing.   Cardiovascular: Negative for chest pain and palpitations.  Gastrointestinal: Negative for abdominal pain.  Genitourinary: Negative for difficulty urinating and flank pain.  Musculoskeletal: Negative for back pain.  Psychiatric/Behavioral: Positive for decreased concentration and dysphoric mood. Negative for agitation and suicidal ideas. The patient is nervous/anxious.        Mood has improved recently.   Past Medical History:  Diagnosis Date  . Asthma   . History of chicken pox   . Pneumonia   . UTI (urinary tract infection)      Social History   Social History  . Marital status: Divorced    Spouse name: N/A  . Number of children: 1  . Years of education: N/A   Occupational History  . Stay at home [employer]   Social History Main Topics  . Smoking status: Never Smoker  . Smokeless tobacco: Never Used  . Alcohol use No  . Drug use: No  . Sexual activity: Yes    Birth control/ protection: IUD   Other Topics Concern  . Not on file   Social  History Narrative  . No narrative on file    Past Surgical History:  Procedure Laterality Date  . CESAREAN SECTION  09/27/12    Family History  Problem Relation Age of Onset  . Allergies Sister     Allergies  Allergen Reactions  . Aspirin Hives  . Levaquin [Levofloxacin In D5w] Hives  . Pamprin [Apap-Pamabrom-Pyrilamine] Hives    Current Outpatient Prescriptions on File Prior to Visit  Medication Sig Dispense Refill  . albuterol (PROAIR HFA) 108 (90 BASE) MCG/ACT inhaler Inhale 2 puffs into the lungs every 6 (six) hours as needed for wheezing. 1 Inhaler 1  . budesonide-formoterol (SYMBICORT) 160-4.5 MCG/ACT inhaler Take 2 puffs first thing in am and then another 2 puffs about 12 hours later. 1 Inhaler 11  . clonazePAM (KLONOPIN) 0.5 MG tablet Take 1 tablet (0.5 mg total) by mouth 2 (two) times daily as needed for anxiety. 20 tablet o  . famotidine (PEPCID) 20 MG tablet Take 1 tablet (20 mg total) by mouth at bedtime. 30 tablet 11  . fluticasone (FLONASE) 50 MCG/ACT nasal spray Place 1 spray into both nostrils 2 (two) times daily. 16 g 5  . omeprazole (PRILOSEC) 40 MG capsule Take 1 capsule (40 mg total) by mouth daily. 30 capsule 11  . sertraline (ZOLOFT) 25 MG tablet Take 1 tablet (25 mg total) by mouth daily. 30 tablet 3   No current  facility-administered medications on file prior to visit.     BP 110/74 (BP Location: Left Arm, Patient Position: Sitting, Cuff Size: Large)   Pulse 67   Temp 98.1 F (36.7 C) (Oral)   Ht  (1.676 m)   Wt 260 lb 3.2 oz (118 kg)   SpO2 98%   BMI 42.00 kg/m       Objective:   Physical Exam  General Mental Status- Alert. General Appearance- Not in acute distress.   Skin General: Color- Normal Color. Moisture- Normal Moisture.  Neck Carotid Arteries- Normal color. Moisture- Normal Moisture. No carotid bruits. No JVD.  Chest and Lung Exam Auscultation: Breath Sounds:-Normal.  Cardiovascular Auscultation:Rythm-  Regular. Murmurs & Other Heart Sounds:Auscultation of the heart reveals- No Murmurs.   Neurologic Cranial Nerve exam:- CN III-XII intact(No nystagmus), symmetric smile. Strength:- 5/5 equal and symmetric strength both upper and lower extremities.      Assessment & Plan:  You do appear to had ADHD by your reported history and answers on screening questionnaire. I am going to go ahead and prescribe vyvanse. Will start with low dose. Assess if effects mood. Still see psychiatrist end of September as already scheduled.  Depression and anxiety appear improved some/stable. Continue sertraline and clonazepam.   Follow up in 1 month or as needed  Rx advisement given on vyvanse  Placed ADHD questionnaire to be scanned.  Delmon Andrada, Ramon Dredge, PA-C

## 2016-07-14 NOTE — Telephone Encounter (Signed)
Opened by accident. Note finished on day of visit.

## 2016-07-14 NOTE — Patient Instructions (Addendum)
You do appear to had ADHD by your reported history and answers on screening questionnaire. I am going to go ahead and prescribe vyvanse. Will start with low dose. Assess if effects mood. Still see psychiatrist end of September as already scheduled.  Depression and anxiety appear improved some/stable. Continue sertraline and clonazepam.   Follow up in 1 month or as needed

## 2016-07-20 ENCOUNTER — Other Ambulatory Visit: Payer: Self-pay | Admitting: Medical

## 2016-07-20 NOTE — Telephone Encounter (Signed)
This was filled 07/06/16 w/ 3 additional refills. Called pt and notified her to contact the pharmacy to fill the prescription and she verbalized understanding.

## 2016-07-20 NOTE — Telephone Encounter (Signed)
Refill for Zoloft    Pharmacy:  [facility] - [place], [place] - [address]   Pt says that she is out of her medication.    Pt would like to know if someone could call her when medication is sent to pharmacy

## 2016-08-13 ENCOUNTER — Encounter: Payer: Self-pay | Admitting: Medical

## 2016-08-13 ENCOUNTER — Ambulatory Visit (INDEPENDENT_AMBULATORY_CARE_PROVIDER_SITE_OTHER): Payer: Managed Care, Other (non HMO) | Admitting: Medical

## 2016-08-13 VITALS — BP 110/78 | HR 106 | Temp 97.7°F | Ht 66.0 in | Wt 261.0 lb

## 2016-08-13 DIAGNOSIS — J0100 Acute maxillary sinusitis, unspecified: Secondary | ICD-10-CM

## 2016-08-13 DIAGNOSIS — J309 Allergic rhinitis, unspecified: Secondary | ICD-10-CM | POA: Diagnosis not present

## 2016-08-13 DIAGNOSIS — F411 Generalized anxiety disorder: Secondary | ICD-10-CM

## 2016-08-13 DIAGNOSIS — F909 Attention-deficit hyperactivity disorder, unspecified type: Secondary | ICD-10-CM | POA: Diagnosis not present

## 2016-08-13 DIAGNOSIS — H6693 Otitis media, unspecified, bilateral: Secondary | ICD-10-CM | POA: Diagnosis not present

## 2016-08-13 MED ORDER — AZITHROMYCIN 250 MG PO TABS: ORAL_TABLET | ORAL | 0 refills | Status: DC

## 2016-08-13 MED ORDER — BENZONATATE 100 MG PO CAPS
100.0000 mg | ORAL_CAPSULE | Freq: Three times a day (TID) | ORAL | 0 refills | Status: DC | PRN
Start: 2016-08-13 — End: 2016-10-18

## 2016-08-13 MED ORDER — AMPHETAMINE-DEXTROAMPHET ER 30 MG PO CP24: 30.0000 mg | ORAL_CAPSULE | Freq: Every day | ORAL | 0 refills | Status: DC

## 2016-08-13 MED ORDER — SERTRALINE HCL 50 MG PO TABS: 50.0000 mg | ORAL_TABLET | Freq: Every day | ORAL | 1 refills | Status: DC

## 2016-08-13 NOTE — Progress Notes (Signed)
Pre visit review using our clinic tool,if applicable. No additional management support is needed unless otherwise documented below in the visit note.  

## 2016-08-13 NOTE — Addendum Note (Signed)
Addended by: Gwenevere AbbotSAGUIER, Zilla Shartzer M on: 08/13/2016 10:13 AM   Modules accepted: Orders

## 2016-08-13 NOTE — Progress Notes (Addendum)
Subjective:    Patient ID: [name], female    DOB: January 17, 1981, 35 y.o.   MRN: [id number]  HPI  Pt in for ear fullness left side. Pt has had some nasal congestion for about a week. Ear pressure for 4 days. Feels some pnd. Some cough dry and productive. Pt has some hx of some fall allergies. Pt has some sinus pressure.  She update me that sertraline helping some with anxiety. She is on low dose 25 mg. She wants increase in dose.  Pt also on adderal. She has been on med one month. Put her on low dose with plan to increase incrementally. She feels like has helped her focus and attention but could be better.  Mirena-iud.    Review of Systems  Constitutional: Negative for chills, fatigue and fever.  HENT: Positive for congestion, postnasal drip and sinus pressure. Negative for sore throat.   Respiratory: Positive for cough. Negative for chest tightness, shortness of breath and wheezing.   Cardiovascular: Negative for chest pain and palpitations.  Gastrointestinal: Negative for abdominal pain, constipation, diarrhea and vomiting.  Musculoskeletal: Negative for back pain.  Neurological: Negative for dizziness and headaches.  Hematological: Negative for adenopathy. Does not bruise/bleed easily.  Psychiatric/Behavioral: Positive for decreased concentration. Negative for agitation, behavioral problems, dysphoric mood, self-injury and suicidal ideas. The patient is nervous/anxious. The patient is not hyperactive.        Decreased concentration but improved.  Anxiety getting better but could be better.  Mood is better recently.    Past Medical History:  Diagnosis Date  . Asthma   . History of chicken pox   . Pneumonia   . UTI (urinary tract infection)      Social History   Social History  . Marital status: Divorced    Spouse name: N/A  . Number of children: 1  . Years of education: N/A   Occupational History  . Stay at home [employer]   Social  History Main Topics  . Smoking status: Never Smoker  . Smokeless tobacco: Never Used  . Alcohol use No  . Drug use: No  . Sexual activity: Yes    Birth control/ protection: IUD   Other Topics Concern  . Not on file   Social History Narrative  . No narrative on file    Past Surgical History:  Procedure Laterality Date  . CESAREAN SECTION  09/27/12    Family History  Problem Relation Age of Onset  . Allergies Sister     Allergies  Allergen Reactions  . Aspirin Hives  . Levaquin [Levofloxacin In D5w] Hives  . Pamprin [Apap-Pamabrom-Pyrilamine] Hives    Current Outpatient Prescriptions on File Prior to Visit  Medication Sig Dispense Refill  . albuterol (PROAIR HFA) 108 (90 BASE) MCG/ACT inhaler Inhale 2 puffs into the lungs every 6 (six) hours as needed for wheezing. 1 Inhaler 1  . amphetamine-dextroamphetamine (ADDERALL XR) 20 MG 24 hr capsule Take 1 capsule (20 mg total) by mouth daily. Take 1 tablet by mouth Daily. 30 capsule 0  . budesonide-formoterol (SYMBICORT) 160-4.5 MCG/ACT inhaler Take 2 puffs first thing in am and then another 2 puffs about 12 hours later. 1 Inhaler 11  . clonazePAM (KLONOPIN) 0.5 MG tablet Take 1 tablet (0.5 mg total) by mouth 2 (two) times daily as needed for anxiety. 20 tablet o  . famotidine (PEPCID) 20 MG tablet Take 1 tablet (20 mg total) by mouth at bedtime. 30 tablet 11  .  fluticasone (FLONASE) 50 MCG/ACT nasal spray Place 1 spray into both nostrils 2 (two) times daily. 16 g 5  . omeprazole (PRILOSEC) 40 MG capsule Take 1 capsule (40 mg total) by mouth daily. 30 capsule 11  . sertraline (ZOLOFT) 25 MG tablet Take 1 tablet (25 mg total) by mouth daily. 30 tablet 3   No current facility-administered medications on file prior to visit.     BP 110/78   Pulse (!) 106   Temp 97.7 F (36.5 C) (Oral)   Ht 5\' 6"  (1.676 m)   Wt 261 lb (118.4 kg)   SpO2 97%   BMI 42.13 kg/m       Objective:   Physical Exam  General  Mental Status -  Alert. General Appearance - Well groomed. Not in acute distress.  Skin Rashes- No Rashes.  HEENT Head- Normal. Ear Auditory Canal - Left- Normal. Right - Normal.Tympanic Membrane- Left- red/dull tm . Right- red dull tm Eye Sclera/Conjunctiva- Left- Normal. Right- Normal. Nose & Sinuses Nasal Mucosa- Left-  Boggy and Congested. Right-  Boggy and  Congested.Bilateral maxillary and frontal sinus pressure.(worse left side. Mouth & Throat Lips: Upper Lip- Normal: no dryness, cracking, pallor, cyanosis, or vesicular eruption. Lower Lip-Normal: no dryness, cracking, pallor, cyanosis or vesicular eruption. Buccal Mucosa- Bilateral- No Aphthous ulcers. Oropharynx- No Discharge or Erythema. +pnd. Tonsils: Characteristics- Bilateral- No Erythema or Congestion. Size/Enlargement- Bilateral- No enlargement. Discharge- bilateral-None.  Neck Neck- Supple. No Masses.   Chest and Lung Exam Auscultation: Breath Sounds:-Clear even and unlabored.  Cardiovascular Auscultation:Rythm- Regular, rate and rhythm. Murmurs & Other Heart Sounds:Ausculatation of the heart reveal- No Murmurs.  Lymphatic Head & Neck General Head & Neck Lymphatics: Bilateral: Description- No Localized lymphadenopathy.       Assessment & Plan:  You appear to have some allergic rhinitis is signs and symptoms recently. For allergies will rx flonase.  Also OM and sinus infection as well. Rx azithromycin. For cough benzonatate.  For ADD increase ADD med dose today.  For anxiety will increase sertraline increase dose as well.  Follow up 10-14 days or as needed  Notify us in one month how you are feeling regarding anxiety and concentration.  Note controlled med contract signed and UDS done today.  Not pt is not using clonzepam daily. Only used one tab since rx written.   Kella Splinter, Ramon DredgeEdward, PA-C

## 2016-08-13 NOTE — Patient Instructions (Addendum)
You appear to have some allergic rhinitis is signs and symptoms recently. For allergies will rx flonase.  Also OM and sinus infection as well. Rx azithromycin. For cough benzonatate.  For ADD increase ADD med dose today.  For anxiety will increase sertraline increase dose as well.  Follow up 10-14 days or as needed  Notify us in one month how you are feeling regarding anxiety and concentration.

## 2016-08-23 ENCOUNTER — Telehealth: Payer: Self-pay | Admitting: Medical

## 2016-08-23 NOTE — Telephone Encounter (Signed)
Will you call [employer] prior auth dept. [id number] about her amphet/dextra cap 20 mg ER refill. They said they gave one time 30 day supply. Not sure how to get this done now that Shanda BumpsJessica not here?

## 2016-08-24 ENCOUNTER — Telehealth: Payer: Self-pay | Admitting: Medical

## 2016-08-24 NOTE — Telephone Encounter (Signed)
Glad I could help!!

## 2016-08-24 NOTE — Telephone Encounter (Signed)
Thank you for working on that prior authorization.

## 2016-08-24 NOTE — Telephone Encounter (Signed)
[employer]ed [employer] PA department and spoke to [place], Certified Rep.  Per Penni BombardKendall a 30 day supply of the medication was given, but going forward a PA will be required.  PA process was initiated verbally over the phone.  PA was approved for 12 months. Called pharmacy to make them aware.

## 2016-08-26 ENCOUNTER — Telehealth: Payer: Self-pay | Admitting: *Deleted

## 2016-08-26 NOTE — Telephone Encounter (Signed)
Received approval letter coverage for Amphetamine/Dextroamphetamine ER.   Time period (08/24/16-08/24/17).  Letter sent for scanning.//AB/CMA

## 2016-08-27 ENCOUNTER — Telehealth: Payer: Self-pay | Admitting: Internal Medicine

## 2016-08-27 MED ORDER — CLOTRIMAZOLE 10 MG MT TROC: 10.0000 mg | Freq: Four times a day (QID) | OROMUCOSAL | 0 refills | Status: DC

## 2016-08-27 MED ORDER — BUDESONIDE-FORMOTEROL FUMARATE 80-4.5 MCG/ACT IN AERO: 2.0000 | INHALATION_SPRAY | Freq: Two times a day (BID) | RESPIRATORY_TRACT | 0 refills | Status: DC

## 2016-08-27 NOTE — Telephone Encounter (Signed)
Called spoke with pt. She reports she has thrush from her symbicort. Reports she is rinsing after using inhaler. Requesting something to be called in. Please advise MW thanks

## 2016-08-27 NOTE — Telephone Encounter (Signed)
Clotrimazole troche 10 mg qid prn #16 Change symbicort to 80 2bid

## 2016-08-27 NOTE — Telephone Encounter (Signed)
Spoke with pt. She is aware of MW's recommendations. [facility]. Nothing further was needed at this time.

## 2016-09-08 ENCOUNTER — Telehealth: Payer: Self-pay | Admitting: Medical

## 2016-09-08 NOTE — Telephone Encounter (Signed)
°  Relationship to patient: Self Can be reached: [phone]   [facility] [place] - [address] [phone] (Phone) [phone] (Fax)    Reason for call: Refill amphetamine-dextroamphetamine (ADDERALL XR) 30 MG 24 hr capsule [[id number]][[id number]]  sertraline (ZOLOFT) 50 MG tablet [[id number]

## 2016-09-08 NOTE — Telephone Encounter (Signed)
Looks like I wrote her for adderall on 08-13-2016. So she is 5 days early for that med. Can only give 30 days supply per state law. So If you print both on this coming Monday. Just 30 tabs of her adderal 30 mg. No refills. And on sertraline if she feels well on 50 mg dose then can send that in same sig with 3 refills.  But on controlled med adderal to early to fill by 5 days.

## 2016-09-09 NOTE — Telephone Encounter (Signed)
Called patient,states she will call back on Monday morning to get Rx refilled.

## 2016-09-14 ENCOUNTER — Ambulatory Visit (HOSPITAL_COMMUNITY): Payer: Self-pay | Admitting: Psychiatry

## 2016-09-21 ENCOUNTER — Other Ambulatory Visit: Payer: Self-pay

## 2016-09-21 MED ORDER — AMPHETAMINE-DEXTROAMPHET ER 30 MG PO CP24: 30.0000 mg | ORAL_CAPSULE | Freq: Every day | ORAL | 0 refills | Status: DC

## 2016-09-21 NOTE — Telephone Encounter (Signed)
Error/gd °

## 2016-10-10 ENCOUNTER — Inpatient Hospital Stay (HOSPITAL_BASED_OUTPATIENT_CLINIC_OR_DEPARTMENT_OTHER)
Admission: EM | Admit: 2016-10-10 | Discharge: 2016-10-15 | DRG: 202 | Disposition: A | Discharge status: Home / Self Care | Payer: Medicaid Other | Attending: Internal Medicine | Admitting: Internal Medicine

## 2016-10-10 ENCOUNTER — Encounter (HOSPITAL_BASED_OUTPATIENT_CLINIC_OR_DEPARTMENT_OTHER): Payer: Self-pay | Admitting: Emergency Medicine

## 2016-10-10 ENCOUNTER — Emergency Department (HOSPITAL_BASED_OUTPATIENT_CLINIC_OR_DEPARTMENT_OTHER): Payer: Medicaid Other

## 2016-10-10 DIAGNOSIS — F909 Attention-deficit hyperactivity disorder, unspecified type: Secondary | ICD-10-CM

## 2016-10-10 DIAGNOSIS — Z23 Encounter for immunization: Secondary | ICD-10-CM

## 2016-10-10 DIAGNOSIS — F418 Other specified anxiety disorders: Secondary | ICD-10-CM | POA: Diagnosis not present

## 2016-10-10 DIAGNOSIS — Z6841 Body Mass Index (BMI) 40.0 and over, adult: Secondary | ICD-10-CM

## 2016-10-10 DIAGNOSIS — J9601 Acute respiratory failure with hypoxia: Secondary | ICD-10-CM

## 2016-10-10 DIAGNOSIS — Z888 Allergy status to other drugs, medicaments and biological substances status: Secondary | ICD-10-CM

## 2016-10-10 DIAGNOSIS — E876 Hypokalemia: Secondary | ICD-10-CM | POA: Diagnosis present

## 2016-10-10 DIAGNOSIS — J189 Pneumonia, unspecified organism: Secondary | ICD-10-CM | POA: Clinically undetermined

## 2016-10-10 DIAGNOSIS — J209 Acute bronchitis, unspecified: Secondary | ICD-10-CM | POA: Diagnosis present

## 2016-10-10 DIAGNOSIS — R000 Tachycardia, unspecified: Secondary | ICD-10-CM | POA: Diagnosis present

## 2016-10-10 DIAGNOSIS — J9600 Acute respiratory failure, unspecified whether with hypoxia or hypercapnia: Secondary | ICD-10-CM

## 2016-10-10 DIAGNOSIS — J45901 Unspecified asthma with (acute) exacerbation: Secondary | ICD-10-CM | POA: Diagnosis present

## 2016-10-10 DIAGNOSIS — F988 Other specified behavioral and emotional disorders with onset usually occurring in childhood and adolescence: Secondary | ICD-10-CM

## 2016-10-10 DIAGNOSIS — J3089 Other allergic rhinitis: Secondary | ICD-10-CM | POA: Diagnosis present

## 2016-10-10 DIAGNOSIS — D72829 Elevated white blood cell count, unspecified: Secondary | ICD-10-CM | POA: Diagnosis present

## 2016-10-10 DIAGNOSIS — J4541 Moderate persistent asthma with (acute) exacerbation: Principal | ICD-10-CM | POA: Diagnosis present

## 2016-10-10 DIAGNOSIS — Z886 Allergy status to analgesic agent status: Secondary | ICD-10-CM

## 2016-10-10 DIAGNOSIS — J45909 Unspecified asthma, uncomplicated: Secondary | ICD-10-CM | POA: Diagnosis not present

## 2016-10-10 DIAGNOSIS — Z881 Allergy status to other antibiotic agents status: Secondary | ICD-10-CM

## 2016-10-10 LAB — CBC WITH DIFFERENTIAL/PLATELET
BASOS ABS: 0 10*3/uL (ref 0.0–0.1)
Basophils Relative: 0 %
Eosinophils Absolute: 0.8 10*3/uL — ABNORMAL HIGH (ref 0.0–0.7)
Eosinophils Relative: 6 %
HEMATOCRIT: 38.5 % (ref 36.0–46.0)
HEMOGLOBIN: 13 g/dL (ref 12.0–15.0)
LYMPHS PCT: 10 %
Lymphs Abs: 1.4 10*3/uL (ref 0.7–4.0)
MCH: 30.4 pg (ref 26.0–34.0)
MCHC: 33.8 g/dL (ref 30.0–36.0)
MCV: 90.2 fL (ref 78.0–100.0)
MONO ABS: 1.2 10*3/uL — AB (ref 0.1–1.0)
MONOS PCT: 8 %
NEUTROS ABS: 10.7 10*3/uL — AB (ref 1.7–7.7)
NEUTROS PCT: 76 %
Platelets: 292 10*3/uL (ref 150–400)
RBC: 4.27 MIL/uL (ref 3.87–5.11)
RDW: 13.3 % (ref 11.5–15.5)
WBC: 14.2 10*3/uL — ABNORMAL HIGH (ref 4.0–10.5)

## 2016-10-10 LAB — BASIC METABOLIC PANEL
ANION GAP: 7 (ref 5–15)
BUN: 10 mg/dL (ref 6–20)
CO2: 26 mmol/L (ref 22–32)
Calcium: 8.7 mg/dL — ABNORMAL LOW (ref 8.9–10.3)
Chloride: 105 mmol/L (ref 101–111)
Creatinine, Ser: 0.72 mg/dL (ref 0.44–1.00)
GLUCOSE: 110 mg/dL — AB (ref 65–99)
POTASSIUM: 3.2 mmol/L — AB (ref 3.5–5.1)
Sodium: 138 mmol/L (ref 135–145)

## 2016-10-10 LAB — TROPONIN I: Troponin I: <0.03 ng/mL (ref <0.03)

## 2016-10-10 LAB — CBC
HEMATOCRIT: 39 % (ref 36.0–46.0)
Hemoglobin: 13.2 g/dL (ref 12.0–15.0)
MCH: 30.6 pg (ref 26.0–34.0)
MCHC: 33.8 g/dL (ref 30.0–36.0)
MCV: 90.5 fL (ref 78.0–100.0)
PLATELETS: 322 10*3/uL (ref 150–400)
RBC: 4.31 MIL/uL (ref 3.87–5.11)
RDW: 13.5 % (ref 11.5–15.5)
WBC: 12.9 10*3/uL — AB (ref 4.0–10.5)

## 2016-10-10 LAB — COMPREHENSIVE METABOLIC PANEL
ALK PHOS: 71 U/L (ref 38–126)
ALT: 29 U/L (ref 14–54)
AST: 26 U/L (ref 15–41)
Albumin: 4.1 g/dL (ref 3.5–5.0)
Anion gap: 6 (ref 5–15)
BILIRUBIN TOTAL: 0.5 mg/dL (ref 0.3–1.2)
BUN: 7 mg/dL (ref 6–20)
CALCIUM: 8.9 mg/dL (ref 8.9–10.3)
CO2: 25 mmol/L (ref 22–32)
CREATININE: 0.67 mg/dL (ref 0.44–1.00)
Chloride: 108 mmol/L (ref 101–111)
GFR calc Af Amer: >60 mL/min (ref >60)
GLUCOSE: 159 mg/dL — AB (ref 65–99)
POTASSIUM: 4.5 mmol/L (ref 3.5–5.1)
Sodium: 139 mmol/L (ref 135–145)
TOTAL PROTEIN: 8 g/dL (ref 6.5–8.1)

## 2016-10-10 LAB — INFLUENZA PANEL BY PCR (TYPE A & B)
H1N1 flu by pcr: NOT DETECTED
INFLAPCR: NEGATIVE
INFLBPCR: NEGATIVE

## 2016-10-10 LAB — MAGNESIUM: MAGNESIUM: 2.4 mg/dL (ref 1.7–2.4)

## 2016-10-10 MED ORDER — ALBUTEROL (5 MG/ML) CONTINUOUS INHALATION SOLN
15.0000 mg/h | INHALATION_SOLUTION | Freq: Once | RESPIRATORY_TRACT | Status: AC
  Administered 2016-10-10: 15 mg/h via RESPIRATORY_TRACT
  Filled 2016-10-10: qty 20

## 2016-10-10 MED ORDER — POTASSIUM CHLORIDE CRYS ER 20 MEQ PO TBCR
40.0000 meq | EXTENDED_RELEASE_TABLET | ORAL | Status: AC
  Administered 2016-10-10 (×2): 40 meq via ORAL
  Filled 2016-10-10 (×2): qty 2

## 2016-10-10 MED ORDER — ENOXAPARIN SODIUM 60 MG/0.6ML ~~LOC~~ SOLN
60.0000 mg | SUBCUTANEOUS | Status: DC
Start: 2016-10-10 — End: 2016-10-15
  Administered 2016-10-10 – 2016-10-14 (×5): 60 mg via SUBCUTANEOUS
  Filled 2016-10-10 (×6): qty 0.6

## 2016-10-10 MED ORDER — AMPHETAMINE-DEXTROAMPHET ER 10 MG PO CP24
30.0000 mg | ORAL_CAPSULE | Freq: Every day | ORAL | Status: DC
  Administered 2016-10-10 – 2016-10-15 (×6): 30 mg via ORAL
  Filled 2016-10-10 (×5): qty 3

## 2016-10-10 MED ORDER — MAGNESIUM SULFATE 2 GM/50ML IV SOLN
2.0000 g | Freq: Once | INTRAVENOUS | Status: AC
  Administered 2016-10-10: 2 g via INTRAVENOUS
  Filled 2016-10-10: qty 50

## 2016-10-10 MED ORDER — DM-GUAIFENESIN ER 30-600 MG PO TB12
1.0000 | ORAL_TABLET | Freq: Two times a day (BID) | ORAL | Status: DC
  Administered 2016-10-10 – 2016-10-11 (×3): 1 via ORAL
  Filled 2016-10-10 (×3): qty 1

## 2016-10-10 MED ORDER — PANTOPRAZOLE SODIUM 40 MG PO TBEC
40.0000 mg | DELAYED_RELEASE_TABLET | Freq: Every day | ORAL | Status: DC
  Administered 2016-10-10 – 2016-10-13 (×4): 40 mg via ORAL
  Filled 2016-10-10 (×4): qty 1

## 2016-10-10 MED ORDER — SERTRALINE HCL 50 MG PO TABS
50.0000 mg | ORAL_TABLET | Freq: Every day | ORAL | Status: DC
Start: 2016-10-10 — End: 2016-10-15
  Administered 2016-10-10 – 2016-10-15 (×6): 50 mg via ORAL
  Filled 2016-10-10 (×6): qty 1

## 2016-10-10 MED ORDER — SODIUM CHLORIDE 0.9% FLUSH
3.0000 mL | Freq: Two times a day (BID) | INTRAVENOUS | Status: DC
  Administered 2016-10-10 – 2016-10-15 (×11): 3 mL via INTRAVENOUS

## 2016-10-10 MED ORDER — ONDANSETRON HCL 4 MG/2ML IJ SOLN: 4.0000 mg | Freq: Four times a day (QID) | INTRAMUSCULAR | Status: DC | PRN

## 2016-10-10 MED ORDER — PNEUMOCOCCAL VAC POLYVALENT 25 MCG/0.5ML IJ INJ
0.5000 mL | INJECTION | INTRAMUSCULAR | Status: AC
  Administered 2016-10-11: 0.5 mL via INTRAMUSCULAR
  Filled 2016-10-10 (×2): qty 0.5

## 2016-10-10 MED ORDER — LEVALBUTEROL HCL 1.25 MG/0.5ML IN NEBU
1.2500 mg | INHALATION_SOLUTION | Freq: Once | RESPIRATORY_TRACT | Status: AC
  Administered 2016-10-10: 1.25 mg via RESPIRATORY_TRACT
  Filled 2016-10-10: qty 0.5

## 2016-10-10 MED ORDER — CLONAZEPAM 0.5 MG PO TABS: 0.5000 mg | ORAL_TABLET | Freq: Two times a day (BID) | ORAL | Status: DC | PRN

## 2016-10-10 MED ORDER — IPRATROPIUM-ALBUTEROL 0.5-2.5 (3) MG/3ML IN SOLN
3.0000 mL | Freq: Four times a day (QID) | RESPIRATORY_TRACT | Status: DC
  Administered 2016-10-10 – 2016-10-11 (×6): 3 mL via RESPIRATORY_TRACT
  Filled 2016-10-10 (×6): qty 3

## 2016-10-10 MED ORDER — SODIUM CHLORIDE 0.9% FLUSH: 3.0000 mL | INTRAVENOUS | Status: DC | PRN

## 2016-10-10 MED ORDER — INFLUENZA VAC SPLIT QUAD 0.5 ML IM SUSY
0.5000 mL | PREFILLED_SYRINGE | INTRAMUSCULAR | Status: AC
  Administered 2016-10-11: 0.5 mL via INTRAMUSCULAR
  Filled 2016-10-10: qty 0.5

## 2016-10-10 MED ORDER — ADULT MULTIVITAMIN W/MINERALS CH
1.0000 | ORAL_TABLET | Freq: Every day | ORAL | Status: DC
  Administered 2016-10-10 – 2016-10-15 (×6): 1 via ORAL
  Filled 2016-10-10 (×6): qty 1

## 2016-10-10 MED ORDER — LEVALBUTEROL HCL 1.25 MG/0.5ML IN NEBU
1.2500 mg | INHALATION_SOLUTION | RESPIRATORY_TRACT | Status: DC
Start: 2016-10-10 — End: 2016-10-10
  Filled 2016-10-10: qty 0.5

## 2016-10-10 MED ORDER — SODIUM CHLORIDE 0.9 % IV BOLUS (SEPSIS)
500.0000 mL | Freq: Once | INTRAVENOUS | Status: AC
  Administered 2016-10-10: 500 mL via INTRAVENOUS

## 2016-10-10 MED ORDER — MONTELUKAST SODIUM 10 MG PO TABS
10.0000 mg | ORAL_TABLET | Freq: Every day | ORAL | Status: DC
  Administered 2016-10-10 – 2016-10-14 (×5): 10 mg via ORAL
  Filled 2016-10-10 (×5): qty 1

## 2016-10-10 MED ORDER — ONDANSETRON HCL 4 MG PO TABS: 4.0000 mg | ORAL_TABLET | Freq: Four times a day (QID) | ORAL | Status: DC | PRN

## 2016-10-10 MED ORDER — AZITHROMYCIN 250 MG PO TABS
500.0000 mg | ORAL_TABLET | Freq: Every day | ORAL | Status: AC
  Administered 2016-10-10: 500 mg via ORAL
  Filled 2016-10-10: qty 2

## 2016-10-10 MED ORDER — SODIUM CHLORIDE 0.9 % IN NEBU
INHALATION_SOLUTION | RESPIRATORY_TRACT | Status: AC
  Administered 2016-10-10: 04:00:00
  Filled 2016-10-10: qty 3

## 2016-10-10 MED ORDER — ACETAMINOPHEN 325 MG PO TABS: 650.0000 mg | ORAL_TABLET | Freq: Four times a day (QID) | ORAL | Status: DC | PRN

## 2016-10-10 MED ORDER — FLUTICASONE PROPIONATE 50 MCG/ACT NA SUSP
2.0000 | Freq: Every day | NASAL | Status: DC
  Administered 2016-10-10 – 2016-10-15 (×6): 2 via NASAL
  Filled 2016-10-10: qty 16

## 2016-10-10 MED ORDER — MULTIVITAMIN ADULT PO TABS: ORAL_TABLET | ORAL | Status: DC

## 2016-10-10 MED ORDER — LEVALBUTEROL HCL 1.25 MG/0.5ML IN NEBU
1.2500 mg | INHALATION_SOLUTION | RESPIRATORY_TRACT | Status: DC | PRN
  Administered 2016-10-10 (×2): 1.25 mg via RESPIRATORY_TRACT
  Filled 2016-10-10 (×4): qty 0.5

## 2016-10-10 MED ORDER — ACETAMINOPHEN 650 MG RE SUPP: 650.0000 mg | Freq: Four times a day (QID) | RECTAL | Status: DC | PRN

## 2016-10-10 MED ORDER — LEVALBUTEROL HCL 0.63 MG/3ML IN NEBU: 0.6300 mg | INHALATION_SOLUTION | RESPIRATORY_TRACT | Status: DC

## 2016-10-10 MED ORDER — METHYLPREDNISOLONE SODIUM SUCC 125 MG IJ SOLR
60.0000 mg | Freq: Two times a day (BID) | INTRAMUSCULAR | Status: DC
  Administered 2016-10-10 – 2016-10-14 (×9): 60 mg via INTRAVENOUS
  Filled 2016-10-10 (×9): qty 2

## 2016-10-10 MED ORDER — FAMOTIDINE 20 MG PO TABS
20.0000 mg | ORAL_TABLET | Freq: Every day | ORAL | Status: DC
  Administered 2016-10-10 – 2016-10-14 (×5): 20 mg via ORAL
  Filled 2016-10-10 (×5): qty 1

## 2016-10-10 MED ORDER — ALBUTEROL SULFATE (2.5 MG/3ML) 0.083% IN NEBU
2.5000 mg | INHALATION_SOLUTION | Freq: Once | RESPIRATORY_TRACT | Status: AC
  Administered 2016-10-10: 2.5 mg via RESPIRATORY_TRACT

## 2016-10-10 MED ORDER — IPRATROPIUM-ALBUTEROL 0.5-2.5 (3) MG/3ML IN SOLN
3.0000 mL | Freq: Once | RESPIRATORY_TRACT | Status: AC
  Administered 2016-10-10: 3 mL via RESPIRATORY_TRACT

## 2016-10-10 MED ORDER — AZITHROMYCIN 250 MG PO TABS
250.0000 mg | ORAL_TABLET | Freq: Every day | ORAL | Status: AC
  Administered 2016-10-11 – 2016-10-14 (×4): 250 mg via ORAL
  Filled 2016-10-10 (×4): qty 1

## 2016-10-10 MED ORDER — BUDESONIDE 0.25 MG/2ML IN SUSP
0.2500 mg | Freq: Two times a day (BID) | RESPIRATORY_TRACT | Status: DC
  Administered 2016-10-10 – 2016-10-15 (×11): 0.25 mg via RESPIRATORY_TRACT
  Filled 2016-10-10 (×11): qty 2

## 2016-10-10 MED ORDER — SODIUM CHLORIDE 0.9 % IV SOLN: 250.0000 mL | INTRAVENOUS | Status: DC | PRN

## 2016-10-10 MED ORDER — METHYLPREDNISOLONE SODIUM SUCC 125 MG IJ SOLR
125.0000 mg | Freq: Once | INTRAMUSCULAR | Status: AC
Start: 2016-10-10 — End: 2016-10-10
  Administered 2016-10-10: 125 mg via INTRAVENOUS
  Filled 2016-10-10: qty 2

## 2016-10-10 NOTE — H&P (Addendum)
History and Physical    [name] ZOX:[id number] DOB: May 13, 1981 DOA: 10/10/2016    PCP: Esperanza Richters, PA-C  Patient coming from: home  Chief Complaint: shortness of breath  HPI: [name] is a 35 y.o. female with medical history significant of Asthma since childhood who presents from [facility]. She has been short of breath due to allergies to leaves during the fall. She has been using her Inhaler often. Usually does not need it. She progressed to using her Nebulizer 2 days ago when she was house sitting for someone with dogs. It did not help and therefore came to the ER. She has had a cough with green sputum. Upper mid chest hurts which is worse when she coughs or takes a deep breath. She has a sore throat. No runny nose or ear ache.  She feels dizzy at times and has had a headache. No syncope.   ED Course: pulse ox was 93% at room air at [facility],  currently 88 % room air, HR 110-120 on arrival, in 90s now K 3.2, WBC 14.2- 10.7 Neutros CXR: Diffuse interstitial and peribronchial thickening may represent bronchitis or viral pneumonia  Review of Systems:  Depression and anxiety- going through a divorce. Weight loss 269 lb>> 255 in past month or so.  Thinks she bruises easily. All other systems reviewed and apart from HPI, are negative.  Past Medical History:  Diagnosis Date  . Asthma   . History of chicken pox   . Pneumonia   . UTI (urinary tract infection)     Past Surgical History:  Procedure Laterality Date  . CESAREAN SECTION  09/27/12    Social History:   reports that she has never smoked. She has never used smokeless tobacco. She reports that she does not drink alcohol or use drugs.  Works part time a [employer], has 2 children  Allergies  Allergen Reactions  . Aspirin Hives  . Levaquin [Levofloxacin In D5w] Hives  . Pamprin [Apap-Pamabrom-Pyrilamine] Hives    Family History  Problem Relation Age of Onset  . Allergies Sister      Prior to Admission  medications   Medication Sig Start Date End Date Taking? Authorizing Provider  albuterol (PROAIR HFA) 108 (90 BASE) MCG/ACT inhaler Inhale 2 puffs into the lungs every 6 (six) hours as needed for wheezing. 03/31/15  Yes Nyoka Cowden, MD  amphetamine-dextroamphetamine (ADDERALL XR) 30 MG 24 hr capsule Take 1 capsule (30 mg total) by mouth daily. Patient taking differently: Take 30 mg by mouth every morning.  09/21/16  Yes Waldon Merl, PA-C  budesonide-formoterol (SYMBICORT) 80-4.5 MCG/ACT inhaler Inhale 2 puffs into the lungs 2 (two) times daily. 08/27/16  Yes Nyoka Cowden, MD  clonazePAM (KLONOPIN) 0.5 MG tablet Take 1 tablet (0.5 mg total) by mouth 2 (two) times daily as needed for anxiety. 06/21/16  Yes Edward Saguier, PA-C  famotidine (PEPCID) 20 MG tablet Take 1 tablet (20 mg total) by mouth at bedtime. 06/23/16  Yes Nyoka Cowden, MD  fluticasone (FLONASE) 50 MCG/ACT nasal spray Place 1 spray into both nostrils 2 (two) times daily. Patient taking differently: Place 2 sprays into both nostrils 2 (two) times daily.  06/23/16  Yes Nyoka Cowden, MD  levalbuterol Pauline Aus) 1.25 MG/3ML nebulizer solution Take 1.25 mg by nebulization every 4 (four) hours as needed for wheezing.   Yes Historical Provider, MD  Melatonin 10 MG CAPS Take 20 mg by mouth at bedtime as needed (sleep).   Yes Historical  Provider, MD  Multiple Vitamins-Minerals (MULTIVITAMIN ADULT PO) Take 1 tablet by mouth every morning.   Yes Historical Provider, MD  omeprazole (PRILOSEC) 40 MG capsule Take 1 capsule (40 mg total) by mouth daily. Patient taking differently: Take 40 mg by mouth every morning.  06/23/16  Yes Nyoka Cowden, MD  sertraline (ZOLOFT) 50 MG tablet Take 1 tablet (50 mg total) by mouth daily. Patient taking differently: Take 50 mg by mouth every morning.  08/13/16  Yes Esperanza Richters, PA-C  azithromycin (ZITHROMAX) 250 MG tablet Take 2 tablets by mouth on day 1, followed by 1 tablet by mouth daily for 4 days. Patient  not taking: Reported on 10/10/2016 08/13/16   Esperanza Richters, PA-C  benzonatate (TESSALON) 100 MG capsule Take 1 capsule (100 mg total) by mouth 3 (three) times daily as needed. Patient not taking: Reported on 10/10/2016 08/13/16   Esperanza Richters, PA-C  clotrimazole [facility]) 10 MG troche Take 1 tablet (10 mg total) by mouth 4 (four) times daily. Patient not taking: Reported on 10/10/2016 08/27/16   Nyoka Cowden, MD    Physical Exam: Vitals:   10/10/16 0500 10/10/16 0506 10/10/16 0540 10/10/16 0621  BP:  130/77  (!) 141/85  Pulse: 106 97  98  Resp: 17 25  20   Temp:   98.2 F (36.8 C) 98 F (36.7 C)  TempSrc:    Oral  SpO2: 96% 94%  93%  Weight:      Height:          Constitutional: NAD, calm, comfortable Eyes: PERTLA, lids and conjunctivae normal ENMT: Mucous membranes are moist. Posterior pharynx clear of any exudate or lesions. Normal dentition.  Neck: normal, supple, no masses, no thyromegaly Respiratory: mild scattered wheezing, not able to take a deep breath, poor air entry no crackles.  No accessory muscle use.  Cardiovascular: S1 & S2 heard, regular rate and rhythm, no murmurs / rubs / gallops. No extremity edema. 2+ pedal pulses. No carotid bruits.  Abdomen: No distension, no tenderness, no masses palpated. No hepatosplenomegaly. Bowel sounds normal.  Musculoskeletal: no clubbing / cyanosis. No joint deformity upper and lower extremities. Good ROM, no contractures. Normal muscle tone.  Skin: no rashes, lesions, ulcers. No induration Neurologic: CN 2-12 grossly intact. Sensation intact, DTR normal. Strength 5/5 in all 4 limbs.  Psychiatric: Normal judgment and insight. Alert and oriented x 3. Normal mood.     Labs on Admission: I have personally reviewed following labs and imaging studies  CBC:  Recent Labs Lab 10/10/16 0152  WBC 14.2*  NEUTROABS 10.7*  HGB 13.0  HCT 38.5  MCV 90.2  PLT 292   Basic Metabolic Panel:  Recent Labs Lab 10/10/16 0152  NA 138    K 3.2*  CL 105  CO2 26  GLUCOSE 110*  BUN 10  CREATININE 0.72  CALCIUM 8.7*   GFR: Estimated Creatinine Clearance: 129.3 mL/min (by C-G formula based on SCr of 0.72 mg/dL). Liver Function Tests: No results for input(s): AST, ALT, ALKPHOS, BILITOT, PROT, ALBUMIN in the last 168 hours. No results for input(s): LIPASE, AMYLASE in the last 168 hours. No results for input(s): AMMONIA in the last 168 hours. Coagulation Profile: No results for input(s): INR, PROTIME in the last 168 hours. Cardiac Enzymes: No results for input(s): CKTOTAL, CKMB, CKMBINDEX, TROPONINI in the last 168 hours. BNP (last 3 results) No results for input(s): PROBNP in the last 8760 hours. HbA1C: No results for input(s): HGBA1C in the last 72 hours. CBG:  No results for input(s): GLUCAP in the last 168 hours. Lipid Profile: No results for input(s): CHOL, HDL, LDLCALC, TRIG, CHOLHDL, LDLDIRECT in the last 72 hours. Thyroid Function Tests: No results for input(s): TSH, T4TOTAL, FREET4, T3FREE, THYROIDAB in the last 72 hours. Anemia Panel: No results for input(s): VITAMINB12, FOLATE, FERRITIN, TIBC, IRON, RETICCTPCT in the last 72 hours. Urine analysis:    Component Value Date/Time   COLORURINE YELLOW 11/27/2012 1540   APPEARANCEUR HAZY (A) 11/27/2012 1540   LABSPEC 1.041 (H) 11/27/2012 1540   PHURINE 6.0 11/27/2012 1540   GLUCOSEU NEGATIVE 11/27/2012 1540   HGBUR LARGE (A) 11/27/2012 1540   BILIRUBINUR NEGATIVE 11/27/2012 1540   KETONESUR NEGATIVE 11/27/2012 1540   PROTEINUR NEGATIVE 11/27/2012 1540   UROBILINOGEN 0.2 11/27/2012 1540   NITRITE NEGATIVE 11/27/2012 1540   LEUKOCYTESUR NEGATIVE 11/27/2012 1540   Sepsis Labs: @LABRCNTIP (procalcitonin:4,lacticidven:4) )No results found for this or any previous visit (from the past 240 hour(s)).   Radiological Exams on Admission: Dg Chest Portable 1 View  Result Date: 10/10/2016 CLINICAL DATA:  35 year old female with shortness of breath and asthma.  EXAM: PORTABLE CHEST 1 VIEW COMPARISON:  Chest radiograph dated 11/28/2012 FINDINGS: Single-view of the chest demonstrate diffuse interstitial and peribronchial prominence which may represent bronchitis or viral pneumonia. There is no focal consolidation, pleural effusion, or pneumothorax. The cardiac silhouette is within normal limits. There is a thoracic dextroscoliosis. No acute osseous pathology. IMPRESSION: Diffuse interstitial and peribronchial thickening may represent bronchitis or viral pneumonia. Clinical correlation is recommended. Electronically Signed   By: Elgie CollardArash  Radparvar M.D.   On: 10/10/2016 02:31    EKG: Independently reviewed. Sinus tachy at 110 bpm  Assessment/Plan Principal Problem:   Asthma exacerbation/   Extrinsic asthma/  Respiratory failure, acute with hypoxia  Sepsis- tachycardia, leukocytosis  Acute bronchitis - cont O2 as needed - check Resp viral panel, influenza - Solumedrol, Duonebs, PRN Xopenex nebs (uses it at home), Pulmicort - Z pak, Mucinex, Flutter valve - add Singulair - Dr Sherene SiresWert keeps her on a PPI and Pepcid at night which seems to help her symptoms  Active Problems:  Allergic rhinitis - Flonase    Hypokalemia - replace, check Mg    Depression with anxiety - Zoloft- rarely uses her Clonazepam but will order PRN in case she needs it  ADD - Adderal    Severe obesity (BMI >= 40) (HCC) - Body mass index is 41.16 kg/m.       DVT prophylaxis: Lovenox Code Status: full code  Family Communication:   Disposition Plan: follow on med/surg  Consults called: none  Admission status: Observation    Mena Lienau MD [facility] Pager: [email or web address] Password [email or web address] 7PM-7AM, please contact night-coverage   10/10/2016, 8:05 AM

## 2016-10-10 NOTE — Progress Notes (Signed)
Patient placed on 3 liter nasal cannula to maintain a SPO2 > or = 94%.

## 2016-10-10 NOTE — ED Notes (Signed)
[facility] [place] [facility]

## 2016-10-10 NOTE — ED Notes (Signed)
[facility] Sats Pt with sob with exertion and 02 sats were 88-89% on RA. O2 placed at 3l via n/c with o2 sats increasing to 93%. Pt still with insp and expiratory wheezing noted. resp less labored when pt returned to bed and 02 placed.

## 2016-10-10 NOTE — ED Triage Notes (Signed)
Pt in c/o SOB due to asthma x 2 days, RRT in to assess. Sat 91%, pt labored and tachycardic.

## 2016-10-10 NOTE — ED Notes (Signed)
Pt states she is breathing easier at present. [facility]

## 2016-10-10 NOTE — ED Notes (Signed)
Pt states she has a hx of asthma and is allergic to leaves. Increased SHOB today. Brett CanalesSteve, RRT at bedside administering nebs per protocol. Pt hyperventilating. Instructed to slow breathing.

## 2016-10-10 NOTE — ED Notes (Signed)
[facility] here to transport pt to [facility]. Stable at transfer.  

## 2016-10-10 NOTE — ED Notes (Signed)
Report given to misty with [facility]

## 2016-10-10 NOTE — ED Provider Notes (Signed)
Emergency Department Provider Note   I have reviewed the triage vital signs and the nursing notes.   HISTORY  Chief Complaint Shortness of Breath and Asthma   HPI [name] is a 35 y.o. female with PMH of asthma and prior PNA presents emergency department for evaluation of difficulty breathing. Her symptoms are worsening over the past 2 days. She reports using albuterol inhaler at home with little to no relief. She denies any associated fever or productive cough. She reports last time she had similar symptoms she was diagnosed with pneumonia. Then admitted to the hospital before for asthma exacerbation. No sick contacts. No associated chest pain or abdominal discomfort. No nausea or vomiting. No diarrhea. She reports being compliant with medication. Symptoms made worse with walking or speaking for prolonged period of time.    Past Medical History:  Diagnosis Date  . Asthma   . History of chicken pox   . Pneumonia   . UTI (urinary tract infection)     Patient Active Problem List   Diagnosis Date Noted  . Asthma exacerbation 10/10/2016  . Depression with anxiety 08/13/2014  . Severe obesity (BMI >= 40) (HCC) 08/13/2014  . Leukocytosis 11/27/2012  . Dehydration 11/27/2012  . Sinus tachycardia 11/27/2012  . Hypokalemia 11/26/2012  . Extrinsic asthma 08/17/2011    Past Surgical History:  Procedure Laterality Date  . CESAREAN SECTION  09/27/12      Allergies Aspirin; Levaquin [levofloxacin in d5w]; and Pamprin [apap-pamabrom-pyrilamine]  Family History  Problem Relation Age of Onset  . Allergies Sister     Social History Social History  Substance Use Topics  . Smoking status: Never Smoker  . Smokeless tobacco: Never Used  . Alcohol use No    Review of Systems  Constitutional: No fever/chills Eyes: No visual changes. ENT: No sore throat. Cardiovascular: Denies chest pain. Respiratory: Positive shortness of breath and cough.  Gastrointestinal: No  abdominal pain.  No nausea, no vomiting.  No diarrhea.  No constipation. Genitourinary: Negative for dysuria. Musculoskeletal: Negative for back pain. Skin: Negative for rash. Neurological: Negative for headaches, focal weakness or numbness.  10-point ROS otherwise negative.  ____________________________________________   PHYSICAL EXAM:  VITAL SIGNS: ED Triage Vitals  Enc Vitals Group     BP 10/10/16 0101 117/84     Pulse Rate 10/10/16 0101 117     Resp 10/10/16 0101 (!) 35     Temp 10/10/16 0101 97.9 F (36.6 C)     SpO2 10/10/16 0101 92 %     Weight 10/10/16 0102 255 lb (115.7 kg)     Height 10/10/16 0102 5\' 6"  (1.676 m)     Pain Score 10/10/16 0100 6   Constitutional: Alert and oriented. Notable tachypnea on initial presentation.  Eyes: Conjunctivae are normal.  Head: Atraumatic. Nose: Mild congestion/rhinnorhea. Mouth/Throat: Mucous membranes are dry.  Oropharynx non-erythematous. Neck: No stridor. Cardiovascular: Tachycardia. Good peripheral circulation. Grossly normal heart sounds.   Respiratory: Increased respiratory effort with tachypnea and expiratory wheezing. Diffuse poor air movement. No rales or rhonchi.  Gastrointestinal: Soft and nontender. No distention.  Musculoskeletal: No lower extremity tenderness nor edema. No gross deformities of extremities. Neurologic:  Normal speech and language. No gross focal neurologic deficits are appreciated.  Skin:  Skin is warm, dry and intact. No rash noted.  ____________________________________________   LABS (all labs ordered are listed, but only abnormal results are displayed)  Labs Reviewed  BASIC METABOLIC PANEL - Abnormal; Notable for the following:  Result Value   Potassium 3.2 (*)    Glucose, Bld 110 (*)    Calcium 8.7 (*)    All other components within normal limits  CBC WITH DIFFERENTIAL/PLATELET - Abnormal; Notable for the following:    WBC 14.2 (*)    Neutro Abs 10.7 (*)    Monocytes Absolute 1.2  (*)    Eosinophils Absolute 0.8 (*)    All other components within normal limits  COMPREHENSIVE METABOLIC PANEL  MAGNESIUM  TROPONIN I  BLOOD GAS, ARTERIAL   ____________________________________________  EKG   EKG Interpretation  Date/Time:  Sunday October 10 2016 02:07:41 EDT Ventricular Rate:  110 PR Interval:    QRS Duration: 79 QT Interval:  331 QTC Calculation: 448 R Axis:   41 Text Interpretation:  Sinus tachycardia Baseline wander in lead(s) V2 No STEMI. No prior for comparison.  Confirmed by LONG MD, JOSHUA [phone]) on 10/10/2016 3:46:02 AM       ____________________________________________  RADIOLOGY  Dg Chest Portable 1 View  Result Date: 10/10/2016 CLINICAL DATA:  35 year old female with shortness of breath and asthma. EXAM: PORTABLE CHEST 1 VIEW COMPARISON:  Chest radiograph dated 11/28/2012 FINDINGS: Single-view of the chest demonstrate diffuse interstitial and peribronchial prominence which may represent bronchitis or viral pneumonia. There is no focal consolidation, pleural effusion, or pneumothorax. The cardiac silhouette is within normal limits. There is a thoracic dextroscoliosis. No acute osseous pathology. IMPRESSION: Diffuse interstitial and peribronchial thickening may represent bronchitis or viral pneumonia. Clinical correlation is recommended. Electronically Signed   By: Elgie Collard M.D.   On: 10/10/2016 02:31    ____________________________________________   PROCEDURES  Procedure(s) performed:   Procedures  CRITICAL CARE Performed by: Maia Plan Total critical care time: 35 minutes Critical care time was exclusive of separately billable procedures and treating other patients. Critical care was necessary to treat or prevent imminent or life-threatening deterioration. Critical care was time spent personally by me on the following activities: development of treatment plan with patient and/or surrogate as well as nursing, discussions with  consultants, evaluation of patient's response to treatment, examination of patient, obtaining history from patient or surrogate, ordering and performing treatments and interventions, ordering and review of laboratory studies, ordering and review of radiographic studies, pulse oximetry and re-evaluation of patient's condition.  Alona Bene, MD Emergency Medicine  ____________________________________________   INITIAL IMPRESSION / ASSESSMENT AND PLAN / ED COURSE  Pertinent labs & imaging results that were available during my care of the patient were reviewed by me and considered in my medical decision making (see chart for details).  Patient resents emergency department for evaluation of worsening dyspnea over the past 2 days. She is a history of asthma with expiratory wheezing on exam. She has notable tachypnea and poor air movement. Plan to initiate a continuous albuterol along with IV steroids and likely magnesium. Low suspicion for alternative cause of her dyspnea such as PE or ACS given her history and supporting exam. Will reassess frequently.   03:17 AM Mild improvement after CAT treatment. Will discontinue and see if patient needs additional CAT or can be spaced to Q2H nebs. Steroids and IVF given. Will add magnesium. No obvious lobar PNA on CXR. Possible underlying viral process. Will likely require admission.   Discussed patient's case with hospitalist, Dr. Montez Morita.  Recommend admission to obs, tele bed.  I will place holding orders per their request. Patient and family (if present) updated with plan. Care transferred to hospitalist service.  I reviewed all nursing notes,  vitals, pertinent old records, EKGs, labs, imaging (as available).  ____________________________________________  FINAL CLINICAL IMPRESSION(S) / ED DIAGNOSES  Final diagnoses:  Moderate asthma with exacerbation, unspecified whether persistent     MEDICATIONS GIVEN DURING THIS VISIT:  Medications    levalbuterol (XOPENEX) nebulizer solution 1.25 mg (not administered)  Influenza vac split quadrivalent PF (FLUARIX) injection 0.5 mL (not administered)  pneumococcal 23 valent vaccine (PNU-IMMUNE) injection 0.5 mL (not administered)  ipratropium-albuterol (DUONEB) 0.5-2.5 (3) MG/3ML nebulizer solution 3 mL (3 mLs Nebulization Given 10/10/16 0111)  albuterol (PROVENTIL) (2.5 MG/3ML) 0.083% nebulizer solution 2.5 mg (2.5 mg Nebulization Given 10/10/16 0111)  levalbuterol (XOPENEX) nebulizer solution 1.25 mg (1.25 mg Nebulization Given 10/10/16 0403)  methylPREDNISolone sodium succinate (SOLU-MEDROL) 125 mg/2 mL injection 125 mg (125 mg Intravenous Given 10/10/16 0209)  sodium chloride 0.9 % bolus 500 mL (0 mLs Intravenous Stopped 10/10/16 0254)  albuterol (PROVENTIL,VENTOLIN) solution continuous neb (15 mg/hr Nebulization Given 10/10/16 0216)  magnesium sulfate IVPB 2 g 50 mL (0 g Intravenous Stopped 10/10/16 0507)  sodium chloride 0.9 % nebulizer solution (  Given 10/10/16 0404)     NEW OUTPATIENT MEDICATIONS STARTED DURING THIS VISIT:  None   Note:  This document was prepared using Dragon voice recognition software and may include unintentional dictation errors.  Alona BeneJoshua Long, MD Emergency Medicine   Maia PlanJoshua G Long, MD 10/10/16 0730

## 2016-10-10 NOTE — ED Notes (Signed)
Brett CanalesSteve, RRT at bedside to administer treatment.

## 2016-10-11 DIAGNOSIS — Z6841 Body Mass Index (BMI) 40.0 and over, adult: Secondary | ICD-10-CM | POA: Diagnosis not present

## 2016-10-11 DIAGNOSIS — J4531 Mild persistent asthma with (acute) exacerbation: Secondary | ICD-10-CM | POA: Diagnosis not present

## 2016-10-11 DIAGNOSIS — J4541 Moderate persistent asthma with (acute) exacerbation: Secondary | ICD-10-CM | POA: Diagnosis not present

## 2016-10-11 DIAGNOSIS — J189 Pneumonia, unspecified organism: Secondary | ICD-10-CM | POA: Diagnosis not present

## 2016-10-11 DIAGNOSIS — Z886 Allergy status to analgesic agent status: Secondary | ICD-10-CM | POA: Diagnosis not present

## 2016-10-11 DIAGNOSIS — Z888 Allergy status to other drugs, medicaments and biological substances status: Secondary | ICD-10-CM | POA: Diagnosis not present

## 2016-10-11 DIAGNOSIS — J3089 Other allergic rhinitis: Secondary | ICD-10-CM | POA: Diagnosis present

## 2016-10-11 DIAGNOSIS — R000 Tachycardia, unspecified: Secondary | ICD-10-CM | POA: Diagnosis not present

## 2016-10-11 DIAGNOSIS — F418 Other specified anxiety disorders: Secondary | ICD-10-CM | POA: Diagnosis present

## 2016-10-11 DIAGNOSIS — J45901 Unspecified asthma with (acute) exacerbation: Secondary | ICD-10-CM | POA: Diagnosis present

## 2016-10-11 DIAGNOSIS — F909 Attention-deficit hyperactivity disorder, unspecified type: Secondary | ICD-10-CM | POA: Diagnosis not present

## 2016-10-11 DIAGNOSIS — E876 Hypokalemia: Secondary | ICD-10-CM | POA: Diagnosis present

## 2016-10-11 DIAGNOSIS — J209 Acute bronchitis, unspecified: Secondary | ICD-10-CM | POA: Diagnosis present

## 2016-10-11 DIAGNOSIS — Z23 Encounter for immunization: Secondary | ICD-10-CM | POA: Diagnosis not present

## 2016-10-11 DIAGNOSIS — Z881 Allergy status to other antibiotic agents status: Secondary | ICD-10-CM | POA: Diagnosis not present

## 2016-10-11 LAB — RESPIRATORY PANEL BY PCR
Adenovirus: NOT DETECTED
BORDETELLA PERTUSSIS-RVPCR: NOT DETECTED
CHLAMYDOPHILA PNEUMONIAE-RVPPCR: NOT DETECTED
CORONAVIRUS HKU1-RVPPCR: NOT DETECTED
Coronavirus 229E: NOT DETECTED
Coronavirus NL63: NOT DETECTED
Coronavirus OC43: NOT DETECTED
INFLUENZA A-RVPPCR: NOT DETECTED
Influenza B: NOT DETECTED
Metapneumovirus: NOT DETECTED
Mycoplasma pneumoniae: NOT DETECTED
PARAINFLUENZA VIRUS 3-RVPPCR: NOT DETECTED
Parainfluenza Virus 1: NOT DETECTED
Parainfluenza Virus 2: NOT DETECTED
Parainfluenza Virus 4: NOT DETECTED
RHINOVIRUS / ENTEROVIRUS - RVPPCR: NOT DETECTED
Respiratory Syncytial Virus: NOT DETECTED

## 2016-10-11 MED ORDER — LEVALBUTEROL HCL 1.25 MG/0.5ML IN NEBU: 1.2500 mg | INHALATION_SOLUTION | Freq: Four times a day (QID) | RESPIRATORY_TRACT | Status: DC

## 2016-10-11 MED ORDER — LEVALBUTEROL HCL 1.25 MG/0.5ML IN NEBU
1.2500 mg | INHALATION_SOLUTION | Freq: Four times a day (QID) | RESPIRATORY_TRACT | Status: DC
  Administered 2016-10-11 – 2016-10-15 (×14): 1.25 mg via RESPIRATORY_TRACT
  Filled 2016-10-11 (×17): qty 0.5

## 2016-10-11 MED ORDER — MAGNESIUM SULFATE 2 GM/50ML IV SOLN
2.0000 g | Freq: Once | INTRAVENOUS | Status: AC
  Administered 2016-10-11: 2 g via INTRAVENOUS
  Filled 2016-10-11: qty 50

## 2016-10-11 MED ORDER — CLOTRIMAZOLE 10 MG MT TROC
10.0000 mg | Freq: Every day | OROMUCOSAL | Status: DC
  Administered 2016-10-11 – 2016-10-15 (×19): 10 mg via ORAL
  Filled 2016-10-11 (×21): qty 1

## 2016-10-11 MED ORDER — IPRATROPIUM BROMIDE 0.02 % IN SOLN
0.5000 mg | Freq: Four times a day (QID) | RESPIRATORY_TRACT | Status: DC
  Administered 2016-10-11 – 2016-10-15 (×16): 0.5 mg via RESPIRATORY_TRACT
  Filled 2016-10-11 (×17): qty 2.5

## 2016-10-11 MED ORDER — LEVALBUTEROL HCL 0.63 MG/3ML IN NEBU
0.6300 mg | INHALATION_SOLUTION | RESPIRATORY_TRACT | Status: DC | PRN
  Administered 2016-10-11 – 2016-10-14 (×5): 0.63 mg via RESPIRATORY_TRACT
  Filled 2016-10-11 (×4): qty 3

## 2016-10-11 MED ORDER — LEVALBUTEROL HCL 1.25 MG/0.5ML IN NEBU
1.2500 mg | INHALATION_SOLUTION | Freq: Four times a day (QID) | RESPIRATORY_TRACT | Status: DC
  Filled 2016-10-11: qty 0.5

## 2016-10-11 MED ORDER — GUAIFENESIN-DM 100-10 MG/5ML PO SYRP
10.0000 mL | ORAL_SOLUTION | ORAL | Status: DC | PRN
  Administered 2016-10-11 – 2016-10-15 (×12): 10 mL via ORAL
  Filled 2016-10-11 (×14): qty 10

## 2016-10-11 NOTE — Progress Notes (Signed)
PROGRESS NOTE  [name]  ZOX:[id number] DOB: 03-31-1981 DOA: 10/10/2016 PCP: Esperanza Richters, PA-C  Brief Narrative:   [name] is a 35 y.o. female with history of Asthma since childhood who presented with progressively worsening shortness of breath and wheeze after taking care of her sister's dogs.  She initially used her albuterol inhaler but did not have relief and so she used her nebulizer several times before coming to [facility] due to dyspnea.  CXR with diffuse interstitial and peribronchial thickening.  Still wheezing and Elliana Bal of breath after 1 day of treatment with steroids and duonebs.    Assessment & Plan:   Principal Problem:   Asthma exacerbation Active Problems:   Extrinsic asthma   Hypokalemia   Leukocytosis   Sinus tachycardia   Depression with anxiety   Severe obesity (BMI >= 40) (HCC)   Respiratory failure, acute (HCC)   ADD (attention deficit disorder)   Acute asthma exacerbation wearing oxygen for comfort due to increased WOB - Resp viral panel, influenza negative - Continue solumedrol, Duonebs, PRN Xopenex nebs (uses it at home), Pulmicort - Z pak due to "possible pneumonia" seen on CXR - Mucinex, Flutter valve - started on Singulair but previously had not provided much benefit and had been stopped - Increase PPI to BID  Allergic rhinitis, increased symptoms, continue Flonase    Hypokalemia due to albuterol, resolved with potassium supplementation    Depression with anxiety, stable, continue Zoloft and prn Clonazepam   ADD, stable, continue Adderal    Severe obesity (BMI >= 40) (HCC) - Body mass index is 41.16 kg/m  DVT prophylaxis: Lovenox Code Status: full code  Family Communication:   Disposition Plan:  home in 1-2 days depending on progression    Consultants:   None  Procedures:  none  Antimicrobials:   Azithromycin 10/22    Subjective:  Still feeling [name] of breath, particularly after she just went to the bathroom  and came back.  States her breathing has not been as well controlled since she had her second child.  She states Dr. Sherene Sires is going to "scold" her because she did not follow his protocol about coming to the ER and she also knowingly took care of her sister's dogs as a favor even though she knows this is a trigger.    Objective: Vitals:   10/10/16 2243 10/11/16 0633 10/11/16 0802 10/11/16 1430  BP: (!) 114/46 (!) 107/54  121/63  Pulse:  85  (!) 108  Resp:  18  18  Temp:  98 F (36.7 C)  97.6 F (36.4 C)  TempSrc:  Oral  Oral  SpO2:  98% 90% 95%  Weight:      Height:        Intake/Output Summary (Last 24 hours) at 10/11/16 1517 Last data filed at 10/11/16 0825  Gross per 24 hour  Intake              760 ml  Output                0 ml  Net              760 ml   Filed Weights   10/10/16 0102  Weight: 115.7 kg (255 lb)    Examination:  General exam:  Adult female, SCM retractions after coming back from bathroom and somewhat breathless when talking but able to speak in full sentences  HEENT:  NCAT, MMM Respiratory system:  Diminished bilateral breath sounds, high  pitched expiratory wheeze, no focal rales or rhonchi.  Frequent wheezy cough Cardiovascular system: tachycardic, regular rhythm, normal S1/S2. No murmurs, rubs, gallops or clicks.  Warm extremities Gastrointestinal system: Normal active bowel sounds, soft, nondistended, nontender. MSK:  Normal tone and bulk, no lower extremity edema Neuro:  Grossly intact    Data Reviewed: I have personally reviewed following labs and imaging studies  CBC:  Recent Labs Lab 10/10/16 0152 10/10/16 0839  WBC 14.2* 12.9*  NEUTROABS 10.7*  --   HGB 13.0 13.2  HCT 38.5 39.0  MCV 90.2 90.5  PLT 292 322   Basic Metabolic Panel:  Recent Labs Lab 10/10/16 0152 10/10/16 0839  NA 138 139  K 3.2* 4.5  CL 105 108  CO2 26 25  GLUCOSE 110* 159*  BUN 10 7  CREATININE 0.72 0.67  CALCIUM 8.7* 8.9  MG  --  2.4   GFR: Estimated  Creatinine Clearance: 129.3 mL/min (by C-G formula based on SCr of 0.67 mg/dL). Liver Function Tests:  Recent Labs Lab 10/10/16 0839  AST 26  ALT 29  ALKPHOS 71  BILITOT 0.5  PROT 8.0  ALBUMIN 4.1   No results for input(s): LIPASE, AMYLASE in the last 168 hours. No results for input(s): AMMONIA in the last 168 hours. Coagulation Profile: No results for input(s): INR, PROTIME in the last 168 hours. Cardiac Enzymes:  Recent Labs Lab 10/10/16 0839  TROPONINI <0.03   BNP (last 3 results) No results for input(s): PROBNP in the last 8760 hours. HbA1C: No results for input(s): HGBA1C in the last 72 hours. CBG: No results for input(s): GLUCAP in the last 168 hours. Lipid Profile: No results for input(s): CHOL, HDL, LDLCALC, TRIG, CHOLHDL, LDLDIRECT in the last 72 hours. Thyroid Function Tests: No results for input(s): TSH, T4TOTAL, FREET4, T3FREE, THYROIDAB in the last 72 hours. Anemia Panel: No results for input(s): VITAMINB12, FOLATE, FERRITIN, TIBC, IRON, RETICCTPCT in the last 72 hours. Urine analysis:    Component Value Date/Time   COLORURINE YELLOW 11/27/2012 1540   APPEARANCEUR HAZY (A) 11/27/2012 1540   LABSPEC 1.041 (H) 11/27/2012 1540   PHURINE 6.0 11/27/2012 1540   GLUCOSEU NEGATIVE 11/27/2012 1540   HGBUR LARGE (A) 11/27/2012 1540   BILIRUBINUR NEGATIVE 11/27/2012 1540   KETONESUR NEGATIVE 11/27/2012 1540   PROTEINUR NEGATIVE 11/27/2012 1540   UROBILINOGEN 0.2 11/27/2012 1540   NITRITE NEGATIVE 11/27/2012 1540   LEUKOCYTESUR NEGATIVE 11/27/2012 1540   Sepsis Labs: @LABRCNTIP (procalcitonin:4,lacticidven:4)  ) Recent Results (from the past 240 hour(s))  Respiratory Panel by PCR     Status: None   Collection Time: 10/10/16  7:55 AM  Result Value Ref Range Status   Adenovirus NOT DETECTED NOT DETECTED Final   Coronavirus 229E NOT DETECTED NOT DETECTED Final   Coronavirus HKU1 NOT DETECTED NOT DETECTED Final   Coronavirus NL63 NOT DETECTED NOT DETECTED  Final   Coronavirus OC43 NOT DETECTED NOT DETECTED Final   Metapneumovirus NOT DETECTED NOT DETECTED Final   Rhinovirus / Enterovirus NOT DETECTED NOT DETECTED Final   Influenza A NOT DETECTED NOT DETECTED Final   Influenza B NOT DETECTED NOT DETECTED Final   Parainfluenza Virus 1 NOT DETECTED NOT DETECTED Final   Parainfluenza Virus 2 NOT DETECTED NOT DETECTED Final   Parainfluenza Virus 3 NOT DETECTED NOT DETECTED Final   Parainfluenza Virus 4 NOT DETECTED NOT DETECTED Final   Respiratory Syncytial Virus NOT DETECTED NOT DETECTED Final   Bordetella pertussis NOT DETECTED NOT DETECTED Final   Chlamydophila pneumoniae NOT  DETECTED NOT DETECTED Final   Mycoplasma pneumoniae NOT DETECTED NOT DETECTED Final    Comment[facility]      Radiology Studies: Dg Chest Portable 1 View  Result Date: 10/10/2016 CLINICAL DATA:  35 year old female with shortness of breath and asthma. EXAM: PORTABLE CHEST 1 VIEW COMPARISON:  Chest radiograph dated 11/28/2012 FINDINGS: Single-view of the chest demonstrate diffuse interstitial and peribronchial prominence which may represent bronchitis or viral pneumonia. There is no focal consolidation, pleural effusion, or pneumothorax. The cardiac silhouette is within normal limits. There is a thoracic dextroscoliosis. No acute osseous pathology. IMPRESSION: Diffuse interstitial and peribronchial thickening may represent bronchitis or viral pneumonia. Clinical correlation is recommended. Electronically Signed   By: Elgie CollardArash  Radparvar M.D.   On: 10/10/2016 02:31     Scheduled Meds: . amphetamine-dextroamphetamine  30 mg Oral QAC breakfast  . azithromycin  250 mg Oral Daily  . budesonide (PULMICORT) nebulizer solution  0.25 mg Nebulization BID  . enoxaparin (LOVENOX) injection  60 mg Subcutaneous Q24H  . famotidine  20 mg Oral QHS  . fluticasone  2 spray Each Nare Daily  . ipratropium  0.5 mg Nebulization Q6H  . levalbuterol  1.25 mg  Nebulization Q6H  . magnesium sulfate 1 - 4 g bolus IVPB  2 g Intravenous Once  . methylPREDNISolone (SOLU-MEDROL) injection  60 mg Intravenous Q12H  . montelukast  10 mg Oral QHS  . multivitamin with minerals  1 tablet Oral Daily  . pantoprazole  40 mg Oral Daily  . sertraline  50 mg Oral Daily  . sodium chloride flush  3 mL Intravenous Q12H   Continuous Infusions:    LOS: 0 days    Time spent: 30 min    Renae FickleSHORT, Lucas Exline, MD [facility] Pager [phone]  If 7PM-7AM, please contact night-coverage [email or web address] Password [email or web address] 10/11/2016, 3:17 PM

## 2016-10-12 NOTE — Progress Notes (Signed)
PROGRESS NOTE  ADY HEIMANN  ZOX:[id number] DOB: August 16, 1981 DOA: 10/10/2016 PCP: Esperanza Richters, PA-C  Brief Narrative:   ROXANNE PANEK is a 35 y.o. female with history of Asthma since childhood who presented with progressively worsening shortness of breath and wheeze after taking care of her sister's dogs.  She initially used her albuterol inhaler but did not have relief and so she used her nebulizer several times before coming to [facility] due to dyspnea.  CXR with diffuse interstitial and peribronchial thickening.  Still wheezing and Shreyas Piatkowski of breath.  Assessment & Plan:   Principal Problem:   Asthma exacerbation Active Problems:   Extrinsic asthma   Hypokalemia   Leukocytosis   Sinus tachycardia   Depression with anxiety   Severe obesity (BMI >= 40) (HCC)   Respiratory failure, acute (HCC)   ADD (attention deficit disorder)   Acute asthma exacerbation wearing oxygen for comfort due to increased WOB, still dyspneic with going to bathroom. - Resp viral panel, influenza negative - Continue solumedrol, Duonebs, PRN Xopenex nebs (uses it at home), Pulmicort - Z pak due to "possible pneumonia" seen on CXR - Mucinex, Flutter valve - started on Singulair but previously had not provided much benefit and had been stopped - Increase PPI to BID  Allergic rhinitis, stable, continue Flonase    Hypokalemia due to albuterol, resolved with potassium supplementation    Depression with anxiety, stable, continue Zoloft and prn Clonazepam   ADD, stable, continue Adderal  Severe obesity (BMI >= 40) (HCC) - Body mass index is 41.16 kg/m  DVT prophylaxis: Lovenox Code Status: full code  Family Communication:  patient, her mother, and her son Disposition Plan:  home in 1-2 days depending on progression    Consultants:   None  Procedures:  none  Antimicrobials:   Azithromycin 10/22    Subjective:  Still feeling Merryl Buckels of breath, particularly after bathroom trips and fits  of coughing.  Had a difficult night with severe episode of shortness of breath requiring extra breathing treatment.  Does not feel better today.    Objective: Vitals:   10/12/16 0610 10/12/16 1007 10/12/16 1424 10/12/16 1457  BP:  120/65  120/74  Pulse:  89  (!) 112  Resp:      Temp:    97.8 F (36.6 C)  TempSrc:    Oral  SpO2: 95% 93% 90% 93%  Weight:      Height:        Intake/Output Summary (Last 24 hours) at 10/12/16 1625 Last data filed at 10/12/16 0700  Gross per 24 hour  Intake              480 ml  Output                0 ml  Net              480 ml   Filed Weights   10/10/16 0102  Weight: 115.7 kg (255 lb)    Examination:  General exam:  Adult female, talks in full sentences but gasps at end before being able to continue  HEENT:  NCAT, MMM Respiratory system:  Diminished bilateral breath sounds but improved compared to yesterday, moderate pitched expiratory wheeze, no focal rales or rhonchi.  Frequent wheezy cough Cardiovascular system: RRR, normal S1/S2. No murmurs, rubs, gallops or clicks.  Warm extremities Gastrointestinal system: Normal active bowel sounds, soft, nondistended, nontender. MSK:  Normal tone and bulk, no lower extremity edema Neuro:  Grossly intact  Data Reviewed: I have personally reviewed following labs and imaging studies  CBC:  Recent Labs Lab 10/10/16 0152 10/10/16 0839  WBC 14.2* 12.9*  NEUTROABS 10.7*  --   HGB 13.0 13.2  HCT 38.5 39.0  MCV 90.2 90.5  PLT 292 322   Basic Metabolic Panel:  Recent Labs Lab 10/10/16 0152 10/10/16 0839  NA 138 139  K 3.2* 4.5  CL 105 108  CO2 26 25  GLUCOSE 110* 159*  BUN 10 7  CREATININE 0.72 0.67  CALCIUM 8.7* 8.9  MG  --  2.4   GFR: Estimated Creatinine Clearance: 129.3 mL/min (by C-G formula based on SCr of 0.67 mg/dL). Liver Function Tests:  Recent Labs Lab 10/10/16 0839  AST 26  ALT 29  ALKPHOS 71  BILITOT 0.5  PROT 8.0  ALBUMIN 4.1   No results for input(s):  LIPASE, AMYLASE in the last 168 hours. No results for input(s): AMMONIA in the last 168 hours. Coagulation Profile: No results for input(s): INR, PROTIME in the last 168 hours. Cardiac Enzymes:  Recent Labs Lab 10/10/16 0839  TROPONINI <0.03   BNP (last 3 results) No results for input(s): PROBNP in the last 8760 hours. HbA1C: No results for input(s): HGBA1C in the last 72 hours. CBG: No results for input(s): GLUCAP in the last 168 hours. Lipid Profile: No results for input(s): CHOL, HDL, LDLCALC, TRIG, CHOLHDL, LDLDIRECT in the last 72 hours. Thyroid Function Tests: No results for input(s): TSH, T4TOTAL, FREET4, T3FREE, THYROIDAB in the last 72 hours. Anemia Panel: No results for input(s): VITAMINB12, FOLATE, FERRITIN, TIBC, IRON, RETICCTPCT in the last 72 hours. Urine analysis:    Component Value Date/Time   COLORURINE YELLOW 11/27/2012 1540   APPEARANCEUR HAZY (A) 11/27/2012 1540   LABSPEC 1.041 (H) 11/27/2012 1540   PHURINE 6.0 11/27/2012 1540   GLUCOSEU NEGATIVE 11/27/2012 1540   HGBUR LARGE (A) 11/27/2012 1540   BILIRUBINUR NEGATIVE 11/27/2012 1540   KETONESUR NEGATIVE 11/27/2012 1540   PROTEINUR NEGATIVE 11/27/2012 1540   UROBILINOGEN 0.2 11/27/2012 1540   NITRITE NEGATIVE 11/27/2012 1540   LEUKOCYTESUR NEGATIVE 11/27/2012 1540   Sepsis Labs: @LABRCNTIP (procalcitonin:4,lacticidven:4)  ) Recent Results (from the past 240 hour(s))  Respiratory Panel by PCR     Status: None   Collection Time: 10/10/16  7:55 AM  Result Value Ref Range Status   Adenovirus NOT DETECTED NOT DETECTED Final   Coronavirus 229E NOT DETECTED NOT DETECTED Final   Coronavirus HKU1 NOT DETECTED NOT DETECTED Final   Coronavirus NL63 NOT DETECTED NOT DETECTED Final   Coronavirus OC43 NOT DETECTED NOT DETECTED Final   Metapneumovirus NOT DETECTED NOT DETECTED Final   Rhinovirus / Enterovirus NOT DETECTED NOT DETECTED Final   Influenza A NOT DETECTED NOT DETECTED Final   Influenza B NOT  DETECTED NOT DETECTED Final   Parainfluenza Virus 1 NOT DETECTED NOT DETECTED Final   Parainfluenza Virus 2 NOT DETECTED NOT DETECTED Final   Parainfluenza Virus 3 NOT DETECTED NOT DETECTED Final   Parainfluenza Virus 4 NOT DETECTED NOT DETECTED Final   Respiratory Syncytial Virus NOT DETECTED NOT DETECTED Final   Bordetella pertussis NOT DETECTED NOT DETECTED Final   Chlamydophila pneumoniae NOT DETECTED NOT DETECTED Final   Mycoplasma pneumoniae NOT DETECTED NOT DETECTED Final    Comment: Performed at [facility]      Radiology Studies: No results found.   Scheduled Meds: . amphetamine-dextroamphetamine  30 mg Oral QAC breakfast  . azithromycin  250 mg Oral Daily  .  budesonide (PULMICORT) nebulizer solution  0.25 mg Nebulization BID  . clotrimazole  10 mg Oral 5 X Daily  . enoxaparin (LOVENOX) injection  60 mg Subcutaneous Q24H  . famotidine  20 mg Oral QHS  . fluticasone  2 spray Each Nare Daily  . ipratropium  0.5 mg Nebulization Q6H  . levalbuterol  1.25 mg Nebulization Q6H  . methylPREDNISolone (SOLU-MEDROL) injection  60 mg Intravenous Q12H  . montelukast  10 mg Oral QHS  . multivitamin with minerals  1 tablet Oral Daily  . pantoprazole  40 mg Oral Daily  . sertraline  50 mg Oral Daily  . sodium chloride flush  3 mL Intravenous Q12H   Continuous Infusions:    LOS: 1 day    Time spent: 30 min    Renae FickleSHORT, Tavius Turgeon, MD [facility] Pager [phone]  If 7PM-7AM, please contact night-coverage [email or web address] Password [email or web address] 10/12/2016, 4:25 PM

## 2016-10-12 NOTE — Progress Notes (Signed)
SATURATION QUALIFICATIONS: (This note is used to comply with regulatory documentation for home oxygen)  Patient Saturations on Room Air at Rest = 93%  Patient Saturations on Room Air while Ambulating = 93-95%

## 2016-10-13 DIAGNOSIS — J4531 Mild persistent asthma with (acute) exacerbation: Secondary | ICD-10-CM

## 2016-10-13 MED ORDER — PANTOPRAZOLE SODIUM 40 MG PO TBEC
40.0000 mg | DELAYED_RELEASE_TABLET | Freq: Two times a day (BID) | ORAL | Status: DC
  Administered 2016-10-13 – 2016-10-15 (×4): 40 mg via ORAL
  Filled 2016-10-13 (×4): qty 1

## 2016-10-13 MED ORDER — METHYLPREDNISOLONE SODIUM SUCC 125 MG IJ SOLR
60.0000 mg | Freq: Once | INTRAMUSCULAR | Status: AC
  Administered 2016-10-13: 60 mg via INTRAVENOUS
  Filled 2016-10-13: qty 2

## 2016-10-13 NOTE — Progress Notes (Addendum)
PROGRESS NOTE  [name]  ZOX:[id number] DOB: 1981/01/04 DOA: 10/10/2016   PCP: Esperanza RichtersSaguier, Edward, PA-C  Brief Narrative:   35 y.o. female with history of Asthma since childhood who presented with progressively worsening shortness of breath and wheeze after taking care of her sister's dogs.  She initially used her albuterol inhaler but did not have relief and so she used her nebulizer several times before [facility].  CXR with diffuse interstitial and peribronchial thickening.  Still wheezing and short of breath.  Assessment & Plan:   Acute asthma exacerbation wearing oxygen for comfort due to increased WOB, still dyspneic with exertion  - Resp viral panel, influenza negative - Continue solumedrol but will add extra dose today as pt with more wheezing this AM  - continue Duonebs, PRN Xopenex nebs (uses it at home), Pulmicort - Z pak due to "possible pneumonia" seen on CXR - Mucinex, Flutter valve  Allergic rhinitis - stable, continue Flonase  Hypokalemia  - supplemented, BMP in AM  Depression  - with anxiety, stable, continue Zoloft and prn Clonazepam   ADD - stable, continue Adderal  Severe obesity (BMI >= 40) (HCC) - Body mass index is 41.16 kg/m  DVT prophylaxis: Lovenox Code Status: full code  Family Communication:  patient Disposition Plan:  home in 1-2 days depending on progression    Consultants:   None  Procedures:   None  Antimicrobials:   Azithromycin 10/22   Subjective:  Still feeling short of breath, particularly after bathroom trips and fits of coughing.   Objective: Vitals:   10/12/16 2207 10/13/16 0257 10/13/16 0701 10/13/16 0833  BP: (!) 123/52  (!) 99/56   Pulse: (!) 104  73 96  Resp: 16  14 16   Temp: 97.3 F (36.3 C)  98 F (36.7 C)   TempSrc: Oral  Oral   SpO2: 93% 94% 94%   Weight:      Height:        Intake/Output Summary (Last 24 hours) at 10/13/16 1007 Last data filed at 10/13/16 0300  Gross  per 24 hour  Intake              480 ml  Output                0 ml  Net              480 ml   Filed Weights   10/10/16 0102  Weight: 115.7 kg (255 lb)    Examination:  General exam:  Adult female, talks in full sentences but gasps at end before being able to continue  HEENT:  NCAT, MMM Respiratory system:  Course breath sounds with exp wheezing, no rhonchi  Cardiovascular system: RRR, normal S1/S2. No murmurs, rubs, gallops or clicks.  Warm extremities Gastrointestinal system: Normal active bowel sounds, soft, nondistended, nontender. MSK:  Normal tone and bulk, no lower extremity edema Neuro:  Grossly intact    Data Reviewed: I have personally reviewed following labs and imaging studies  CBC:  Recent Labs Lab 10/10/16 0152 10/10/16 0839  WBC 14.2* 12.9*  NEUTROABS 10.7*  --   HGB 13.0 13.2  HCT 38.5 39.0  MCV 90.2 90.5  PLT 292 322   Basic Metabolic Panel:  Recent Labs Lab 10/10/16 0152 10/10/16 0839  NA 138 139  K 3.2* 4.5  CL 105 108  CO2 26 25  GLUCOSE 110* 159*  BUN 10 7  CREATININE 0.72 0.67  CALCIUM 8.7*  8.9  MG  --  2.4   Liver Function Tests:  Recent Labs Lab 10/10/16 0839  AST 26  ALT 29  ALKPHOS 71  BILITOT 0.5  PROT 8.0  ALBUMIN 4.1   Cardiac Enzymes:  Recent Labs Lab 10/10/16 0839  TROPONINI <0.03   Urine analysis:    Component Value Date/Time   COLORURINE YELLOW 11/27/2012 1540   APPEARANCEUR HAZY (A) 11/27/2012 1540   LABSPEC 1.041 (H) 11/27/2012 1540   PHURINE 6.0 11/27/2012 1540   GLUCOSEU NEGATIVE 11/27/2012 1540   HGBUR LARGE (A) 11/27/2012 1540   BILIRUBINUR NEGATIVE 11/27/2012 1540   KETONESUR NEGATIVE 11/27/2012 1540   PROTEINUR NEGATIVE 11/27/2012 1540   UROBILINOGEN 0.2 11/27/2012 1540   NITRITE NEGATIVE 11/27/2012 1540   LEUKOCYTESUR NEGATIVE 11/27/2012 1540   Recent Results (from the past 240 hour(s))  Respiratory Panel by PCR     Status: None   Collection Time: 10/10/16  7:55 AM  Result Value Ref  Range Status   Adenovirus NOT DETECTED NOT DETECTED Final   Coronavirus 229E NOT DETECTED NOT DETECTED Final   Coronavirus HKU1 NOT DETECTED NOT DETECTED Final   Coronavirus NL63 NOT DETECTED NOT DETECTED Final   Coronavirus OC43 NOT DETECTED NOT DETECTED Final   Metapneumovirus NOT DETECTED NOT DETECTED Final   Rhinovirus / Enterovirus NOT DETECTED NOT DETECTED Final   Influenza A NOT DETECTED NOT DETECTED Final   Influenza B NOT DETECTED NOT DETECTED Final   Parainfluenza Virus 1 NOT DETECTED NOT DETECTED Final   Parainfluenza Virus 2 NOT DETECTED NOT DETECTED Final   Parainfluenza Virus 3 NOT DETECTED NOT DETECTED Final   Parainfluenza Virus 4 NOT DETECTED NOT DETECTED Final   Respiratory Syncytial Virus NOT DETECTED NOT DETECTED Final   Bordetella pertussis NOT DETECTED NOT DETECTED Final   Chlamydophila pneumoniae NOT DETECTED NOT DETECTED Final   Mycoplasma pneumoniae NOT DETECTED NOT DETECTED Final    Comment: Performed at [facility]      Radiology Studies: No results found.   Scheduled Meds: . amphetamine-dextroamphetamine  30 mg Oral QAC breakfast  . azithromycin  250 mg Oral Daily  . budesonide (PULMICORT) nebulizer solution  0.25 mg Nebulization BID  . clotrimazole  10 mg Oral 5 X Daily  . enoxaparin (LOVENOX) injection  60 mg Subcutaneous Q24H  . famotidine  20 mg Oral QHS  . fluticasone  2 spray Each Nare Daily  . ipratropium  0.5 mg Nebulization Q6H  . levalbuterol  1.25 mg Nebulization Q6H  . methylPREDNISolone (SOLU-MEDROL) injection  60 mg Intravenous Q12H  . montelukast  10 mg Oral QHS  . multivitamin with minerals  1 tablet Oral Daily  . pantoprazole  40 mg Oral BID  . sertraline  50 mg Oral Daily  . sodium chloride flush  3 mL Intravenous Q12H   Continuous Infusions:    LOS: 2 days    Time spent: 30 min    Debbora Presto, MD [facility] Pager [phone]  If 7PM-7AM, please contact  night-coverage [email or web address] Password [email or web address] 10/13/2016, 10:07 AM

## 2016-10-14 LAB — BASIC METABOLIC PANEL
Anion gap: 8 (ref 5–15)
BUN: 15 mg/dL (ref 6–20)
CHLORIDE: 105 mmol/L (ref 101–111)
CO2: 25 mmol/L (ref 22–32)
Calcium: 9 mg/dL (ref 8.9–10.3)
Creatinine, Ser: 0.69 mg/dL (ref 0.44–1.00)
Glucose, Bld: 140 mg/dL — ABNORMAL HIGH (ref 65–99)
POTASSIUM: 3.8 mmol/L (ref 3.5–5.1)
SODIUM: 138 mmol/L (ref 135–145)

## 2016-10-14 LAB — CBC
HCT: 40.3 % (ref 36.0–46.0)
HEMOGLOBIN: 13.3 g/dL (ref 12.0–15.0)
MCH: 30 pg (ref 26.0–34.0)
MCHC: 33 g/dL (ref 30.0–36.0)
MCV: 91 fL (ref 78.0–100.0)
Platelets: 393 10*3/uL (ref 150–400)
RBC: 4.43 MIL/uL (ref 3.87–5.11)
RDW: 13.3 % (ref 11.5–15.5)
WBC: 18.4 10*3/uL — AB (ref 4.0–10.5)

## 2016-10-14 MED ORDER — IBUPROFEN 200 MG PO TABS
600.0000 mg | ORAL_TABLET | Freq: Four times a day (QID) | ORAL | Status: DC | PRN
  Administered 2016-10-14 – 2016-10-15 (×2): 600 mg via ORAL
  Filled 2016-10-14 (×2): qty 3

## 2016-10-14 MED ORDER — METHYLPREDNISOLONE SODIUM SUCC 125 MG IJ SOLR
60.0000 mg | Freq: Three times a day (TID) | INTRAMUSCULAR | Status: DC
  Administered 2016-10-14 – 2016-10-15 (×3): 60 mg via INTRAVENOUS
  Filled 2016-10-14 (×3): qty 2

## 2016-10-14 NOTE — Progress Notes (Signed)
PROGRESS NOTE  [name]  XBJ:[id number] DOB: December 23, 1980 DOA: 10/10/2016   PCP: Esperanza RichtersSaguier, Edward, PA-C  Brief Narrative:   35 y.o. female with history of Asthma since childhood who presented with progressively worsening shortness of breath and wheeze after taking care of her sister's dogs.  She initially used her albuterol inhaler but did not have relief and so she used her nebulizer several times [facility].  CXR with diffuse interstitial and peribronchial thickening.  Still wheezing and short of breath.  Assessment & Plan:   Acute asthma exacerbation wearing oxygen for comfort due to increased WOB, still dyspneic with exertion  - Resp viral panel, influenza negative - increase solumedrol to TID as pt still wheezing and significant exertional dyspnea  - continue Duonebs, PRN Xopenex nebs (uses it at home), Pulmicort - continue Z pak due to "possible pneumonia" seen on CXR - Mucinex, Flutter valve  Allergic rhinitis - stable, continue Flonase  Hypokalemia  - supplemented and WNL this AM   Depression  - with anxiety, stable, continue Zoloft and prn Clonazepam   ADD - stable, continue Adderal  Severe obesity (BMI >= 40) (HCC) - Body mass index is 41.16 kg/m  DVT prophylaxis: Lovenox Code Status: full code  Family Communication:  patient Disposition Plan:  home in 1-2 days depending on progression and if able to wean off solumedrol    Consultants:   None  Procedures:   None  Antimicrobials:   Azithromycin 10/22   Subjective:  Still feeling short of breath, particularly with exertion.  Objective: Vitals:   10/13/16 2035 10/13/16 2100 10/14/16 0157 10/14/16 0639  BP:  138/60  113/63  Pulse:  (!) 101  68  Resp:  18  18  Temp:  98.4 F (36.9 C)  98.3 F (36.8 C)  TempSrc:  Oral  Oral  SpO2: 93% 97% 98% 95%  Weight:      Height:        Intake/Output Summary (Last 24 hours) at 10/14/16 1211 Last data filed at 10/14/16  0300  Gross per 24 hour  Intake              480 ml  Output                0 ml  Net              480 ml   Filed Weights   10/10/16 0102  Weight: 115.7 kg (255 lb)    Examination:  General exam:  Adult female, talks in full sentences but gasps at end before being able to continue  HEENT:  NCAT, MMM Respiratory system:  Course breath sounds with exp wheezing, worse on the right side Cardiovascular system: RRR, normal S1/S2. No murmurs, rubs, gallops or clicks.  Warm extremities Gastrointestinal system: Normal active bowel sounds, soft, nondistended, nontender. MSK:  Normal tone and bulk, no lower extremity edema Neuro:  Grossly intact    Data Reviewed: I have personally reviewed following labs and imaging studies  CBC:  Recent Labs Lab 10/10/16 0152 10/10/16 0839 10/14/16 0509  WBC 14.2* 12.9* 18.4*  NEUTROABS 10.7*  --   --   HGB 13.0 13.2 13.3  HCT 38.5 39.0 40.3  MCV 90.2 90.5 91.0  PLT 292 322 393   Basic Metabolic Panel:  Recent Labs Lab 10/10/16 0152 10/10/16 0839 10/14/16 0509  NA 138 139 138  K 3.2* 4.5 3.8  CL 105 108 105  CO2 26  25 25  GLUCOSE 110* 159* 140*  BUN 10 7 15   CREATININE 0.72 0.67 0.69  CALCIUM 8.7* 8.9 9.0  MG  --  2.4  --    Liver Function Tests:  Recent Labs Lab 10/10/16 0839  AST 26  ALT 29  ALKPHOS 71  BILITOT 0.5  PROT 8.0  ALBUMIN 4.1   Cardiac Enzymes:  Recent Labs Lab 10/10/16 0839  TROPONINI <0.03   Urine analysis:    Component Value Date/Time   COLORURINE YELLOW 11/27/2012 1540   APPEARANCEUR HAZY (A) 11/27/2012 1540   LABSPEC 1.041 (H) 11/27/2012 1540   PHURINE 6.0 11/27/2012 1540   GLUCOSEU NEGATIVE 11/27/2012 1540   HGBUR LARGE (A) 11/27/2012 1540   BILIRUBINUR NEGATIVE 11/27/2012 1540   KETONESUR NEGATIVE 11/27/2012 1540   PROTEINUR NEGATIVE 11/27/2012 1540   UROBILINOGEN 0.2 11/27/2012 1540   NITRITE NEGATIVE 11/27/2012 1540   LEUKOCYTESUR NEGATIVE 11/27/2012 1540   Recent Results (from the  past 240 hour(s))  Respiratory Panel by PCR     Status: None   Collection Time: 10/10/16  7:55 AM  Result Value Ref Range Status   Adenovirus NOT DETECTED NOT DETECTED Final   Coronavirus 229E NOT DETECTED NOT DETECTED Final   Coronavirus HKU1 NOT DETECTED NOT DETECTED Final   Coronavirus NL63 NOT DETECTED NOT DETECTED Final   Coronavirus OC43 NOT DETECTED NOT DETECTED Final   Metapneumovirus NOT DETECTED NOT DETECTED Final   Rhinovirus / Enterovirus NOT DETECTED NOT DETECTED Final   Influenza A NOT DETECTED NOT DETECTED Final   Influenza B NOT DETECTED NOT DETECTED Final   Parainfluenza Virus 1 NOT DETECTED NOT DETECTED Final   Parainfluenza Virus 2 NOT DETECTED NOT DETECTED Final   Parainfluenza Virus 3 NOT DETECTED NOT DETECTED Final   Parainfluenza Virus 4 NOT DETECTED NOT DETECTED Final   Respiratory Syncytial Virus NOT DETECTED NOT DETECTED Final   Bordetella pertussis NOT DETECTED NOT DETECTED Final   Chlamydophila pneumoniae NOT DETECTED NOT DETECTED Final   Mycoplasma pneumoniae NOT DETECTED NOT DETECTED Final    Comment: Performed at [facility]      Radiology Studies: No results found.   Scheduled Meds: . amphetamine-dextroamphetamine  30 mg Oral QAC breakfast  . budesonide (PULMICORT) nebulizer solution  0.25 mg Nebulization BID  . clotrimazole  10 mg Oral 5 X Daily  . enoxaparin (LOVENOX) injection  60 mg Subcutaneous Q24H  . famotidine  20 mg Oral QHS  . fluticasone  2 spray Each Nare Daily  . ipratropium  0.5 mg Nebulization Q6H  . levalbuterol  1.25 mg Nebulization Q6H  . methylPREDNISolone (SOLU-MEDROL) injection  60 mg Intravenous Q12H  . montelukast  10 mg Oral QHS  . multivitamin with minerals  1 tablet Oral Daily  . pantoprazole  40 mg Oral BID  . sertraline  50 mg Oral Daily  . sodium chloride flush  3 mL Intravenous Q12H   Continuous Infusions:    LOS: 3 days   Time spent: 30 min  Debbora Presto, MD [facility] Pager  [phone]  If 7PM-7AM, please contact night-coverage [email or web address] Password [email or web address] 10/14/2016, 12:11 PM

## 2016-10-15 MED ORDER — LEVALBUTEROL HCL 1.25 MG/3ML IN NEBU: 1.2500 mg | INHALATION_SOLUTION | RESPIRATORY_TRACT | 1 refills | Status: DC | PRN

## 2016-10-15 MED ORDER — ALBUTEROL SULFATE HFA 108 (90 BASE) MCG/ACT IN AERS: 2.0000 | INHALATION_SPRAY | Freq: Four times a day (QID) | RESPIRATORY_TRACT | 1 refills | Status: DC | PRN

## 2016-10-15 MED ORDER — GUAIFENESIN-DM 100-10 MG/5ML PO SYRP: 10.0000 mL | ORAL_SOLUTION | ORAL | 0 refills | Status: DC | PRN

## 2016-10-15 MED ORDER — MONTELUKAST SODIUM 10 MG PO TABS: 10.0000 mg | ORAL_TABLET | Freq: Every day | ORAL | 1 refills | Status: DC

## 2016-10-15 MED ORDER — PREDNISONE 10 MG (21) PO TBPK: ORAL_TABLET | ORAL | 0 refills | Status: DC

## 2016-10-15 MED ORDER — BUDESONIDE 0.25 MG/2ML IN SUSP: 0.2500 mg | Freq: Two times a day (BID) | RESPIRATORY_TRACT | 12 refills | Status: DC

## 2016-10-15 MED ORDER — IPRATROPIUM BROMIDE 0.02 % IN SOLN: 0.5000 mg | Freq: Four times a day (QID) | RESPIRATORY_TRACT | 1 refills | Status: DC | PRN

## 2016-10-15 MED ORDER — AZITHROMYCIN 250 MG PO TABS: 250.0000 mg | ORAL_TABLET | Freq: Every day | ORAL | 0 refills | Status: DC

## 2016-10-15 NOTE — Discharge Summary (Addendum)
Physician Discharge Summary  Nicole BLUNCK Stanton:[id number] DOB: 05/01/1981 DOA: 10/10/2016  PCP: Esperanza Richters, PA-C  Admit date: 10/10/2016 Discharge date: 10/15/2016  Recommendations for Outpatient Follow-up:  1. Pt will need to follow up with PCP in 2-3 weeks post discharge  Discharge Diagnoses:  Principal Problem:   Asthma exacerbation Active Problems:   Extrinsic asthma   Hypokalemia   Leukocytosis   Sinus tachycardia  Discharge Condition: Stable  Diet recommendation: Heart healthy diet discussed in details   Brief Narrative:   35 y.o.femalewith history of Asthma since childhood who presented with progressively worsening shortness of breath and wheeze after taking care of her sister's dogs.  She initially used her albuterol inhaler but did not have relief and so she used her nebulizer several times before coming to [facility] due to dyspnea.  CXR with diffuse interstitial and peribronchial thickening.  Still wheezing and short of breath.  Assessment & Plan:   Acute asthma exacerbation wearing oxygen for comfort due to increased WOB with underlying acute mucopurulent bronchitis and possible PNA but clinically undetermined  - Resp viral panel, influenza negative - provide Prednisone taper upon discharge  - continue Duonebs, PRN Xopenex nebs (uses it at home), Pulmicort - continue Z pak to complete therapy  - Mucinex, Flutter valve  Allergic rhinitis - stable, continue Flonase  Hypokalemia  - supplemented and WNL this AM   Depression  - with anxiety, stable, continue Zoloft and prn Clonazepam   ADD - stable, continue Adderal  Severe obesity (BMI >= 40) (HCC) - Body mass index is 41.16 kg/m  DVT prophylaxis:Lovenox Code Status:full code Family Communication:patient Disposition Plan: home    Consultants:   None  Procedures:   None  Antimicrobials:   Azithromycin 10/22    Procedures/Studies: Dg Chest Portable 1 View  Result  Date: 10/10/2016 CLINICAL DATA:  35 year old female with shortness of breath and asthma. EXAM: PORTABLE CHEST 1 VIEW COMPARISON:  Chest radiograph dated 11/28/2012 FINDINGS: Single-view of the chest demonstrate diffuse interstitial and peribronchial prominence which may represent bronchitis or viral pneumonia. There is no focal consolidation, pleural effusion, or pneumothorax. The cardiac silhouette is within normal limits. There is a thoracic dextroscoliosis. No acute osseous pathology. IMPRESSION: Diffuse interstitial and peribronchial thickening may represent bronchitis or viral pneumonia. Clinical correlation is recommended. Electronically Signed   By: Elgie Collard M.D.   On: 10/10/2016 02:31     Discharge Exam: Vitals:   10/14/16 2030 10/15/16 0525  BP: 124/84 127/74  Pulse: 91 81  Resp: 18 16  Temp: 98.9 F (37.2 C) 97.8 F (36.6 C)   Vitals:   10/14/16 2029 10/14/16 2030 10/15/16 0525 10/15/16 0957  BP:  124/84 127/74   Pulse:  91 81   Resp:  18 16   Temp:  98.9 F (37.2 C) 97.8 F (36.6 C)   TempSrc: Oral Oral Oral   SpO2:  100% 97% 97%  Weight:      Height:        General: Pt is alert, follows commands appropriately, not in acute distress Cardiovascular: Regular rate and rhythm, S1/S2 +, no murmurs, no rubs, no gallops Respiratory: Clear to auscultation bilaterally, minimal bibasilar wheezing, no crackles, no rhonchi Abdominal: Soft, non tender, non distended, bowel sounds +, no guarding Extremities: no edema, no cyanosis, pulses palpable bilaterally DP and PT Neuro: Grossly nonfocal  Discharge Instructions  Discharge Instructions    Diet - low sodium heart healthy    Complete by:  As directed  Increase activity slowly    Complete by:  As directed        Medication List    TAKE these medications   albuterol 108 (90 Base) MCG/ACT inhaler Commonly known as:  PROAIR HFA Inhale 2 puffs into the lungs every 6 (six) hours as needed for wheezing.    amphetamine-dextroamphetamine 30 MG 24 hr capsule Commonly known as:  ADDERALL XR Take 1 capsule (30 mg total) by mouth daily. What changed:  when to take this   azithromycin 250 MG tablet Commonly known as:  ZITHROMAX Take 1 tablet (250 mg total) by mouth daily. Take 2 tablets by mouth on day 1, followed by 1 tablet by mouth daily for 4 days. What changed:  how much to take  how to take this  when to take this   benzonatate 100 MG capsule Commonly known as:  TESSALON Take 1 capsule (100 mg total) by mouth 3 (three) times daily as needed.   budesonide 0.25 MG/2ML nebulizer solution Commonly known as:  PULMICORT Take 2 mLs (0.25 mg total) by nebulization 2 (two) times daily.   budesonide-formoterol 80-4.5 MCG/ACT inhaler Commonly known as:  SYMBICORT Inhale 2 puffs into the lungs 2 (two) times daily.   clonazePAM 0.5 MG tablet Commonly known as:  KLONOPIN Take 1 tablet (0.5 mg total) by mouth 2 (two) times daily as needed for anxiety.   clotrimazole 10 MG troche Commonly known as:  MYCELEX Take 1 tablet (10 mg total) by mouth 4 (four) times daily.   famotidine 20 MG tablet Commonly known as:  PEPCID Take 1 tablet (20 mg total) by mouth at bedtime.   fluticasone 50 MCG/ACT nasal spray Commonly known as:  FLONASE Place 1 spray into both nostrils 2 (two) times daily. What changed:  how much to take   guaiFENesin-dextromethorphan 100-10 MG/5ML syrup Commonly known as:  ROBITUSSIN DM Take 10 mLs by mouth every 4 (four) hours as needed for cough.   ipratropium 0.02 % nebulizer solution Commonly known as:  ATROVENT Take 2.5 mLs (0.5 mg total) by nebulization every 6 (six) hours as needed for wheezing or shortness of breath.   levalbuterol 1.25 MG/3ML nebulizer solution Commonly known as:  XOPENEX Take 1.25 mg by nebulization every 4 (four) hours as needed for wheezing.   Melatonin 10 MG Caps Take 20 mg by mouth at bedtime as needed (sleep).   montelukast 10 MG  tablet Commonly known as:  SINGULAIR Take 1 tablet (10 mg total) by mouth at bedtime.   MULTIVITAMIN ADULT PO Take 1 tablet by mouth every morning.   omeprazole 40 MG capsule Commonly known as:  PRILOSEC Take 1 capsule (40 mg total) by mouth daily. What changed:  when to take this   predniSONE 10 MG (21) Tbpk tablet Commonly known as:  STERAPRED UNI-PAK 21 TAB Take 60 mg tablet 10/28 and taper down by 10 mg daily until completed   sertraline 50 MG tablet Commonly known as:  ZOLOFT Take 1 tablet (50 mg total) by mouth daily. What changed:  when to take this      Follow-up Information    Saguier, Kateri Mcdward, PA-C .   Specialties:  Internal Medicine, Family Medicine Contact information: [address] [place] [address] [phone]        Debbora PrestoMAGICK-Aara Jacquot, MD .   Specialty:  Internal Medicine Contact information: [address] [place] [address] [phone]  The results of significant diagnostics from this hospitalization (including imaging, microbiology, ancillary and laboratory) are listed below for reference.     Microbiology: Recent Results (from the past 240 hour(s))  Respiratory Panel by PCR     Status: None   Collection Time: 10/10/16  7:55 AM  Result Value Ref Range Status   Adenovirus NOT DETECTED NOT DETECTED Final   Coronavirus 229E NOT DETECTED NOT DETECTED Final   Coronavirus HKU1 NOT DETECTED NOT DETECTED Final   Coronavirus NL63 NOT DETECTED NOT DETECTED Final   Coronavirus OC43 NOT DETECTED NOT DETECTED Final   Metapneumovirus NOT DETECTED NOT DETECTED Final   Rhinovirus / Enterovirus NOT DETECTED NOT DETECTED Final   Influenza A NOT DETECTED NOT DETECTED Final   Influenza B NOT DETECTED NOT DETECTED Final   Parainfluenza Virus 1 NOT DETECTED NOT DETECTED Final   Parainfluenza Virus 2 NOT DETECTED NOT DETECTED Final   Parainfluenza Virus 3 NOT DETECTED NOT DETECTED Final   Parainfluenza  Virus 4 NOT DETECTED NOT DETECTED Final   Respiratory Syncytial Virus NOT DETECTED NOT DETECTED Final   Bordetella pertussis NOT DETECTED NOT DETECTED Final   Chlamydophila pneumoniae NOT DETECTED NOT DETECTED Final   Mycoplasma pneumoniae NOT DETECTED NOT DETECTED Final    Comment: Performed at [facility]     Labs: Basic Metabolic Panel:  Recent Labs Lab 10/10/16 0152 10/10/16 0839 10/14/16 0509  NA 138 139 138  K 3.2* 4.5 3.8  CL 105 108 105  CO2 26 25 25   GLUCOSE 110* 159* 140*  BUN 10 7 15   CREATININE 0.72 0.67 0.69  CALCIUM 8.7* 8.9 9.0  MG  --  2.4  --    Liver Function Tests:  Recent Labs Lab 10/10/16 0839  AST 26  ALT 29  ALKPHOS 71  BILITOT 0.5  PROT 8.0  ALBUMIN 4.1   CBC:  Recent Labs Lab 10/10/16 0152 10/10/16 0839 10/14/16 0509  WBC 14.2* 12.9* 18.4*  NEUTROABS 10.7*  --   --   HGB 13.0 13.2 13.3  HCT 38.5 39.0 40.3  MCV 90.2 90.5 91.0  PLT 292 322 393   Cardiac Enzymes:  Recent Labs Lab 10/10/16 0839  TROPONINI <0.03   SIGNED: Time coordinating discharge: 30 minutes  Debbora Presto, MD  [facility] 10/15/2016, 12:02 PM Pager [phone]  If 7PM-7AM, please contact night-coverage [email or web address] Password [email or web address]

## 2016-10-15 NOTE — Discharge Instructions (Signed)
Asthma Attack Prevention While you may not be able to control the fact that you have asthma, you can take actions to prevent asthma attacks. The best way to prevent asthma attacks is to maintain good control of your asthma. You can achieve this by:  Taking your medicines as directed.  Avoiding things that can irritate your airways or make your asthma symptoms worse (asthma triggers).  Keeping track of how well your asthma is controlled and of any changes in your symptoms.  Responding quickly to worsening asthma symptoms (asthma attack).  Seeking emergency care when it is needed. WHAT ARE SOME WAYS TO PREVENT AN ASTHMA ATTACK? Have a Plan Work with your health care provider to create a written plan for managing and treating your asthma attacks (asthma action plan). This plan includes:  A list of your asthma triggers and how you can avoid them.  Information on when medicines should be taken and when their dosages should be changed.  The use of a device that measures how well your lungs are working (peak flow meter). Monitor Your Asthma Use your peak flow meter and record your results in a journal every day. A drop in your peak flow numbers on one or more days may indicate the start of an asthma attack. This can happen even before you start to feel symptoms. You can prevent an asthma attack from getting worse by following the steps in your asthma action plan. Avoid Asthma Triggers Work with your asthma health care provider to find out what your asthma triggers are. This can be done by:  Allergy testing.  Keeping a journal that notes when asthma attacks occur and the factors that may have contributed to them.  Determining if there are other medical conditions that are making your asthma worse. Once you have determined your asthma triggers, take steps to avoid them. This may include avoiding excessive or prolonged exposure to:  Dust. Have someone dust and vacuum your home for you once or  twice a week. Using a high-efficiency particulate arrestance (HEPA) vacuum is best.  Smoke. This includes campfire smoke, forest fire smoke, and secondhand smoke from tobacco products.  Pet dander. Avoid contact with animals that you know you are allergic to.  Allergens from trees, grasses or pollens. Avoid spending a lot of time outdoors when pollen counts are high, and on very windy days.  Very cold, dry, or humid air.  Mold.  Foods that contain high amounts of sulfites.  Strong odors.  Outdoor air pollutants, such as engine exhaust.  Indoor air pollutants, such as aerosol sprays and fumes from household cleaners.  Household pests, including dust mites and cockroaches, and pest droppings.  Certain medicines, including NSAIDs. Always talk to your health care provider before stopping or starting any new medicines. Medicines Take over-the-counter and prescription medicines only as told by your health care provider. Many asthma attacks can be prevented by carefully following your medicine schedule. Taking your medicines correctly is especially important when you cannot avoid certain asthma triggers. Act Quickly If an asthma attack does happen, acting quickly can decrease how severe it is and how long it lasts. Take these steps:   Pay attention to your symptoms. If you are coughing, wheezing, or having difficulty breathing, do not wait to see if your symptoms go away on their own. Follow your asthma action plan.  If you have followed your asthma action plan and your symptoms are not improving, call your health care provider or seek immediate medical care   at the nearest hospital. It is important to note how often you need to use your fast-acting rescue inhaler. If you are using your rescue inhaler more often, it may mean that your asthma is not under control. Adjusting your asthma treatment plan may help you to prevent future asthma attacks and help you to gain better control of your  condition. HOW CAN I PREVENT AN ASTHMA ATTACK WHEN I EXERCISE? Follow advice from your health care provider about whether you should use your fast-acting inhaler before exercising. Many people with asthma experience exercise-induced bronchoconstriction (EIB). This condition often worsens during vigorous exercise in cold, humid, or dry environments. Usually, people with EIB can stay very active by pre-treating with a fast-acting inhaler before exercising.   This information is not intended to replace advice given to you by your health care provider. Make sure you discuss any questions you have with your health care provider.   Document Released: 11/24/2009 Document Revised: 08/27/2015 Document Reviewed: 05/08/2015 Elsevier Interactive Patient Education 2016 Elsevier Inc.  

## 2016-10-17 ENCOUNTER — Telehealth: Payer: Self-pay | Admitting: Medical

## 2016-10-17 ENCOUNTER — Encounter: Payer: Self-pay | Admitting: Medical

## 2016-10-17 NOTE — Telephone Encounter (Signed)
[facility] [place] Night - Client TELEPHONE ADVICE RECORD [facility]  Patient Name: Alma FriendlyRISHA BUNDY  DOB: [birth year]    Initial Comment Caller States she has been on breathing treatments that are new for her, now she has blood in her stool, upset stomach   Nurse Assessment  Nurse: Laural BenesJohnson, RN, Dondra SpryGail Date/Time (Eastern Time): 10/17/2016 9:30:09 AM  Confirm and document reason for call. If symptomatic, describe symptoms. You must click the next button to save text entered. ---Bethann Berkshirerisha seen in ED last Sunday -- was sent to hospital and admitted for PNA and discharged Friday antibiotics and prednisone neb pulmocort atrovent zpak singular xophenix and rescue inhaler -- now experiencing rectal bleeding with mucus in them  Has the patient traveled out of the country within the last 30 days? ---No  Does the patient have any new or worsening symptoms? ---Yes  Will a triage be completed? ---Yes  Related visit to physician within the last 2 weeks? ---Yes  Does the PT have any chronic conditions? (i.e. diabetes, asthma, etc.) ---Yes  List chronic conditions. ---PNA  Is the patient pregnant or possibly pregnant? (Ask all females between the ages of 3312-55) ---No  Is this a behavioral health or substance abuse call? ---No     Guidelines    Guideline Title Affirmed Question Affirmed Notes       Final Disposition User        Comments  NOTE appt made on weekend d/t follow up needed for hospital stay for PNA and now having rectal bleeding with mucus appt given [id number] with Esperanza RichtersEdward Saguier, PA

## 2016-10-18 ENCOUNTER — Ambulatory Visit (HOSPITAL_BASED_OUTPATIENT_CLINIC_OR_DEPARTMENT_OTHER)
Admission: RE | Admit: 2016-10-18 | Discharge: 2016-10-18 | Disposition: A | Discharge status: Home / Self Care | Payer: Medicaid Other | Source: Ambulatory Visit | Attending: Medical | Admitting: Medical

## 2016-10-18 ENCOUNTER — Encounter: Payer: Self-pay | Admitting: Medical

## 2016-10-18 ENCOUNTER — Ambulatory Visit (INDEPENDENT_AMBULATORY_CARE_PROVIDER_SITE_OTHER): Payer: Managed Care, Other (non HMO) | Admitting: Medical

## 2016-10-18 ENCOUNTER — Telehealth: Payer: Self-pay | Admitting: *Deleted

## 2016-10-18 VITALS — BP 124/81 | HR 92 | Temp 98.3°F | Ht 66.0 in | Wt 240.0 lb

## 2016-10-18 DIAGNOSIS — J209 Acute bronchitis, unspecified: Secondary | ICD-10-CM

## 2016-10-18 DIAGNOSIS — R05 Cough: Secondary | ICD-10-CM | POA: Diagnosis present

## 2016-10-18 DIAGNOSIS — R062 Wheezing: Secondary | ICD-10-CM

## 2016-10-18 DIAGNOSIS — R197 Diarrhea, unspecified: Secondary | ICD-10-CM

## 2016-10-18 DIAGNOSIS — J45909 Unspecified asthma, uncomplicated: Secondary | ICD-10-CM | POA: Diagnosis not present

## 2016-10-18 DIAGNOSIS — K921 Melena: Secondary | ICD-10-CM

## 2016-10-18 LAB — CBC WITH DIFFERENTIAL/PLATELET
BASOS PCT: 0.3 % (ref 0.0–3.0)
Basophils Absolute: 0.1 10*3/uL (ref 0.0–0.1)
EOS ABS: 0.1 10*3/uL (ref 0.0–0.7)
EOS PCT: 0.4 % (ref 0.0–5.0)
HEMATOCRIT: 42.3 % (ref 36.0–46.0)
HEMOGLOBIN: 14.3 g/dL (ref 12.0–15.0)
LYMPHS PCT: 10.4 % — AB (ref 12.0–46.0)
Lymphs Abs: 2.1 10*3/uL (ref 0.7–4.0)
MCHC: 33.9 g/dL (ref 30.0–36.0)
MCV: 89.2 fl (ref 78.0–100.0)
MONOS PCT: 5.8 % (ref 3.0–12.0)
Monocytes Absolute: 1.2 10*3/uL — ABNORMAL HIGH (ref 0.1–1.0)
NEUTROS ABS: 16.7 10*3/uL — AB (ref 1.4–7.7)
Neutrophils Relative %: 83.1 % — ABNORMAL HIGH (ref 43.0–77.0)
PLATELETS: 427 10*3/uL — AB (ref 150.0–400.0)
RBC: 4.74 Mil/uL (ref 3.87–5.11)
RDW: 14 % (ref 11.5–15.5)

## 2016-10-18 LAB — COMPREHENSIVE METABOLIC PANEL
ALT: 23 U/L (ref 0–35)
AST: 9 U/L (ref 0–37)
Albumin: 3.8 g/dL (ref 3.5–5.2)
Alkaline Phosphatase: 70 U/L (ref 39–117)
BUN: 15 mg/dL (ref 6–23)
CALCIUM: 9.4 mg/dL (ref 8.4–10.5)
CHLORIDE: 100 meq/L (ref 96–112)
CO2: 33 meq/L — AB (ref 19–32)
CREATININE: 0.73 mg/dL (ref 0.40–1.20)
GFR: 97.12 mL/min (ref >60.00)
Glucose, Bld: 101 mg/dL — ABNORMAL HIGH (ref 70–99)
POTASSIUM: 3.9 meq/L (ref 3.5–5.1)
Sodium: 139 mEq/L (ref 135–145)
Total Bilirubin: 0.2 mg/dL (ref 0.2–1.2)
Total Protein: 6.8 g/dL (ref 6.0–8.3)

## 2016-10-18 MED ORDER — DIPHENOXYLATE-ATROPINE 2.5-0.025 MG PO TABS: 1.0000 | ORAL_TABLET | Freq: Four times a day (QID) | ORAL | 0 refills | Status: DC | PRN

## 2016-10-18 NOTE — Progress Notes (Signed)
Pre visit review using our clinic review tool, if applicable. No additional management support is needed unless otherwise documented below in the visit note. 

## 2016-10-18 NOTE — Patient Instructions (Addendum)
For your recent bloody stools but negative on check in our office, I want to get some stat labs, ifob and stool panel studies stat.  For wheezing and recent symptoms will get chest to se if pneumonia or bronchitis persists.  May need to stop zpack today or tomorrow depending on work up results.  Rest hydrate(with propel) and bland diet. Lomotil rx if diarrhea persists.(presently considering c dif.(vs inflammatory bowel disease vs diverticulitis).Based on her description of blood in stool. May need GI referral as well.  Follow up 4-5 days or as needed.(Follow up early morning or early afteroon recommended)

## 2016-10-18 NOTE — Progress Notes (Signed)
Subjective:    Patient ID: [name], female    DOB: January 30, 1981, 35 y.o.   MRN: [id number]  HPI  Pt was in hospital recently and discharged. She had some progressively worse wheezing and sob before admission. This occurred after taking care of her sisters dog. She had O2 sat in 80 range and states she was admitted. Then discharged on thispast  Friday.   Pt had work up and repiratory viral panel and flu study was negative.  Pt was given z-pack, prednisone, solumedrol and duoneb treatment.  Pt was also given pulmicort.  Pt states has 4 more days of prednisone tablets. She was also given rx for  singulair pulmicort, atrovent,xopenex and azithromycin  Pt does have a pulmonologist.   Also pt had some recent bloody stools. Pt had some diarrhea on Saturday all night. About 10-12 times. Watery that night. Sunday stools were loose as well but not a bad. She states looks blood with mucous in/on it. Pt denies of any hemorrhoids. No hx of inflammatory bowel disease. Pt states today about the same. Not as severe.  Pt has iud.    Review of Systems  Constitutional: Negative for chills, fatigue and fever.       Saturday felt feverish and sweating.  Respiratory: Positive for wheezing. Negative for chest tightness and shortness of breath.        Minimal now brating overall is better  Cardiovascular: Negative for chest pain and palpitations.  Gastrointestinal: Positive for blood in stool and diarrhea. Negative for abdominal pain, nausea, rectal pain and vomiting.  Genitourinary: Negative for dyspareunia and flank pain.  Musculoskeletal: Negative for back pain, myalgias and neck stiffness.  Skin: Negative for rash.  Neurological: Negative for dizziness, speech difficulty and headaches.  Hematological: Negative for adenopathy. Does not bruise/bleed easily.  Psychiatric/Behavioral: Negative for agitation, behavioral problems and confusion.   Past Medical History:  Diagnosis Date  . Asthma     . History of chicken pox   . Pneumonia   . UTI (urinary tract infection)      Social History   Social History  . Marital status: Divorced    Spouse name: N/A  . Number of children: 1  . Years of education: N/A   Occupational History  . Stay at home [employer]   Social History Main Topics  . Smoking status: Never Smoker  . Smokeless tobacco: Never Used  . Alcohol use No  . Drug use: No  . Sexual activity: Yes    Birth control/ protection: IUD   Other Topics Concern  . Not on file   Social History Narrative  . No narrative on file    Past Surgical History:  Procedure Laterality Date  . CESAREAN SECTION  09/27/12    Family History  Problem Relation Age of Onset  . Allergies Sister     Allergies  Allergen Reactions  . Aspirin Hives  . Levaquin [Levofloxacin In D5w] Hives  . Pamprin [Apap-Pamabrom-Pyrilamine] Hives    Current Outpatient Prescriptions on File Prior to Visit  Medication Sig Dispense Refill  . albuterol (PROAIR HFA) 108 (90 Base) MCG/ACT inhaler Inhale 2 puffs into the lungs every 6 (six) hours as needed for wheezing. 1 Inhaler 1  . amphetamine-dextroamphetamine (ADDERALL XR) 30 MG 24 hr capsule Take 1 capsule (30 mg total) by mouth daily. (Patient taking differently: Take 30 mg by mouth every morning. ) 30 capsule 0  . budesonide (PULMICORT) 0.25 MG/2ML nebulizer solution Take 2  mLs (0.25 mg total) by nebulization 2 (two) times daily. 60 mL 12  . budesonide-formoterol (SYMBICORT) 80-4.5 MCG/ACT inhaler Inhale 2 puffs into the lungs 2 (two) times daily. 1 Inhaler 0  . clonazePAM (KLONOPIN) 0.5 MG tablet Take 1 tablet (0.5 mg total) by mouth 2 (two) times daily as needed for anxiety. 20 tablet o  . clotrimazole (MYCELEX) 10 MG troche Take 1 tablet (10 mg total) by mouth 4 (four) times daily. 16 tablet 0  . famotidine (PEPCID) 20 MG tablet Take 1 tablet (20 mg total) by mouth at bedtime. 30 tablet 11  . fluticasone (FLONASE) 50  MCG/ACT nasal spray Place 1 spray into both nostrils 2 (two) times daily. (Patient taking differently: Place 2 sprays into both nostrils 2 (two) times daily. ) 16 g 5  . guaiFENesin-dextromethorphan (ROBITUSSIN DM) 100-10 MG/5ML syrup Take 10 mLs by mouth every 4 (four) hours as needed for cough. 118 mL 0  . ipratropium (ATROVENT) 0.02 % nebulizer solution Take 2.5 mLs (0.5 mg total) by nebulization every 6 (six) hours as needed for wheezing or shortness of breath. 75 mL 1  . levalbuterol (XOPENEX) 1.25 MG/3ML nebulizer solution Take 1.25 mg by nebulization every 4 (four) hours as needed for wheezing. 500 mL 1  . Melatonin 10 MG CAPS Take 20 mg by mouth at bedtime as needed (sleep).    . montelukast (SINGULAIR) 10 MG tablet Take 1 tablet (10 mg total) by mouth at bedtime. 30 tablet 1  . Multiple Vitamins-Minerals (MULTIVITAMIN ADULT PO) Take 1 tablet by mouth every morning.    Marland Kitchen. omeprazole (PRILOSEC) 40 MG capsule Take 1 capsule (40 mg total) by mouth daily. (Patient taking differently: Take 40 mg by mouth every morning. ) 30 capsule 11  . predniSONE (STERAPRED UNI-PAK 21 TAB) 10 MG (21) TBPK tablet Take 60 mg tablet 10/28 and taper down by 10 mg daily until completed 21 tablet 0  . sertraline (ZOLOFT) 50 MG tablet Take 1 tablet (50 mg total) by mouth daily. (Patient taking differently: Take 50 mg by mouth every morning. ) 30 tablet 1   No current facility-administered medications on file prior to visit.     BP 124/81 (BP Location: Left Arm, Patient Position: Sitting)   Pulse 92   Temp 98.3 F (36.8 C) (Oral)   Ht 5\' 6"  (1.676 m)   Wt 240 lb (108.9 kg)   SpO2 97%   BMI 38.74 kg/m       Objective:   Physical Exam  General  Mental Status - Alert. General Appearance - Well groomed. Not in acute distress.  Skin Rashes- No Rashes.  HEENT Head- Normal. Ear Auditory Canal - Left- Normal. Right - Normal.Tympanic Membrane- Left- Normal. Right- Normal. Eye Sclera/Conjunctiva- Left-  Normal. Right- Normal. Nose & Sinuses Nasal Mucosa- Left-  Notb oggy or Congested. Right-  Not  boggy or Congested. Mouth & Throat Lips: Upper Lip- Normal: no dryness, cracking, pallor, cyanosis, or vesicular eruption. Lower Lip-Normal: no dryness, cracking, pallor, cyanosis or vesicular eruption. Buccal Mucosa- Bilateral- No Aphthous ulcers. Oropharynx- No Discharge or Erythema. Tonsils: Characteristics- Bilateral- No Erythema or Congestion. Size/Enlargement- Bilateral- No enlargement. Discharge- bilateral-None.  Neck Neck- Supple. No Masses.   Chest and Lung Exam Auscultation: Breath Sounds:- even and unlabored. No obvious rough breath sounds  Cardiovascular Auscultation:Rythm- Regular, rate and rhythm. Murmurs & Other Heart Sounds:Ausculatation of the heart reveal- No Murmurs.  Lymphatic Head & Neck General Head & Neck Lymphatics: Bilateral: Description- No Localized lymphadenopathy.  Abdomen Inspection:-Inspection Normal.  Palpation/Perucssion: Palpation and Percussion of the abdomen reveal- Non Tender, No Rebound tenderness, No rigidity(Guarding) and No Palpable abdominal masses.  Liver:-Normal.  Spleen:- Normal.   Rectal Anorectal Exam: Stool - Hemoccult of stool/mucous is Heme Negative. External - normal external exam. Internal - normal sphincter tone. No rectal mass. But tender rectal area.      Assessment & Plan:  For your recent bloody stools but negative on check in our office, I want to get some stat labs, ifob and stool panel studies stat.  For wheezing and recent symptoms will get chest to se if pneumonia or bronchitis persists.  May need to stop zpack today or tomorrow depending on work up results.  Rest hydrate and bland diet. Lomotil rx if diarrhea persists.(presently considering c dif.(vs inflammatory bowel disease vs diverticulitis).Based on her description of blood in stool.May need GI referral as well.  Also considering she may have internal  hemorrhoid?  Follow up 4-5 days or as needed.(Follow up early morning or early afteroon recommended)    Esperanza Richters/Bernardina Cacho, PA-C

## 2016-10-18 NOTE — Telephone Encounter (Signed)
Critical labs for this patient.

## 2016-10-18 NOTE — Telephone Encounter (Signed)
Reviewed cbc/wbc after hours. Pt clinically did not appear to have infection. Her chest xray showed clearing from prior xray.   I think elevated wbc due to her use of steroid in hospital and continued use post hospitalization. Will try to contact her tomorrow to confirm she is still stable.

## 2016-10-21 LAB — OVA AND PARASITE EXAMINATION: OP: NONE SEEN

## 2016-10-21 LAB — CLOSTRIDIUM DIFFICILE BY PCR: CDIFFPCR: NOT DETECTED

## 2016-10-22 ENCOUNTER — Ambulatory Visit (INDEPENDENT_AMBULATORY_CARE_PROVIDER_SITE_OTHER): Payer: Managed Care, Other (non HMO) | Admitting: Medical

## 2016-10-22 ENCOUNTER — Emergency Department (HOSPITAL_BASED_OUTPATIENT_CLINIC_OR_DEPARTMENT_OTHER)
Admission: EM | Admit: 2016-10-22 | Discharge: 2016-10-22 | Disposition: A | Discharge status: Home / Self Care | Payer: Medicaid Other | Attending: Emergency Medicine | Admitting: Emergency Medicine

## 2016-10-22 ENCOUNTER — Encounter: Payer: Self-pay | Admitting: Medical

## 2016-10-22 ENCOUNTER — Emergency Department (HOSPITAL_BASED_OUTPATIENT_CLINIC_OR_DEPARTMENT_OTHER): Payer: Medicaid Other

## 2016-10-22 ENCOUNTER — Encounter (HOSPITAL_BASED_OUTPATIENT_CLINIC_OR_DEPARTMENT_OTHER): Payer: Self-pay | Admitting: *Deleted

## 2016-10-22 VITALS — BP 120/80 | Temp 98.0°F | Ht 66.0 in | Wt 251.0 lb

## 2016-10-22 DIAGNOSIS — K921 Melena: Secondary | ICD-10-CM | POA: Diagnosis not present

## 2016-10-22 DIAGNOSIS — J45909 Unspecified asthma, uncomplicated: Secondary | ICD-10-CM | POA: Diagnosis not present

## 2016-10-22 DIAGNOSIS — L749 Eccrine sweat disorder, unspecified: Secondary | ICD-10-CM | POA: Diagnosis not present

## 2016-10-22 DIAGNOSIS — R1011 Right upper quadrant pain: Secondary | ICD-10-CM

## 2016-10-22 DIAGNOSIS — F909 Attention-deficit hyperactivity disorder, unspecified type: Secondary | ICD-10-CM | POA: Diagnosis not present

## 2016-10-22 DIAGNOSIS — R109 Unspecified abdominal pain: Secondary | ICD-10-CM

## 2016-10-22 LAB — COMPREHENSIVE METABOLIC PANEL
ALBUMIN: 4 g/dL (ref 3.5–5.0)
ALK PHOS: 58 U/L (ref 38–126)
ALT: 27 U/L (ref 14–54)
AST: 18 U/L (ref 15–41)
Anion gap: 9 (ref 5–15)
BILIRUBIN TOTAL: 0.5 mg/dL (ref 0.3–1.2)
BUN: 12 mg/dL (ref 6–20)
CALCIUM: 9.1 mg/dL (ref 8.9–10.3)
CO2: 25 mmol/L (ref 22–32)
Chloride: 101 mmol/L (ref 101–111)
Creatinine, Ser: 0.7 mg/dL (ref 0.44–1.00)
GFR calc Af Amer: >60 mL/min (ref >60)
GLUCOSE: 129 mg/dL — AB (ref 65–99)
POTASSIUM: 4.4 mmol/L (ref 3.5–5.1)
Sodium: 135 mmol/L (ref 135–145)
TOTAL PROTEIN: 7.5 g/dL (ref 6.5–8.1)

## 2016-10-22 LAB — URINE MICROSCOPIC-ADD ON

## 2016-10-22 LAB — URINALYSIS, ROUTINE W REFLEX MICROSCOPIC
BILIRUBIN URINE: NEGATIVE
GLUCOSE, UA: NEGATIVE mg/dL
HGB URINE DIPSTICK: NEGATIVE
Ketones, ur: NEGATIVE mg/dL
Nitrite: NEGATIVE
PROTEIN: NEGATIVE mg/dL
Specific Gravity, Urine: 1.008 (ref 1.005–1.030)
pH: 7.5 (ref 5.0–8.0)

## 2016-10-22 LAB — CBC
HEMATOCRIT: 44.6 % (ref 36.0–46.0)
Hemoglobin: 15 g/dL (ref 12.0–15.0)
MCH: 30.5 pg (ref 26.0–34.0)
MCHC: 33.6 g/dL (ref 30.0–36.0)
MCV: 90.8 fL (ref 78.0–100.0)
PLATELETS: 348 10*3/uL (ref 150–400)
RBC: 4.91 MIL/uL (ref 3.87–5.11)
RDW: 13.2 % (ref 11.5–15.5)
WBC: 18.6 10*3/uL — AB (ref 4.0–10.5)

## 2016-10-22 LAB — PREGNANCY, URINE: PREG TEST UR: NEGATIVE

## 2016-10-22 LAB — LIPASE, BLOOD: Lipase: 20 U/L (ref 11–51)

## 2016-10-22 MED ORDER — IOPAMIDOL (ISOVUE-300) INJECTION 61%: 100.0000 mL | Freq: Once | INTRAVENOUS | Status: DC | PRN

## 2016-10-22 MED ORDER — METRONIDAZOLE 500 MG PO TABS: 500.0000 mg | ORAL_TABLET | Freq: Three times a day (TID) | ORAL | 0 refills | Status: DC

## 2016-10-22 MED ORDER — HYDROCODONE-ACETAMINOPHEN 5-325 MG PO TABS: 1.0000 | ORAL_TABLET | Freq: Four times a day (QID) | ORAL | 0 refills | Status: DC | PRN

## 2016-10-22 NOTE — Discharge Instructions (Signed)
Flagyl as prescribed.  Hydrocodone as prescribed as needed for pain.  Follow-up with [facility] gastroenterology if your symptoms are not improving in the next 2 days. The contact information for the office has been provided in this discharge summary for you to call and make these arrangements.  Return to the emergency department if you develop worsening pain, high fever, worsening bleeding, or other new and concerning symptoms.

## 2016-10-22 NOTE — ED Triage Notes (Addendum)
Rectal bleeding with mucus x 1 week. She was seen by her MD on Monday and was tested for cdiff that was negative and she had an elevated WBC. Today she went for follow up. She was sent here for right mid quadrant pain evaluation. Hx of [facility] that was treated with IV antibiotics.

## 2016-10-22 NOTE — Patient Instructions (Addendum)
With your sweating, severe abdomen pain on exam and pain after eating, I do think you need ED evaluation with stat labs. Evaluate your gallbladder.  Also your recent prior diffuse abdomen pain with blood stools is also a concern.   I think you would benefit from ED evaluation now.  Follow up with us after ED evaluation as they determine.  Informed MD in ED of recent presentation.

## 2016-10-22 NOTE — ED Provider Notes (Signed)
[facility]   CSN: [id number] Arrival date & time: 10/22/16  1316     History   Chief Complaint Chief Complaint  Patient presents with  . Abdominal Pain    HPI [name] is a 35 y.o. female.  Patient is a 35 year old female with past medical history of asthma with recent hospitalization for hypoxemia and suspected pneumonia. She presents today for evaluation of right-sided abdominal pain that is crampy in nature and associated with loose, bloody stools. This has been worsening over the past several days. She denies any fevers or chills. She denies any dizziness or lightheadedness. She was seen earlier this week by her primary Dr. in follow-up for her previous hospitalization. She underwent stool cultures, all which have returned unremarkable. She was sent [facility] from [facility] upstairs for further evaluation.   The history is provided by the patient.  Abdominal Pain   This is a new problem. Episode onset: 5 days ago. The problem occurs constantly. The problem has been gradually worsening. The pain is associated with eating. The pain is located in the RUQ and RLQ. The quality of the pain is cramping. The pain is moderate. Associated symptoms include diarrhea and hematochezia. Exacerbated by: Eating and palpation. Nothing relieves the symptoms.    Past Medical History:  Diagnosis Date  . Asthma   . History of chicken pox   . Pneumonia   . UTI (urinary tract infection)     Patient Active Problem List   Diagnosis Date Noted  . Asthma exacerbation 10/10/2016  . Respiratory failure, acute (HCC) 10/10/2016  . ADD (attention deficit disorder) 10/10/2016  . Depression with anxiety 08/13/2014  . Severe obesity (BMI >= 40) (HCC) 08/13/2014  . Leukocytosis 11/27/2012  . Dehydration 11/27/2012  . Sinus tachycardia 11/27/2012  . Hypokalemia 11/26/2012  . Extrinsic asthma 08/17/2011    Past Surgical History:  Procedure Laterality Date  .  CESAREAN SECTION  09/27/12    OB History    No data available       Home Medications    Prior to Admission medications   Medication Sig Start Date End Date Taking? Authorizing Provider  albuterol (PROAIR HFA) 108 (90 Base) MCG/ACT inhaler Inhale 2 puffs into the lungs every 6 (six) hours as needed for wheezing. 10/15/16   Dorothea OgleIskra M Myers, MD  amphetamine-dextroamphetamine (ADDERALL XR) 30 MG 24 hr capsule Take 1 capsule (30 mg total) by mouth daily. Patient taking differently: Take 30 mg by mouth every morning.  09/21/16   Waldon MerlWilliam C Martin, PA-C  budesonide (PULMICORT) 0.25 MG/2ML nebulizer solution Take 2 mLs (0.25 mg total) by nebulization 2 (two) times daily. 10/15/16   Dorothea OgleIskra M Myers, MD  budesonide-formoterol (SYMBICORT) 80-4.5 MCG/ACT inhaler Inhale 2 puffs into the lungs 2 (two) times daily. 08/27/16   Nyoka CowdenMichael B Wert, MD  clonazePAM (KLONOPIN) 0.5 MG tablet Take 1 tablet (0.5 mg total) by mouth 2 (two) times daily as needed for anxiety. 06/21/16   Ramon DredgeEdward Saguier, PA-C  clotrimazole (MYCELEX) 10 MG troche Take 1 tablet (10 mg total) by mouth 4 (four) times daily. 08/27/16   Nyoka CowdenMichael B Wert, MD  diphenoxylate-atropine (LOMOTIL) 2.5-0.025 MG tablet Take 1 tablet by mouth 4 (four) times daily as needed for diarrhea or loose stools. 10/18/16   Ramon DredgeEdward Saguier, PA-C  famotidine (PEPCID) 20 MG tablet Take 1 tablet (20 mg total) by mouth at bedtime. 06/23/16   Nyoka CowdenMichael B Wert, MD  fluticasone Aleda Grana(FLONASE) 50 MCG/ACT nasal spray Place  1 spray into both nostrils 2 (two) times daily. Patient taking differently: Place 2 sprays into both nostrils 2 (two) times daily.  06/23/16   Nyoka Cowden, MD  guaiFENesin-dextromethorphan (ROBITUSSIN DM) 100-10 MG/5ML syrup Take 10 mLs by mouth every 4 (four) hours as needed for cough. 10/15/16   Dorothea Ogle, MD  ipratropium (ATROVENT) 0.02 % nebulizer solution Take 2.5 mLs (0.5 mg total) by nebulization every 6 (six) hours as needed for wheezing or shortness of breath.  10/15/16   Dorothea Ogle, MD  levalbuterol (XOPENEX) 1.25 MG/3ML nebulizer solution Take 1.25 mg by nebulization every 4 (four) hours as needed for wheezing. 10/15/16   Dorothea Ogle, MD  Melatonin 10 MG CAPS Take 20 mg by mouth at bedtime as needed (sleep).    Historical Provider, MD  montelukast (SINGULAIR) 10 MG tablet Take 1 tablet (10 mg total) by mouth at bedtime. 10/15/16   Dorothea Ogle, MD  Multiple Vitamins-Minerals (MULTIVITAMIN ADULT PO) Take 1 tablet by mouth every morning.    Historical Provider, MD  omeprazole (PRILOSEC) 40 MG capsule Take 1 capsule (40 mg total) by mouth daily. Patient taking differently: Take 40 mg by mouth every morning.  06/23/16   Nyoka Cowden, MD  predniSONE (STERAPRED UNI-PAK 21 TAB) 10 MG (21) TBPK tablet Take 60 mg tablet 10/28 and taper down by 10 mg daily until completed 10/15/16   Dorothea Ogle, MD  sertraline (ZOLOFT) 50 MG tablet Take 1 tablet (50 mg total) by mouth daily. Patient taking differently: Take 50 mg by mouth every morning.  08/13/16   Esperanza Richters, PA-C    Family History Family History  Problem Relation Age of Onset  . Allergies Sister     Social History Social History  Substance Use Topics  . Smoking status: Never Smoker  . Smokeless tobacco: Never Used  . Alcohol use No     Allergies   Aspirin; Levaquin [levofloxacin in d5w]; and Pamprin [apap-pamabrom-pyrilamine]   Review of Systems Review of Systems  Gastrointestinal: Positive for abdominal pain, diarrhea and hematochezia.  All other systems reviewed and are negative.    Physical Exam Updated Vital Signs BP 125/86   Pulse 98   Temp 98.3 F (36.8 C) (Oral)   Resp 20   Ht 5\' 6"  (1.676 m)   Wt 251 lb (113.9 kg)   SpO2 99%   BMI 40.51 kg/m   Physical Exam  Constitutional: She is oriented to person, place, and time. She appears well-developed and well-nourished. No distress.  HENT:  Head: Normocephalic and atraumatic.  Neck: Normal range of motion. Neck  supple.  Cardiovascular: Normal rate and regular rhythm.  Exam reveals no gallop and no friction rub.   No murmur heard. Pulmonary/Chest: Effort normal and breath sounds normal. No respiratory distress. She has no wheezes.  Abdominal: Soft. Bowel sounds are normal. She exhibits no distension. There is tenderness. There is no rebound and no guarding.  There is tenderness to palpation in the right upper and right lower quadrant.  Musculoskeletal: Normal range of motion.  Neurological: She is alert and oriented to person, place, and time.  Skin: Skin is warm and dry. She is not diaphoretic.  Nursing note and vitals reviewed.    ED Treatments / Results  Labs (all labs ordered are listed, but only abnormal results are displayed) Labs Reviewed  CBC - Abnormal; Notable for the following:       Result Value   WBC 18.6 (*)  All other components within normal limits  URINALYSIS, ROUTINE W REFLEX MICROSCOPIC (NOT AT [facility])  PREGNANCY, URINE  LIPASE, BLOOD  COMPREHENSIVE METABOLIC PANEL    EKG  EKG Interpretation None       Radiology No results found.  Procedures Procedures (including critical care time)  Medications Ordered in ED Medications - No data to display   Initial Impression / Assessment and Plan / ED Course  I have reviewed the triage vital signs and the nursing notes.  Pertinent labs & imaging results that were available during my care of the patient were reviewed by me and considered in my medical decision making (see chart for details).  Clinical Course    Patient presents with complaints of abdominal discomfort, bloody stools that have been worsening since discharge after an admission for pneumonia. Her stool cultures as an outpatient have been negative for C. difficile or other pathogens. She is tender to palpation in the right upper quadrant and right lower quadrant and she does have an elevated white count, however CT scan does not reveal any acute pathology.  She appears clinically well with a somewhat confusing clinical picture. At this point, I have decided to treat with Flagyl for bloody diarrhea along with pain medication and follow-up with gastroenterology if she is not improving.  Final Clinical Impressions(s) / ED Diagnoses   Final diagnoses:  None    New Prescriptions New Prescriptions   No medications on file     Geoffery Lyonsouglas Jaydon Soroka, MD 10/22/16 1730

## 2016-10-22 NOTE — ED Notes (Signed)
Pt at desk requesting if she could have something to eat. Pt instructed she could not eat until after results from tests come back.

## 2016-10-22 NOTE — Progress Notes (Signed)
Pre visit review using our clinic review tool, if applicable. No additional management support is needed unless otherwise documented below in the visit note. 

## 2016-10-22 NOTE — Progress Notes (Signed)
Subjective:    Patient ID: [name], female    DOB: [birth year], 35 y.o.   MRN: [id number]  HPI  Pt in for follow up.  Also pt had some recent bloody stools. Pt had some diarrhea on Saturday all night. About 10-12 times. Watery that night. Sunday stools were loose as well but not a bad. She states looks blood with mucous in/on it. Pt denies of any hemorrhoids. No hx of inflammatory bowel disease. Pt states today about the same. Not as severe.  Above is from last visit.  When I had talked with pt on Tuesday she did not have any loose stools. She was not having any bm byt then. I advised to only take one dulcolax. Since then she started have bowel movements again. But states some bright red blood appearance mixed in stool. Pt states has been sweating some almost daily since last visit. Pt also having some lower quadrant region pain over last 2 days.  But also some rt upper quadrant pain recently with eating x 3 days. Pain severe at times. Sweating as stated above as well.   Review of Systems  Constitutional: Positive for diaphoresis. Negative for chills, fatigue and fever.  Respiratory: Negative for cough, chest tightness and shortness of breath.   Cardiovascular: Negative for chest pain.  Gastrointestinal: Positive for abdominal pain, blood in stool and nausea. Negative for abdominal distention, rectal pain and vomiting.       See hpi.  Genitourinary: Negative for difficulty urinating, dysuria and flank pain.  Musculoskeletal: Negative for back pain and neck pain.  Neurological: Negative for dizziness, syncope, weakness, numbness and headaches.  Hematological: Negative for adenopathy. Does not bruise/bleed easily.  Psychiatric/Behavioral: Negative for behavioral problems, confusion, self-injury and sleep disturbance. The patient is not nervous/anxious.    Past Medical History:  Diagnosis Date  . Asthma   . History of chicken pox   . Pneumonia   . UTI (urinary tract infection)       Social History   Social History  . Marital status: Divorced    Spouse name: N/A  . Number of children: 1  . Years of education: N/A   Occupational History  . Stay at home [employer]   Social History Main Topics  . Smoking status: Never Smoker  . Smokeless tobacco: Never Used  . Alcohol use No  . Drug use: No  . Sexual activity: Yes    Birth control/ protection: IUD   Other Topics Concern  . Not on file   Social History Narrative  . No narrative on file    Past Surgical History:  Procedure Laterality Date  . CESAREAN SECTION  09/27/12    Family History  Problem Relation Age of Onset  . Allergies Sister     Allergies  Allergen Reactions  . Aspirin Hives  . Levaquin [Levofloxacin In D5w] Hives  . Pamprin [Apap-Pamabrom-Pyrilamine] Hives    Current Outpatient Prescriptions on File Prior to Visit  Medication Sig Dispense Refill  . albuterol (PROAIR HFA) 108 (90 Base) MCG/ACT inhaler Inhale 2 puffs into the lungs every 6 (six) hours as needed for wheezing. 1 Inhaler 1  . amphetamine-dextroamphetamine (ADDERALL XR) 30 MG 24 hr capsule Take 1 capsule (30 mg total) by mouth daily. (Patient taking differently: Take 30 mg by mouth every morning. ) 30 capsule 0  . budesonide (PULMICORT) 0.25 MG/2ML nebulizer solution Take 2 mLs (0.25 mg total) by nebulization 2 (two) times daily. 60 mL 12  .  budesonide-formoterol (SYMBICORT) 80-4.5 MCG/ACT inhaler Inhale 2 puffs into the lungs 2 (two) times daily. 1 Inhaler 0  . clonazePAM (KLONOPIN) 0.5 MG tablet Take 1 tablet (0.5 mg total) by mouth 2 (two) times daily as needed for anxiety. 20 tablet o  . clotrimazole (MYCELEX) 10 MG troche Take 1 tablet (10 mg total) by mouth 4 (four) times daily. 16 tablet 0  . diphenoxylate-atropine (LOMOTIL) 2.5-0.025 MG tablet Take 1 tablet by mouth 4 (four) times daily as needed for diarrhea or loose stools. 16 tablet 0  . famotidine (PEPCID) 20 MG tablet Take 1 tablet (20 mg  total) by mouth at bedtime. 30 tablet 11  . fluticasone (FLONASE) 50 MCG/ACT nasal spray Place 1 spray into both nostrils 2 (two) times daily. (Patient taking differently: Place 2 sprays into both nostrils 2 (two) times daily. ) 16 g 5  . guaiFENesin-dextromethorphan (ROBITUSSIN DM) 100-10 MG/5ML syrup Take 10 mLs by mouth every 4 (four) hours as needed for cough. 118 mL 0  . ipratropium (ATROVENT) 0.02 % nebulizer solution Take 2.5 mLs (0.5 mg total) by nebulization every 6 (six) hours as needed for wheezing or shortness of breath. 75 mL 1  . levalbuterol (XOPENEX) 1.25 MG/3ML nebulizer solution Take 1.25 mg by nebulization every 4 (four) hours as needed for wheezing. 500 mL 1  . Melatonin 10 MG CAPS Take 20 mg by mouth at bedtime as needed (sleep).    . montelukast (SINGULAIR) 10 MG tablet Take 1 tablet (10 mg total) by mouth at bedtime. 30 tablet 1  . Multiple Vitamins-Minerals (MULTIVITAMIN ADULT PO) Take 1 tablet by mouth every morning.    Marland Kitchen. omeprazole (PRILOSEC) 40 MG capsule Take 1 capsule (40 mg total) by mouth daily. (Patient taking differently: Take 40 mg by mouth every morning. ) 30 capsule 11  . predniSONE (STERAPRED UNI-PAK 21 TAB) 10 MG (21) TBPK tablet Take 60 mg tablet 10/28 and taper down by 10 mg daily until completed 21 tablet 0  . sertraline (ZOLOFT) 50 MG tablet Take 1 tablet (50 mg total) by mouth daily. (Patient taking differently: Take 50 mg by mouth every morning. ) 30 tablet 1   No current facility-administered medications on file prior to visit.     BP 120/80 (BP Location: Left Arm, Patient Position: Sitting)   Temp 98 F (36.7 C) (Oral)   Ht 5\' 6"  (1.676 m)   Wt 251 lb (113.9 kg)   SpO2 98%   BMI 40.51 kg/m       Objective:   Physical Exam  General Appearance- Not in acute distress.  HEENT Eyes- Scleraeral/Conjuntiva-bilat- Not Yellow. Mouth & Throat- Normal.  Chest and Lung Exam Auscultation: Breath sounds:-Normal. Adventitious sounds:- No  Adventitious sounds.  Cardiovascular Auscultation:Rythm - Regular. Heart Sounds -Normal heart sounds.  Abdomen Inspection:-Inspection Normal.  Palpation/Perucssion: Palpation and Percussion of the abdomen reveal- bilaeral mild lower quadrant pain, but moderate to severe rt upper quadrant pain on palpation, No Rebound tenderness, No rigidity(Guarding) and No Palpable abdominal masses.  Liver:-Normal.  Spleen:- Normal.    Back- no cva tenderness.     Assessment & Plan:  With your sweating, severe abdomen pain on exam and pain after eating, I do think you need ED evaluation with stat labs. Evaluate your gallbladder.  Also your recent prior diffuse abdomen pain with blood stools is also a concern.   I think you would benefit from ED evaluation now.  Follow up with us after ED evaluation as they determine.  Tanielle Emigh, Ramon DredgeEdward,  PA-C

## 2016-10-24 LAB — STOOL CULTURE

## 2016-10-25 ENCOUNTER — Telehealth: Payer: Self-pay | Admitting: Medical

## 2016-10-25 ENCOUNTER — Other Ambulatory Visit (INDEPENDENT_AMBULATORY_CARE_PROVIDER_SITE_OTHER): Payer: Managed Care, Other (non HMO)

## 2016-10-25 DIAGNOSIS — R197 Diarrhea, unspecified: Secondary | ICD-10-CM | POA: Diagnosis not present

## 2016-10-25 DIAGNOSIS — R109 Unspecified abdominal pain: Secondary | ICD-10-CM

## 2016-10-25 DIAGNOSIS — K921 Melena: Secondary | ICD-10-CM | POA: Diagnosis not present

## 2016-10-25 LAB — FECAL OCCULT BLOOD, IMMUNOCHEMICAL: FECAL OCCULT BLD: POSITIVE — AB

## 2016-10-25 NOTE — Telephone Encounter (Signed)
This needs to be forwarded to the covering provider for IdealEdward today.

## 2016-10-25 NOTE — Telephone Encounter (Signed)
Caller name: Relationship to patient: Self Can be reached: [phone] Pharmacy:  Reason for call: Request referral to have a Colonoscopy and Endoscopy. States she was seen in the ER on Friday 11/3 and advise to get both done. Plse adv

## 2016-10-25 NOTE — Progress Notes (Signed)
Pt has seen results on MyChart and message also sent for patient to call back if any questions.

## 2016-10-26 NOTE — Telephone Encounter (Signed)
She needs referral to GI and then they will schedule if needed

## 2016-10-26 NOTE — Telephone Encounter (Signed)
Referral to gi placed. °

## 2016-10-27 ENCOUNTER — Telehealth: Payer: Self-pay

## 2016-10-27 ENCOUNTER — Telehealth: Payer: Self-pay | Admitting: Medical

## 2016-10-27 ENCOUNTER — Encounter: Payer: Self-pay | Admitting: Gastroenterology

## 2016-10-27 MED ORDER — AMPHETAMINE-DEXTROAMPHET ER 30 MG PO CP24: 30.0000 mg | ORAL_CAPSULE | Freq: Every day | ORAL | 0 refills | Status: DC

## 2016-10-27 NOTE — Telephone Encounter (Signed)
°  Relation to ZO:XWRUpt:self Call back number:[phone]   Reason for call:  Patient requesting a refill amphetamine-dextroamphetamine (ADDERALL XR) 30 MG 24 hr capsule

## 2016-10-27 NOTE — Telephone Encounter (Signed)
I left a message for the patient that her Rx was ready for pick up and that I found the controlled substance contract in our system. I asked the patient to call back with any questions.

## 2016-10-27 NOTE — Telephone Encounter (Signed)
The last refill was 09/21/16 for #30 with 0 refills. Last UDS was 08/13/16 low risk. I will have the patient come in to sign a controlled substance contract.

## 2016-10-27 NOTE — Telephone Encounter (Signed)
PCP prescribe  this medication regularly. Prescription printed ;  also, I noted she's not taken any better control pills, remind  patient she cannot take this medication if suspect pregnancy.

## 2016-10-27 NOTE — Telephone Encounter (Signed)
I spoke with the patient and she states that her CT scan showed an enlarged liver. She would like to know if there is anything she needs to be concerned about at this time.  Please advise.

## 2016-10-28 ENCOUNTER — Encounter: Payer: Self-pay | Admitting: Medical

## 2016-10-28 ENCOUNTER — Other Ambulatory Visit (HOSPITAL_COMMUNITY): Payer: Self-pay | Admitting: Internal Medicine

## 2016-10-28 ENCOUNTER — Other Ambulatory Visit: Payer: Self-pay | Admitting: Internal Medicine

## 2016-10-28 MED ORDER — CLOTRIMAZOLE 10 MG MT TROC
10.0000 mg | Freq: Four times a day (QID) | OROMUCOSAL | 0 refills | Status: DC
Start: 2016-10-28 — End: 2016-11-03

## 2016-10-28 MED ORDER — BUDESONIDE-FORMOTEROL FUMARATE 80-4.5 MCG/ACT IN AERO: 2.0000 | INHALATION_SPRAY | Freq: Two times a day (BID) | RESPIRATORY_TRACT | 0 refills | Status: DC

## 2016-10-28 MED ORDER — BUDESONIDE-FORMOTEROL FUMARATE 80-4.5 MCG/ACT IN AERO: 2.0000 | INHALATION_SPRAY | Freq: Two times a day (BID) | RESPIRATORY_TRACT | 5 refills | Status: DC

## 2016-10-28 NOTE — Telephone Encounter (Signed)
Called to triage patient.  Left a message for call back.  Also sent message via MyChart.  Awaiting to hear back from patient.

## 2016-10-28 NOTE — Telephone Encounter (Signed)
I reviewed her ct scan and it showed mild fatty liver. I did not see enlargement on report. For fat in liver decrease fat in diet, reduce process foods and try to avoid fructose. Try to keep weight under control/loose weight as well.  Pt liver function looked good. I reviewed prior labs around time ct done. Will follow enzyme repeat studies in future. May also get US of ruq/liver later as well.

## 2016-10-28 NOTE — Progress Notes (Unsigned)
Copy of Narcotic contract given to patient for signature. Patient left for Adderall XR Rx

## 2016-11-01 ENCOUNTER — Ambulatory Visit: Payer: Self-pay | Admitting: Medical

## 2016-11-03 ENCOUNTER — Encounter: Payer: Self-pay | Admitting: Gastroenterology

## 2016-11-03 ENCOUNTER — Ambulatory Visit (INDEPENDENT_AMBULATORY_CARE_PROVIDER_SITE_OTHER): Payer: Self-pay | Admitting: Gastroenterology

## 2016-11-03 VITALS — BP 130/96 | HR 84 | Ht 66.0 in | Wt 257.4 lb

## 2016-11-03 DIAGNOSIS — K625 Hemorrhage of anus and rectum: Secondary | ICD-10-CM

## 2016-11-03 DIAGNOSIS — R197 Diarrhea, unspecified: Secondary | ICD-10-CM

## 2016-11-03 DIAGNOSIS — R109 Unspecified abdominal pain: Secondary | ICD-10-CM

## 2016-11-03 MED ORDER — NA SULFATE-K SULFATE-MG SULF 17.5-3.13-1.6 GM/177ML PO SOLN: 1.0000 | Freq: Once | ORAL | 0 refills | Status: AC

## 2016-11-03 NOTE — Patient Instructions (Signed)
Start over the [employer] Florastor probiotic twice daily.   Discontinue Augmentin.   You have been scheduled for a colonoscopy. Please follow written instructions given to you at your visit today.  Please pick up your prep supplies at the pharmacy within the next 1-3 days. If you use inhalers (even only as needed), please bring them with you on the day of your procedure. Your physician has requested that you go to www.startemmi.com and enter the access code given to you at your visit today. This web site gives a general overview about your procedure. However, you should still follow specific instructions given to you by our office regarding your preparation for the procedure.

## 2016-11-19 ENCOUNTER — Encounter: Payer: Self-pay | Admitting: Gastroenterology

## 2016-11-19 DIAGNOSIS — K625 Hemorrhage of anus and rectum: Secondary | ICD-10-CM | POA: Insufficient documentation

## 2016-11-19 DIAGNOSIS — R109 Unspecified abdominal pain: Secondary | ICD-10-CM | POA: Insufficient documentation

## 2016-11-19 DIAGNOSIS — R197 Diarrhea, unspecified: Secondary | ICD-10-CM | POA: Insufficient documentation

## 2016-11-19 NOTE — Progress Notes (Signed)
11/03/2016 [name] [id number] 1981-09-04   HISTORY OF PRESENT ILLNESS:  This is a 35 year old female who is new to our practice and was referred here by Mackie Pai, PA-C, for evaluation of diarrhea, rectal bleeding, and abdominal pain.  She tells me that she was admitted to the hospital for a couple of days at the end of October for an asthma exacerbation and "possible PNA".  Received IV abx.  While she was there she reports that she started having diarrhea.  When discharged home it worsened and she had some 10-12 times one night.  Started having blood and mucus in it as well.  Diffuse abdominal pain.  Was having sweating.  Says that at one point she passed only blood clots.  She says that prior to this hospital stay she was not having any GI issues.  Stool culture, O&P, and Cdiff were negative.  She was treated empirically with a course of Flagyl, which she has completed.  Was positive for occult blood, however.  CT abdomen and pelvis with contrast unremarkable.  Had elevated WBC count but was on steroids for her pulmonary issues.  Now the diarrhea has resolved somewhat.  Bleeding lessened.  Her abdomen just still sore everywhere.    She is here today with her mother.  Patient is very pleasant but very anxious and says "I just feel like there is something wrong".  Past Medical History:  Diagnosis Date  . Asthma   . History of chicken pox   . Pneumonia   . UTI (urinary tract infection)    Past Surgical History:  Procedure Laterality Date  . CESAREAN SECTION  09/27/12    reports that she has never smoked. She has never used smokeless tobacco. She reports that she does not drink alcohol or use drugs. family history includes Allergies in her sister; Cancer in her sister; Colon cancer in her maternal grandfather; Diabetes in her father and paternal grandmother; Fibroids in her mother; Liver cancer in her paternal aunt. Allergies  Allergen Reactions  . Aspirin Hives  . Levaquin  [Levofloxacin In D5w] Hives  . Pamprin [Apap-Pamabrom-Pyrilamine] Hives      Outpatient Encounter Prescriptions as of 11/03/2016  Medication Sig  . albuterol (PROAIR HFA) 108 (90 Base) MCG/ACT inhaler Inhale 2 puffs into the lungs every 6 (six) hours as needed for wheezing.  Marland Kitchen amphetamine-dextroamphetamine (ADDERALL XR) 30 MG 24 hr capsule Take 1 capsule (30 mg total) by mouth daily.  . budesonide (PULMICORT) 0.25 MG/2ML nebulizer solution Take 2 mLs (0.25 mg total) by nebulization 2 (two) times daily.  . famotidine (PEPCID) 20 MG tablet Take 1 tablet (20 mg total) by mouth at bedtime.  . fluticasone (FLONASE) 50 MCG/ACT nasal spray Place 1 spray into both nostrils 2 (two) times daily. (Patient taking differently: Place 2 sprays into both nostrils 2 (two) times daily. )  . ipratropium (ATROVENT) 0.02 % nebulizer solution Take 2.5 mLs (0.5 mg total) by nebulization every 6 (six) hours as needed for wheezing or shortness of breath.  . levalbuterol (XOPENEX) 1.25 MG/3ML nebulizer solution Take 1.25 mg by nebulization every 4 (four) hours as needed for wheezing.  . montelukast (SINGULAIR) 10 MG tablet Take 1 tablet (10 mg total) by mouth at bedtime.  . Multiple Vitamins-Minerals (MULTIVITAMIN ADULT PO) Take 1 tablet by mouth every morning.  Marland Kitchen omeprazole (PRILOSEC) 40 MG capsule Take 1 capsule (40 mg total) by mouth daily. (Patient taking differently: Take 40 mg by mouth every  morning. )  . predniSONE (STERAPRED UNI-PAK 21 TAB) 10 MG (21) TBPK tablet Take 60 mg tablet 10/28 and taper down by 10 mg daily until completed  . sertraline (ZOLOFT) 50 MG tablet Take 1 tablet (50 mg total) by mouth daily. (Patient taking differently: Take 50 mg by mouth every morning. )  . [DISCONTINUED] budesonide-formoterol (SYMBICORT) 80-4.5 MCG/ACT inhaler Inhale 2 puffs into the lungs 2 (two) times daily.  . [DISCONTINUED] clonazePAM (KLONOPIN) 0.5 MG tablet Take 1 tablet (0.5 mg total) by mouth 2 (two) times daily as  needed for anxiety.  . [DISCONTINUED] clotrimazole (MYCELEX) 10 MG troche Take 1 tablet (10 mg total) by mouth 4 (four) times daily.  . [DISCONTINUED] diphenoxylate-atropine (LOMOTIL) 2.5-0.025 MG tablet Take 1 tablet by mouth 4 (four) times daily as needed for diarrhea or loose stools.  . [DISCONTINUED] guaiFENesin-dextromethorphan (ROBITUSSIN DM) 100-10 MG/5ML syrup Take 10 mLs by mouth every 4 (four) hours as needed for cough.  . [DISCONTINUED] HYDROcodone-acetaminophen (NORCO) 5-325 MG tablet Take 1-2 tablets by mouth every 6 (six) hours as needed.  . [DISCONTINUED] Melatonin 10 MG CAPS Take 20 mg by mouth at bedtime as needed (sleep).  . [DISCONTINUED] metroNIDAZOLE (FLAGYL) 500 MG tablet Take 1 tablet (500 mg total) by mouth 3 (three) times daily.  . [EXPIRED] Na Sulfate-K Sulfate-Mg Sulf 17.5-3.13-1.6 GM/180ML SOLN Take 1 kit by mouth once.   No facility-administered encounter medications on file as of 11/03/2016.     REVIEW OF SYSTEMS  : All other systems reviewed and negative except where noted in the History of Present Illness.  PHYSICAL EXAM: BP (!) 130/96   Pulse 84   Ht _0  (1.676 m)   Wt 257 lb 6 oz (116.7 kg)   BMI 41.54 kg/m  General: Well developed white female in no acute distress; very anxious Head: Normocephalic and atraumatic Eyes:  Sclerae anicteric, conjunctiva pink. Ears: Normal auditory acuity Lungs: Clear throughout to auscultation Heart: Regular rate and rhythm Abdomen: Soft, non-distended.  Normal bowel sounds.   Rectal:  Will be done at the time of colonoscopy. Musculoskeletal: Symmetrical with no gross deformities  Skin: No lesions on visible extremities Extremities: No edema  Neurological: Alert oriented x 4, grossly non-focal Psychological:  Alert and cooperative. Normal mood and affect  ASSESSMENT AND PLAN: -Diarrhea, rectal bleeding, diffuse abdominal pain, sweating:  This was all sudden onset that began during a recent hospital stay for  pulmonary issue at which time she received IV abx and steroids.  Stool studies were negative but sounds like it was something infectious.  ? Side effects of medications.  But amount of bleeding concerning.  No GI complaints until this occasion.  Will schedule for colonoscopy for evaluation.  The risks, benefits, and alternatives to colonoscopy were discussed with the patient and she consents to proceed.  I have asked her to begin Florastor twice daily.  CC:  Saguier, Percell Miller, PA-C

## 2016-11-21 ENCOUNTER — Telehealth: Payer: Self-pay | Admitting: Gastroenterology

## 2016-11-21 NOTE — Telephone Encounter (Signed)
On call note. Pt report fever between 102 and 103 for past 2 days. Reports she is recovering from PNA. Wants to know about proceeding with colonoscopy on Monday. Advised to cancel colonoscopy for Monday and reschedule. Advised to seek care at an urgent care or ED today. Pt expresses understanding and will follow through. Advised her there would be no charge for late cancellation.

## 2016-11-22 ENCOUNTER — Telehealth: Payer: Self-pay | Admitting: Gastroenterology

## 2016-11-22 ENCOUNTER — Encounter: Payer: Self-pay | Admitting: Gastroenterology

## 2016-11-22 MED ORDER — NA SULFATE-K SULFATE-MG SULF 17.5-3.13-1.6 GM/177ML PO SOLN: 1.0000 | Freq: Once | ORAL | 0 refills | Status: AC

## 2016-11-22 NOTE — Telephone Encounter (Signed)
Prep sent to [facility] today   Left Message for patient to pick up prep kit today from pharmacy

## 2016-11-22 NOTE — Progress Notes (Signed)
Reviewed and agree with documentation and assessment and plan. K. Veena Nilsa Macht , MD   

## 2016-11-22 NOTE — Progress Notes (Signed)
If persistent symptoms , need to check stool C.diff, given recent history of antibiotics

## 2016-11-22 NOTE — Telephone Encounter (Signed)
Ok thank you 

## 2016-12-08 ENCOUNTER — Encounter: Payer: Self-pay | Admitting: Gastroenterology

## 2016-12-08 ENCOUNTER — Ambulatory Visit (AMBULATORY_SURGERY_CENTER): Payer: Medicaid Other | Admitting: Gastroenterology

## 2016-12-08 VITALS — BP 124/74 | HR 77 | Temp 98.4°F | Resp 20 | Ht 66.0 in | Wt 257.0 lb

## 2016-12-08 DIAGNOSIS — R197 Diarrhea, unspecified: Secondary | ICD-10-CM

## 2016-12-08 MED ORDER — SODIUM CHLORIDE 0.9 % IV SOLN: 500.0000 mL | INTRAVENOUS | Status: DC

## 2016-12-08 NOTE — Progress Notes (Signed)
Called to room to assist during endoscopic procedure.  Patient ID and intended procedure confirmed with present staff. Received instructions for my participation in the procedure from the performing physician.  

## 2016-12-08 NOTE — Patient Instructions (Signed)
YOU HAD AN ENDOSCOPIC PROCEDURE TODAY AT THE [facility]:   Refer to the procedure report that was given to you for any specific questions about what was found during the examination.  If the procedure report does not answer your questions, please call your gastroenterologist to clarify.  If you requested that your care partner not be given the details of your procedure findings, then the procedure report has been included in a sealed envelope for you to review at your convenience later.  YOU SHOULD EXPECT: Some feelings of bloating in the abdomen. Passage of more gas than usual.  Walking can help get rid of the air that was put into your GI tract during the procedure and reduce the bloating. If you had a lower endoscopy (such as a colonoscopy or flexible sigmoidoscopy) you may notice spotting of blood in your stool or on the toilet paper. If you underwent a bowel prep for your procedure, you may not have a normal bowel movement for a few days.  Please Note:  You might notice some irritation and congestion in your nose or some drainage.  This is from the oxygen used during your procedure.  There is no need for concern and it should clear up in a day or so.  SYMPTOMS TO REPORT IMMEDIATELY:   Following lower endoscopy (colonoscopy or flexible sigmoidoscopy):  Excessive amounts of blood in the stool  Significant tenderness or worsening of abdominal pains  Swelling of the abdomen that is new, acute  Fever of 100F or higher  For urgent or emergent issues, a gastroenterologist can be reached at any hour by calling [phone].   DIET:  We do recommend a small meal at first, but then you may proceed to your regular diet.  Drink plenty of fluids but you should avoid alcoholic beverages for 24 hours.  ACTIVITY:  You should plan to take it easy for the rest of today and you should NOT DRIVE or use heavy machinery until tomorrow (because of the sedation medicines used during the test).     FOLLOW UP: Our staff will call the number listed on your records the next business day following your procedure to check on you and address any questions or concerns that you may have regarding the information given to you following your procedure. If we do not reach you, we will leave a message.  However, if you are feeling well and you are not experiencing any problems, there is no need to return our call.  We will assume that you have returned to your regular daily activities without incident.  If any biopsies were taken you will be contacted by phone or by letter within the next 1-3 weeks.  Please call us at [phone] if you have not heard about the biopsies in 3 weeks.     SIGNATURES/CONFIDENTIALITY: You and/or your care partner have signed paperwork which will be entered into your electronic medical record.  These signatures attest to the fact that that the information above on your After Visit Summary has been reviewed and is understood.  Full responsibility of the confidentiality of this discharge information lies with you and/or your care-partner.   Hemorrhoids (handout given) Await for biopsy result

## 2016-12-08 NOTE — Progress Notes (Signed)
A/ox3 pleased with MAC, report to Michelle RN 

## 2016-12-08 NOTE — Op Note (Signed)
[facility] Patient Name: [name] Procedure Date: 12/08/2016 8:08 AM MRN: [id number] Endoscopist: Napoleon FormKavitha V. Aundra Espin , MD Age: 35 Referring MD:  Date of Birth: 1981/05/20 Gender: Female Account #: [id number] Procedure:                Colonoscopy Indications:              Clinically significant diarrhea of unexplained                            origin, This is the patient's first colonoscopy Medicines:                Monitored Anesthesia Care Procedure:                Pre-Anesthesia Assessment:                           - Prior to the procedure, a History and Physical                            was performed, and patient medications and                            allergies were reviewed. The patient's tolerance of                            previous anesthesia was also reviewed. The risks                            and benefits of the procedure and the sedation                            options and risks were discussed with the patient.                            All questions were answered, and informed consent                            was obtained. Prior Anticoagulants: The patient has                            taken no previous anticoagulant or antiplatelet                            agents. ASA Grade Assessment: II - A patient with                            mild systemic disease. After reviewing the risks                            and benefits, the patient was deemed in                            satisfactory condition to undergo the procedure.  After obtaining informed consent, the colonoscope                            was passed under direct vision. Throughout the                            procedure, the patient's blood pressure, pulse, and                            oxygen saturations were monitored continuously. The                            Model CF-HQ190L [id number]) scope was introduced                            through  the anus and advanced to the the terminal                            ileum, with identification of the appendiceal                            orifice and IC valve. The colonoscopy was performed                            without difficulty. The patient tolerated the                            procedure well. The quality of the bowel                            preparation was excellent. The terminal ileum,                            ileocecal valve, appendiceal orifice, and rectum                            were photographed. Scope In: 8:17:28 AM Scope Out: 8:34:45 AM Scope Withdrawal Time: 0 hours 13 minutes 39 seconds  Total Procedure Duration: 0 hours 17 minutes 17 seconds  Findings:                 The perianal and digital rectal examinations were                            normal.                           The colon (entire examined portion) appeared                            normal. Biopsies for histology were taken with a                            cold forceps from the entire colon for evaluation  of microscopic colitis.                           The terminal ileum appeared normal. Biopsies were                            taken with a cold forceps for histology.                           Non-bleeding internal hemorrhoids were found during                            retroflexion. The hemorrhoids were small.                           The exam was otherwise without abnormality. Complications:            No immediate complications. Estimated Blood Loss:     Estimated blood loss was minimal. Impression:               - The entire examined colon is normal. Biopsied.                           - The examined portion of the ileum was normal.                            Biopsied.                           - Non-bleeding internal hemorrhoids.                           - The examination was otherwise normal. Recommendation:           - Patient has a contact number  available for                            emergencies. The signs and symptoms of potential                            delayed complications were discussed with the                            patient. Return to normal activities tomorrow.                            Written discharge instructions were provided to the                            patient.                           - Resume previous diet.                           - Continue present medications.                           -  Await pathology results.                           - Repeat colonoscopy at age 38 for screening                            purposes.                           - Return to GI clinic PRN. Napoleon Form, MD 12/08/2016 8:41:18 AM This report has been signed electronically.

## 2016-12-09 ENCOUNTER — Telehealth: Payer: Self-pay

## 2016-12-09 NOTE — Telephone Encounter (Signed)
  Follow up Call-  Call back number 12/08/2016  Post procedure Call Back phone  # [phone]  Permission to leave phone message Yes  Some recent data might be hidden     Patient questions:  Do you have a fever, pain , or abdominal swelling? No. Pain Score  0 *  Have you tolerated food without any problems? Yes.    Have you been able to return to your normal activities? Yes.    Do you have any questions about your discharge instructions: Diet   No. Medications  No. Follow up visit  No.  Do you have questions or concerns about your Care? No.  Actions: * If pain score is 4 or above: No action needed, pain <4.

## 2016-12-29 ENCOUNTER — Encounter: Payer: Self-pay | Admitting: Gastroenterology

## 2017-01-03 ENCOUNTER — Telehealth: Payer: Self-pay | Admitting: Medical

## 2017-01-03 NOTE — Telephone Encounter (Signed)
Refill request for Adderall XR 30mg  Last filled by MD on - 10/27/16 30-day by Dr. Paz] Last AEX - 10/22/16 [Sent to ER for Abdominal pain & Hematochezia] Next AEX - Patient was to return for F/U OV after ED visit; patient was sent to Gastroenterology for continued Abdominal pain & Diarrhea [had Endoscopy 12/08/16] Patient has no F/U visit scheduled with PCP; Please Advise on refills/SLS 01/15

## 2017-01-03 NOTE — Telephone Encounter (Signed)
Self.   Refill request on Adderall   CB: [phone]

## 2017-01-04 ENCOUNTER — Ambulatory Visit (INDEPENDENT_AMBULATORY_CARE_PROVIDER_SITE_OTHER): Payer: Medicaid Other | Admitting: Medical

## 2017-01-04 ENCOUNTER — Encounter: Payer: Self-pay | Admitting: Medical

## 2017-01-04 ENCOUNTER — Ambulatory Visit (HOSPITAL_BASED_OUTPATIENT_CLINIC_OR_DEPARTMENT_OTHER)
Admission: RE | Admit: 2017-01-04 | Discharge: 2017-01-04 | Disposition: A | Discharge status: Home / Self Care | Payer: Medicaid Other | Source: Ambulatory Visit | Attending: Medical | Admitting: Medical

## 2017-01-04 VITALS — BP 112/60 | HR 103 | Temp 98.0°F | Resp 20 | Ht 66.0 in | Wt 256.0 lb

## 2017-01-04 DIAGNOSIS — R05 Cough: Secondary | ICD-10-CM | POA: Diagnosis present

## 2017-01-04 DIAGNOSIS — R0989 Other specified symptoms and signs involving the circulatory and respiratory systems: Secondary | ICD-10-CM | POA: Insufficient documentation

## 2017-01-04 DIAGNOSIS — R062 Wheezing: Secondary | ICD-10-CM | POA: Insufficient documentation

## 2017-01-04 DIAGNOSIS — J4541 Moderate persistent asthma with (acute) exacerbation: Secondary | ICD-10-CM

## 2017-01-04 DIAGNOSIS — J209 Acute bronchitis, unspecified: Secondary | ICD-10-CM

## 2017-01-04 DIAGNOSIS — R6889 Other general symptoms and signs: Secondary | ICD-10-CM | POA: Diagnosis not present

## 2017-01-04 LAB — POCT RAPID STREP A (OFFICE): Rapid Strep A Screen: NEGATIVE

## 2017-01-04 LAB — POCT INFLUENZA A: Rapid Influenza A Ag: NEGATIVE

## 2017-01-04 MED ORDER — FAMOTIDINE 20 MG PO TABS: 20.0000 mg | ORAL_TABLET | Freq: Every day | ORAL | 11 refills | Status: DC

## 2017-01-04 MED ORDER — FLUTICASONE PROPIONATE 50 MCG/ACT NA SUSP: 2.0000 | Freq: Every day | NASAL | 2 refills | Status: DC

## 2017-01-04 MED ORDER — ALBUTEROL SULFATE HFA 108 (90 BASE) MCG/ACT IN AERS: 2.0000 | INHALATION_SPRAY | Freq: Four times a day (QID) | RESPIRATORY_TRACT | 1 refills | Status: DC | PRN

## 2017-01-04 MED ORDER — SYMBICORT 80-4.5 MCG/ACT IN AERO: 2.0000 | INHALATION_SPRAY | Freq: Two times a day (BID) | RESPIRATORY_TRACT | 0 refills | Status: DC

## 2017-01-04 MED ORDER — LEVALBUTEROL HCL 1.25 MG/3ML IN NEBU: 1.2500 mg | INHALATION_SOLUTION | RESPIRATORY_TRACT | 1 refills | Status: DC | PRN

## 2017-01-04 MED ORDER — AZITHROMYCIN 250 MG PO TABS: ORAL_TABLET | ORAL | 0 refills | Status: DC

## 2017-01-04 MED ORDER — OMEPRAZOLE 40 MG PO CPDR: 40.0000 mg | DELAYED_RELEASE_CAPSULE | Freq: Every day | ORAL | 11 refills | Status: DC

## 2017-01-04 MED ORDER — PREDNISONE 10 MG PO TABS: ORAL_TABLET | ORAL | 0 refills | Status: DC

## 2017-01-04 MED ORDER — MONTELUKAST SODIUM 10 MG PO TABS: 10.0000 mg | ORAL_TABLET | Freq: Every day | ORAL | 1 refills | Status: DC

## 2017-01-04 MED ORDER — SERTRALINE HCL 50 MG PO TABS: 50.0000 mg | ORAL_TABLET | Freq: Every day | ORAL | 1 refills | Status: DC

## 2017-01-04 MED ORDER — AMPHETAMINE-DEXTROAMPHET ER 30 MG PO CP24: 30.0000 mg | ORAL_CAPSULE | Freq: Every day | ORAL | 0 refills | Status: DC

## 2017-01-04 MED ORDER — METHYLPREDNISOLONE ACETATE 40 MG/ML IJ SUSP
40.0000 mg | Freq: Once | INTRAMUSCULAR | Status: AC
  Administered 2017-01-04: 40 mg via INTRAMUSCULAR

## 2017-01-04 NOTE — Progress Notes (Signed)
Pt has negative flu and negative rapid strep test.

## 2017-01-04 NOTE — Patient Instructions (Addendum)
Recurrent bronchitis with asthma flares.   Since so many flares  in short interval I put in referral to pulmonologist so provider in office can see you asap.  Will get cxr today then decide on which antibiotic will give. (post xray decided to give azithromycin antibiotic)  Will rx taper dose prednisone for 5 days. Give depomedrol 40 mg im today.  We put in urgent referral so hopefully will get answer back today or tomorrow.  Continue symbicort and albuterol  Follow up in 7 days or as needed.  I want you to call Victorino DikeJennifer on Thursday am for up date on urgent pulmonologist referral.

## 2017-01-04 NOTE — Progress Notes (Signed)
Subjective:    Patient ID: [name], female    DOB: 26-Aug-1981, 36 y.o.   MRN: [id number]  HPI  Pt in for follow up. Pt was admitted in October for pneumonia. Pt has been going to various urgent cares since last visit and received antibiotics. She states 2 weeks ago pt was given doxycycline antibiotic, and gave steroid injection and oral prednisone.   Pt updates me that since October has gotten steroid and antibiotic overall about 5 times.  Pt has not seen Dr. Sherene Sires recently. He is her pulmonologist. Appointment is for June 23, 2017 and she is on cancellation list.    Pt is on Symbicort 2 inh po bid and albuterol. Pt needs prior authorization on both Pulmicort and Atrovent neb solution.  MP- iud mirena.  Pt was seen yesterday at [facility].   They thought mild persistent asthma excacerbation and told her to follow up in on week or sooner. So she came in today. [facility] did not treat her.     Review of Systems  Constitutional: Negative for chills and fatigue.  HENT: Negative for congestion, ear pain, sinus pain, sinus pressure, sneezing and sore throat.   Respiratory: Positive for cough and wheezing. Negative for shortness of breath.        Cough is productive.  Cardiovascular: Negative for chest pain and palpitations.  Gastrointestinal: Negative for abdominal pain.  Musculoskeletal: Negative for back pain and myalgias.  Skin: Negative for rash.  Hematological: Negative for adenopathy. Does not bruise/bleed easily.  Psychiatric/Behavioral: Negative for behavioral problems and confusion.    Past Medical History:  Diagnosis Date  . Asthma   . History of chicken pox   . Pneumonia   . UTI (urinary tract infection)      Social History   Social History  . Marital status: Divorced    Spouse name: N/A  . Number of children: 1  . Years of education: N/A   Occupational History  . Stay at home [employer]   Social History Main Topics  . Smoking  status: Never Smoker  . Smokeless tobacco: Never Used  . Alcohol use No  . Drug use: No  . Sexual activity: Yes    Birth control/ protection: IUD   Other Topics Concern  . Not on file   Social History Narrative  . No narrative on file    Past Surgical History:  Procedure Laterality Date  . CESAREAN SECTION  09/27/12    Family History  Problem Relation Age of Onset  . Allergies Sister   . Cancer Sister     fallopian tube  . Fibroids Mother     Malignant  . Diabetes Father   . Colon cancer Maternal Grandfather   . Diabetes Paternal Grandmother   . Liver cancer Paternal Aunt   . Colon polyps Neg Hx   . Esophageal cancer Neg Hx   . Stomach cancer Neg Hx   . Rectal cancer Neg Hx     Allergies  Allergen Reactions  . Aspirin Hives  . Levaquin [Levofloxacin In D5w] Hives  . Pamprin [Apap-Pamabrom-Pyrilamine] Hives    Current Outpatient Prescriptions on File Prior to Visit  Medication Sig Dispense Refill  . albuterol (PROAIR HFA) 108 (90 Base) MCG/ACT inhaler Inhale 2 puffs into the lungs every 6 (six) hours as needed for wheezing. 1 Inhaler 1  . amphetamine-dextroamphetamine (ADDERALL XR) 30 MG 24 hr capsule Take 1 capsule (30 mg total) by mouth daily. 30 capsule  0  . famotidine (PEPCID) 20 MG tablet Take 1 tablet (20 mg total) by mouth at bedtime. 30 tablet 11  . fluticasone (FLONASE) 50 MCG/ACT nasal spray Place 1 spray into both nostrils 2 (two) times daily. (Patient taking differently: Place 2 sprays into both nostrils 2 (two) times daily. ) 16 g 5  . levalbuterol (XOPENEX) 1.25 MG/3ML nebulizer solution Take 1.25 mg by nebulization every 4 (four) hours as needed for wheezing. 500 mL 1  . montelukast (SINGULAIR) 10 MG tablet Take 1 tablet (10 mg total) by mouth at bedtime. 30 tablet 1  . Multiple Vitamins-Minerals (MULTIVITAMIN ADULT PO) Take 1 tablet by mouth every morning.    Marland Kitchen omeprazole (PRILOSEC) 40 MG capsule Take 1 capsule (40 mg total) by mouth daily. (Patient  taking differently: Take 40 mg by mouth every morning. ) 30 capsule 11  . sertraline (ZOLOFT) 50 MG tablet Take 1 tablet (50 mg total) by mouth daily. (Patient taking differently: Take 50 mg by mouth every morning. ) 30 tablet 1  . SYMBICORT 80-4.5 MCG/ACT inhaler Inhale 2 puffs into the lungs 2 (two) times daily.  0  . budesonide (PULMICORT) 0.25 MG/2ML nebulizer solution Take 2 mLs (0.25 mg total) by nebulization 2 (two) times daily. (Patient not taking: Reported on 01/04/2017) 60 mL 12  . ipratropium (ATROVENT) 0.02 % nebulizer solution Take 2.5 mLs (0.5 mg total) by nebulization every 6 (six) hours as needed for wheezing or shortness of breath. (Patient not taking: Reported on 01/04/2017) 75 mL 1   Current Facility-Administered Medications on File Prior to Visit  Medication Dose Route Frequency Provider Last Rate Last Dose  . 0.9 %  sodium chloride infusion  500 mL Intravenous Continuous Napoleon Form, MD        BP 112/60 (BP Location: Left Arm, Patient Position: Sitting, Cuff Size: Large)   Pulse (!) 103   Temp 98 F (36.7 C) (Oral)   Resp 20   Ht 5\' 6"  (1.676 m)   Wt 256 lb (116.1 kg)   SpO2 95%   BMI 41.32 kg/m       Objective:   Physical Exam  General  Mental Status - Alert. General Appearance - Well groomed. Not in acute distress.  Skin Rashes- No Rashes.  HEENT Head- Normal. Ear Auditory Canal - Left- Normal. Right - Normal.Tympanic Membrane- Left- Normal. Right- Normal. Eye Sclera/Conjunctiva- Left- Normal. Right- Normal. Nose & Sinuses Nasal Mucosa- Left-  Boggy and Congested. Right-  Boggy and  Congested.Bilateral maxillary and frontal sinus pressure. Mouth & Throat Lips: Upper Lip- Normal: no dryness, cracking, pallor, cyanosis, or vesicular eruption. Lower Lip-Normal: no dryness, cracking, pallor, cyanosis or vesicular eruption. Buccal Mucosa- Bilateral- No Aphthous ulcers. Oropharynx- No Discharge or Erythema. Tonsils: Characteristics- Bilateral- No  Erythema or Congestion. Size/Enlargement- Bilateral- No enlargement. Discharge- bilateral-None.  Neck Neck- Supple. No Masses.   Chest and Lung Exam Auscultation: Breath Sounds:-even and unlabored. Scattered mild wheezing.  Cardiovascular Auscultation:Rythm- Regular, rate and rhythm. Murmurs & Other Heart Sounds:Ausculatation of the heart reveal- No Murmurs.  Lymphatic Head & Neck General Head & Neck Lymphatics: Bilateral: Description- No Localized lymphadenopathy.       Assessment & Plan:  Recurrent bronchitis with asthma flares.   Since so many flares  in short interval I put in referral to pulmonologist so provider in office can see you asap.  Will get cxr today then decide on which antibiotic will give. (post xray decided to give azithromycin antibiotic)  Will rx taper dose  prednisone for 5 days. Give depomedrol 40 mg im today.  We put in urgent referral so hopefully will get answer back today or tomorrow.  Continue symbicort and albuterol  Follow up in 7 days or as needed.  I want you to call Victorino DikeJennifer on Thursday am for up date on urgent pulmonologist referral.  Esperanza RichtersSaguier, Jamarkis Branam, PA-C

## 2017-01-04 NOTE — Progress Notes (Signed)
Pre visit review using our clinic review tool, if applicable. No additional management support is needed unless otherwise documented below in the visit note/SLS  

## 2017-01-05 LAB — CULTURE, GROUP A STREP: Organism ID, Bacteria: NORMAL

## 2017-01-07 ENCOUNTER — Ambulatory Visit: Payer: Self-pay | Admitting: Internal Medicine

## 2017-01-11 ENCOUNTER — Telehealth: Payer: Self-pay | Admitting: *Deleted

## 2017-01-11 ENCOUNTER — Ambulatory Visit: Payer: Self-pay | Admitting: Medical

## 2017-01-11 NOTE — Telephone Encounter (Signed)
Spoke with Wynona Can[facility]ristine at [facility], [phone] re: PA for levalbuterol and symbicort. Call ID# [id number]. Symbicort approved x 1 year, #[id number].  Levalbuterol will have to go for review and can check status in 24 hours, [id number]. Notified Meredith at [facility] pharmacy and left detailed message on pt's voicemail re: PA status. Awaiting determination for levalbuterol.

## 2017-01-12 NOTE — Telephone Encounter (Signed)
Spoke with Rosanne AshingJim at [facility] and received approval for levalbuterol nebulizer solution good through 01/07/2018. Approval # [id number].

## 2017-01-12 NOTE — Telephone Encounter (Signed)
Notified Agricultural engineerBriana at [facility] pharmacy.

## 2017-01-13 ENCOUNTER — Ambulatory Visit: Payer: Self-pay | Admitting: Internal Medicine

## 2017-01-13 ENCOUNTER — Ambulatory Visit: Payer: Self-pay | Admitting: Medical

## 2017-01-14 ENCOUNTER — Ambulatory Visit (INDEPENDENT_AMBULATORY_CARE_PROVIDER_SITE_OTHER): Payer: Medicaid Other | Admitting: Medical

## 2017-01-14 VITALS — BP 114/55 | HR 107 | Temp 98.4°F | Ht 66.0 in | Wt 257.4 lb

## 2017-01-14 DIAGNOSIS — J45909 Unspecified asthma, uncomplicated: Secondary | ICD-10-CM

## 2017-01-14 MED ORDER — PREDNISONE 10 MG PO TABS: ORAL_TABLET | ORAL | 0 refills | Status: DC

## 2017-01-14 MED ORDER — METHYLPREDNISOLONE ACETATE 40 MG/ML IJ SUSP
40.0000 mg | Freq: Once | INTRAMUSCULAR | Status: AC
  Administered 2017-01-14: 40 mg via INTRAMUSCULAR

## 2017-01-14 NOTE — Patient Instructions (Signed)
You have had rapid flare of your asthma recently again. I don't think any infection component presently. Will give depomedrol 40 mg im. Rx brief taper prednisone. Get symbicort today/start and use daily. Use albuterol if needed as well.  Follow up with pulmonologist on Wednesday.  If over the weekend signs and symptoms change or worsen then ED evaluation.  Follow up in 7 days or as needed

## 2017-01-14 NOTE — Addendum Note (Signed)
Addended by: Regis BillSCATES, Aysel Gilchrest L on: 01/14/2017 05:01 PM   Modules accepted: Orders
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
## 2017-01-14 NOTE — Progress Notes (Signed)
left bundle branch block   

## 2017-02-02 ENCOUNTER — Encounter: Payer: Self-pay | Admitting: Medical

## 2017-02-02 ENCOUNTER — Ambulatory Visit (INDEPENDENT_AMBULATORY_CARE_PROVIDER_SITE_OTHER): Payer: Medicaid Other | Admitting: Medical

## 2017-02-02 VITALS — BP 111/69 | HR 91 | Temp 97.8°F | Resp 16 | Ht 66.0 in | Wt 262.2 lb

## 2017-02-02 DIAGNOSIS — J0100 Acute maxillary sinusitis, unspecified: Secondary | ICD-10-CM

## 2017-02-02 DIAGNOSIS — F909 Attention-deficit hyperactivity disorder, unspecified type: Secondary | ICD-10-CM

## 2017-02-02 DIAGNOSIS — H6690 Otitis media, unspecified, unspecified ear: Secondary | ICD-10-CM

## 2017-02-02 MED ORDER — AMOXICILLIN-POT CLAVULANATE 875-125 MG PO TABS: 1.0000 | ORAL_TABLET | Freq: Two times a day (BID) | ORAL | 0 refills | Status: DC

## 2017-02-02 MED ORDER — BENZONATATE 100 MG PO CAPS: 100.0000 mg | ORAL_CAPSULE | Freq: Three times a day (TID) | ORAL | 0 refills | Status: DC | PRN

## 2017-02-02 MED ORDER — AMPHETAMINE-DEXTROAMPHET ER 20 MG PO CP24: ORAL_CAPSULE | ORAL | 0 refills | Status: DC

## 2017-02-02 MED ORDER — FLUTICASONE PROPIONATE 50 MCG/ACT NA SUSP: 2.0000 | Freq: Every day | NASAL | 2 refills | Status: DC

## 2017-02-02 NOTE — Patient Instructions (Addendum)
You appear to have a sinus infection with left ear infection. I am prescribing augmentin antibiotic for the infection. To help with the nasal congestion I prescribed nasal steroid flonase. For your associated cough, I prescribed cough medicine benzonatate.  For your ADD recently not well controlled will increase the dose of adderall.   Rest, hydrate, tylenol for fever.  Follow up in 7 days or as needed.

## 2017-02-02 NOTE — Progress Notes (Signed)
Subjective:    Patient ID: [name], female    DOB: June 19, 1981, 36 y.o.   MRN: [id number]  HPI  Pt in today reporting cough, sinus pressure  nasal congestion and runny nose for   2 days. Some ear pressure as well.  Associated symptoms( below yes or no)  Fever-low grade yesterday. Chills-no Chest congestion-no Sneezing- no Itching eyes-no Sore throat- no Post-nasal drainage-yes Wheezing-no Colored drainage from her nose. Fatigue-no Myalgias- none.  Pt has ADD history. She feels like medication is not working as well as his has in the past 2 months. Pt had ADD in past as child. He mom never wanted her to be on med. I was the original prescriber. Pt has no reported side effects.    Review of Systems  Constitutional: Positive for fever. Negative for chills and fatigue.  HENT: Positive for congestion, ear pain, postnasal drip, sinus pain and sinus pressure. Negative for sneezing and trouble swallowing.   Respiratory: Negative for cough, chest tightness, shortness of breath and wheezing.   Cardiovascular: Negative for chest pain and palpitations.  Gastrointestinal: Negative for abdominal pain.  Musculoskeletal: Negative for back pain.  Skin: Negative for rash.  Neurological: Negative for dizziness, speech difficulty, weakness and headaches.  Hematological: Negative for adenopathy. Does not bruise/bleed easily.  Psychiatric/Behavioral: Positive for decreased concentration. Negative for behavioral problems, confusion, dysphoric mood, sleep disturbance and suicidal ideas. The patient is not nervous/anxious.     Past Medical History:  Diagnosis Date  . Asthma   . History of chicken pox   . Pneumonia   . UTI (urinary tract infection)      Social History   Social History  . Marital status: Divorced    Spouse name: N/A  . Number of children: 1  . Years of education: N/A   Occupational History  . Stay at home [employer]   Social History Main  Topics  . Smoking status: Never Smoker  . Smokeless tobacco: Never Used  . Alcohol use No  . Drug use: No  . Sexual activity: Yes    Birth control/ protection: IUD   Other Topics Concern  . Not on file   Social History Narrative  . No narrative on file    Past Surgical History:  Procedure Laterality Date  . CESAREAN SECTION  09/27/12    Family History  Problem Relation Age of Onset  . Allergies Sister   . Cancer Sister     fallopian tube  . Fibroids Mother     Malignant  . Diabetes Father   . Colon cancer Maternal Grandfather   . Diabetes Paternal Grandmother   . Liver cancer Paternal Aunt   . Colon polyps Neg Hx   . Esophageal cancer Neg Hx   . Stomach cancer Neg Hx   . Rectal cancer Neg Hx     Allergies  Allergen Reactions  . Aspirin Hives  . Levaquin [Levofloxacin In D5w] Hives  . Pamprin [Apap-Pamabrom-Pyrilamine] Hives    Current Outpatient Prescriptions on File Prior to Visit  Medication Sig Dispense Refill  . albuterol (PROAIR HFA) 108 (90 Base) MCG/ACT inhaler Inhale 2 puffs into the lungs every 6 (six) hours as needed for wheezing. 1 Inhaler 1  . amphetamine-dextroamphetamine (ADDERALL XR) 30 MG 24 hr capsule Take 1 capsule (30 mg total) by mouth daily. 30 capsule 0  . famotidine (PEPCID) 20 MG tablet Take 1 tablet (20 mg total) by mouth at bedtime. 30 tablet 11  .  fluticasone (FLONASE) 50 MCG/ACT nasal spray Place 2 sprays into both nostrils daily. 16 g 2  . levalbuterol (XOPENEX) 1.25 MG/3ML nebulizer solution Take 1.25 mg by nebulization every 4 (four) hours as needed for wheezing. 500 mL 1  . montelukast (SINGULAIR) 10 MG tablet Take 1 tablet (10 mg total) by mouth at bedtime. 30 tablet 1  . Multiple Vitamins-Minerals (MULTIVITAMIN ADULT PO) Take 1 tablet by mouth every morning.    Marland Kitchen. omeprazole (PRILOSEC) 40 MG capsule Take 1 capsule (40 mg total) by mouth daily. 30 capsule 11  . sertraline (ZOLOFT) 50 MG tablet Take 1 tablet (50 mg total) by mouth  daily. 30 tablet 1  . SYMBICORT 80-4.5 MCG/ACT inhaler Inhale 2 puffs into the lungs 2 (two) times daily. 1 Inhaler 0  . budesonide (PULMICORT) 0.25 MG/2ML nebulizer solution Take 2 mLs (0.25 mg total) by nebulization 2 (two) times daily. (Patient not taking: Reported on 02/02/2017) 60 mL 12  . ipratropium (ATROVENT) 0.02 % nebulizer solution Take 2.5 mLs (0.5 mg total) by nebulization every 6 (six) hours as needed for wheezing or shortness of breath. (Patient not taking: Reported on 02/02/2017) 75 mL 1   Current Facility-Administered Medications on File Prior to Visit  Medication Dose Route Frequency Provider Last Rate Last Dose  . 0.9 %  sodium chloride infusion  500 mL Intravenous Continuous Kavitha Nandigam V, MD        BP 111/69 (BP Location: Left Arm, Patient Position: Sitting, Cuff Size: Large)   Pulse 91   Temp 97.8 F (36.6 C) (Oral)   Resp 16   Ht 5\' 6"  (1.676 m)   Wt 262 lb 4 oz (119 kg)   SpO2 97%   BMI 42.33 kg/m       Objective:   Physical Exam  General  Mental Status - Alert. General Appearance - Well groomed. Not in acute distress.  Skin Rashes- No Rashes.  HEENT Head- Normal. Ear Auditory Canal - Left- Normal. Right - Normal.Tympanic Membrane- Left- Red.  Right- Normal. Eye Sclera/Conjunctiva- Left- Normal. Right- Normal. Nose & Sinuses Nasal Mucosa- Left-  Boggy and Congested. Right-  Boggy and  Congested.Bilateral maxillary and frontal sinus pressure. Mouth & Throat Lips: Upper Lip- Normal: no dryness, cracking, pallor, cyanosis, or vesicular eruption. Lower Lip-Normal: no dryness, cracking, pallor, cyanosis or vesicular eruption. Buccal Mucosa- Bilateral- No Aphthous ulcers. Oropharynx- No Discharge or Erythema. Tonsils: Characteristics- Bilateral- No Erythema or Congestion. Size/Enlargement- Bilateral- No enlargement. Discharge- bilateral-None.  Neck Neck- Supple. No Masses.   Chest and Lung Exam Auscultation: Breath Sounds:-Clear even and  unlabored.  Cardiovascular Auscultation:Rythm- Regular, rate and rhythm. Murmurs & Other Heart Sounds:Ausculatation of the heart reveal- No Murmurs.  Lymphatic Head & Neck General Head & Neck Lymphatics: Bilateral: Description- No Localized lymphadenopathy.    Neurologic Cranial Nerve exam:- CN III-XII intact(No nystagmus), symmetric smile. Strength:- 5/5 equal and symmetric strength both upper and lower extremities.      Assessment & Plan:  You appear to have a sinus infection with left ear infection. I am prescribing augmentin antibiotic for the infection. To help with the nasal congestion I prescribed nasal steroid flonase. For your associated cough, I prescribed cough medicine benzonatate.  For your ADD recently not well controlled will increase the dose of adderall.   Rest, hydrate, tylenol for fever.  Follow up in 7 days or as needed.

## 2017-02-02 NOTE — Progress Notes (Signed)
Pre visit review using our clinic review tool, if applicable. No additional management support is needed unless otherwise documented below in the visit note/SLS  

## 2017-02-09 ENCOUNTER — Other Ambulatory Visit: Payer: Self-pay | Admitting: Medical

## 2017-02-11 ENCOUNTER — Encounter: Payer: Self-pay | Admitting: Medical

## 2017-02-11 DIAGNOSIS — Z0000 Encounter for general adult medical examination without abnormal findings: Secondary | ICD-10-CM

## 2017-02-14 NOTE — Telephone Encounter (Signed)
Please Advise

## 2017-03-02 ENCOUNTER — Ambulatory Visit (INDEPENDENT_AMBULATORY_CARE_PROVIDER_SITE_OTHER): Payer: Medicaid Other | Admitting: Medical

## 2017-03-02 ENCOUNTER — Encounter: Payer: Self-pay | Admitting: Medical

## 2017-03-02 VITALS — BP 125/71 | HR 97 | Temp 98.2°F | Resp 16 | Ht 66.0 in | Wt 260.0 lb

## 2017-03-02 DIAGNOSIS — Z111 Encounter for screening for respiratory tuberculosis: Secondary | ICD-10-CM | POA: Diagnosis not present

## 2017-03-02 DIAGNOSIS — Z0000 Encounter for general adult medical examination without abnormal findings: Secondary | ICD-10-CM | POA: Diagnosis not present

## 2017-03-02 DIAGNOSIS — Z124 Encounter for screening for malignant neoplasm of cervix: Secondary | ICD-10-CM | POA: Diagnosis not present

## 2017-03-02 DIAGNOSIS — Z113 Encounter for screening for infections with a predominantly sexual mode of transmission: Secondary | ICD-10-CM | POA: Diagnosis not present

## 2017-03-02 LAB — CBC WITH DIFFERENTIAL/PLATELET
Basophils Absolute: 0.1 10*3/uL (ref 0.0–0.1)
Basophils Relative: 0.7 % (ref 0.0–3.0)
EOS PCT: 3.6 % (ref 0.0–5.0)
Eosinophils Absolute: 0.4 10*3/uL (ref 0.0–0.7)
HCT: 41.7 % (ref 36.0–46.0)
Hemoglobin: 14 g/dL (ref 12.0–15.0)
Lymphocytes Relative: 18.5 % (ref 12.0–46.0)
Lymphs Abs: 1.8 10*3/uL (ref 0.7–4.0)
MCHC: 33.5 g/dL (ref 30.0–36.0)
MCV: 90.2 fl (ref 78.0–100.0)
Monocytes Absolute: 0.9 10*3/uL (ref 0.1–1.0)
Monocytes Relative: 9.7 % (ref 3.0–12.0)
NEUTROS ABS: 6.5 10*3/uL (ref 1.4–7.7)
NEUTROS PCT: 67.5 % (ref 43.0–77.0)
PLATELETS: 294 10*3/uL (ref 150.0–400.0)
RBC: 4.63 Mil/uL (ref 3.87–5.11)
RDW: 13.3 % (ref 11.5–15.5)
WBC: 9.6 10*3/uL (ref 4.0–10.5)

## 2017-03-02 LAB — COMPREHENSIVE METABOLIC PANEL
ALT: 25 U/L (ref 0–35)
AST: 27 U/L (ref 0–37)
Albumin: 4.2 g/dL (ref 3.5–5.2)
Alkaline Phosphatase: 63 U/L (ref 39–117)
BUN: 10 mg/dL (ref 6–23)
CHLORIDE: 103 meq/L (ref 96–112)
CO2: 28 meq/L (ref 19–32)
Calcium: 9.4 mg/dL (ref 8.4–10.5)
Creatinine, Ser: 0.72 mg/dL (ref 0.40–1.20)
GFR: 98.46 mL/min (ref >60.00)
GLUCOSE: 97 mg/dL (ref 70–99)
POTASSIUM: 3.9 meq/L (ref 3.5–5.1)
SODIUM: 139 meq/L (ref 135–145)
TOTAL PROTEIN: 7.2 g/dL (ref 6.0–8.3)
Total Bilirubin: 0.4 mg/dL (ref 0.2–1.2)

## 2017-03-02 LAB — LIPID PANEL
CHOL/HDL RATIO: 4
Cholesterol: 177 mg/dL (ref 0–200)
HDL: 41.4 mg/dL (ref >39.00)
LDL CALC: 111 mg/dL — AB (ref 0–99)
NONHDL: 135.86
TRIGLYCERIDES: 125 mg/dL (ref 0.0–149.0)
VLDL: 25 mg/dL (ref 0.0–40.0)

## 2017-03-02 LAB — TSH: TSH: 1.96 u[IU]/mL (ref 0.35–4.50)

## 2017-03-02 NOTE — Telephone Encounter (Signed)
UA poct placed today with wellness. Will you result that. Thanks.

## 2017-03-02 NOTE — Progress Notes (Signed)
Pre visit review using our clinic review tool, if applicable. No additional management support is needed unless otherwise documented below in the visit note/SLS  

## 2017-03-02 NOTE — Patient Instructions (Addendum)
For you wellness exam today I have ordered cbc, cmp, tsh, lipid panel, ua and hiv.  Vaccine up to date.  PPD test placed today. Read on Friday.   Recommend exercise and healthy diet.  We will let you know lab results as they come in.  Follow up date appointment will be determined after lab review.   Will fill out pt school form when ppd read.  Placed referral to gyn for pap.   Preventive Care 18-39 Years, Female Preventive care refers to lifestyle choices and visits with your health care provider that can promote health and wellness. What does preventive care include?  A yearly physical exam. This is also called an annual well check.  Dental exams once or twice a year.  Routine eye exams. Ask your health care provider how often you should have your eyes checked.  Personal lifestyle choices, including:  Daily care of your teeth and gums.  Regular physical activity.  Eating a healthy diet.  Avoiding tobacco and drug use.  Limiting alcohol use.  Practicing safe sex.  Taking vitamin and mineral supplements as recommended by your health care provider. What happens during an annual well check? The services and screenings done by your health care provider during your annual well check will depend on your age, overall health, lifestyle risk factors, and family history of disease. Counseling  Your health care provider may ask you questions about your:  Alcohol use.  Tobacco use.  Drug use.  Emotional well-being.  Home and relationship well-being.  Sexual activity.  Eating habits.  Work and work Statistician.  Method of birth control.  Menstrual cycle.  Pregnancy history. Screening  You may have the following tests or measurements:  Height, weight, and BMI.  Diabetes screening. This is done by checking your blood sugar (glucose) after you have not eaten for a while (fasting).  Blood pressure.  Lipid and cholesterol levels. These may be checked every 5  years starting at age 27.  Skin check.  Hepatitis C blood test.  Hepatitis B blood test.  Sexually transmitted disease (STD) testing.  BRCA-related cancer screening. This may be done if you have a family history of breast, ovarian, tubal, or peritoneal cancers.  Pelvic exam and Pap test. This may be done every 3 years starting at age 16. Starting at age 33, this may be done every 5 years if you have a Pap test in combination with an HPV test. Discuss your test results, treatment options, and if necessary, the need for more tests with your health care provider. Vaccines  Your health care provider may recommend certain vaccines, such as:  Influenza vaccine. This is recommended every year.  Tetanus, diphtheria, and acellular pertussis (Tdap, Td) vaccine. You may need a Td booster every 10 years.  Varicella vaccine. You may need this if you have not been vaccinated.  HPV vaccine. If you are 79 or younger, you may need three doses over 6 months.  Measles, mumps, and rubella (MMR) vaccine. You may need at least one dose of MMR. You may also need a second dose.  Pneumococcal 13-valent conjugate (PCV13) vaccine. You may need this if you have certain conditions and were not previously vaccinated.  Pneumococcal polysaccharide (PPSV23) vaccine. You may need one or two doses if you smoke cigarettes or if you have certain conditions.  Meningococcal vaccine. One dose is recommended if you are age 9-21 years and a first-year college student living in a residence hall, or if you have one of  several medical conditions. You may also need additional booster doses.  Hepatitis A vaccine. You may need this if you have certain conditions or if you travel or work in places where you may be exposed to hepatitis A.  Hepatitis B vaccine. You may need this if you have certain conditions or if you travel or work in places where you may be exposed to hepatitis B.  Haemophilus influenzae type b (Hib) vaccine.  You may need this if you have certain risk factors. Talk to your health care provider about which screenings and vaccines you need and how often you need them. This information is not intended to replace advice given to you by your health care provider. Make sure you discuss any questions you have with your health care provider. Document Released: 02/01/2002 Document Revised: 08/25/2016 Document Reviewed: 10/07/2015 Elsevier Interactive Patient Education  2017 [employer].

## 2017-03-02 NOTE — Progress Notes (Signed)
Subjective:    Patient ID: [name], female    DOB: 06-23-1981, 36 y.o.   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 Single(marital problems last year).  Pt is overdue for papsmear. Last one was normal about 5 years ago.  Up to date on tetanus and flu vaccine.  Needs screening hiv.  LMP- on mirena.   Review of Systems  Constitutional: Negative for chills and fatigue.  HENT: Negative for congestion, ear discharge and ear pain.   Respiratory: Negative for cough and chest tightness.   Cardiovascular: Negative for chest pain and palpitations.  Gastrointestinal: Negative for abdominal distention, abdominal pain, constipation and diarrhea.  Genitourinary: Negative for dysuria, flank pain, frequency and urgency.  Musculoskeletal: Negative for back pain.  Skin: Negative for rash.  Neurological: Negative for dizziness, tremors, seizures, light-headedness and headaches.  Hematological: Negative for adenopathy. Does not bruise/bleed easily.  Psychiatric/Behavioral: Negative for behavioral problems, confusion, hallucinations, self-injury and suicidal ideas. The patient is not nervous/anxious.        Hx of ADD.    Past Medical History:  Diagnosis Date  . Asthma   . History of chicken pox   . Pneumonia   . UTI (urinary tract infection)      Social History   Social History  . Marital status: Divorced    Spouse name: N/A  . Number of children: 1  . Years of education: N/A   Occupational History  . Stay at home Mother [employer]   Social History Main Topics  . Smoking status: Never Smoker  . Smokeless tobacco: Never Used  . Alcohol use No  . Drug use: No  . Sexual activity: Yes    Birth control/ protection: IUD   Other Topics Concern  . Not on file    Social History Narrative  . No narrative on file    Past Surgical History:  Procedure Laterality Date  . CESAREAN SECTION  09/27/12    Family History  Problem Relation Age of Onset  . Allergies Sister   . Cancer Sister     fallopian tube  . Fibroids Mother     Malignant  . Diabetes Father   . Colon cancer Maternal Grandfather   . Diabetes Paternal Grandmother   . Liver cancer Paternal Aunt   . Colon polyps Neg Hx   . Esophageal cancer Neg Hx   . Stomach cancer Neg Hx   . Rectal cancer Neg Hx     Allergies  Allergen Reactions  . Aspirin Hives  . Levaquin [Levofloxacin In D5w] Hives  . Pamprin [Apap-Pamabrom-Pyrilamine] Hives    Current Outpatient Prescriptions on File Prior to Visit  Medication Sig Dispense Refill  . albuterol (PROAIR HFA) 108 (90 Base) MCG/ACT inhaler Inhale 2 puffs into the lungs every 6 (six) hours as needed for wheezing. 1 Inhaler 1  . amphetamine-dextroamphetamine (ADDERALL XR) 20 MG 24 hr capsule 2 tab po q day 60 capsule 0  . famotidine (PEPCID) 20 MG tablet Take 1 tablet (20 mg total) by mouth at bedtime. 30 tablet 11  . fluticasone (FLONASE) 50 MCG/ACT nasal spray Place 2 sprays into both nostrils daily. 16 g 2  . levalbuterol (XOPENEX) 1.25 MG/3ML nebulizer solution Take 1.25 mg by nebulization every 4 (four) hours as needed for wheezing. 500  mL 1  . montelukast (SINGULAIR) 10 MG tablet Take 1 tablet (10 mg total) by mouth at bedtime. 30 tablet 1  . Multiple Vitamins-Minerals (MULTIVITAMIN ADULT PO) Take 1 tablet by mouth every morning.    Marland Kitchen. omeprazole (PRILOSEC) 40 MG capsule Take 1 capsule (40 mg total) by mouth daily. 30 capsule 11  . sertraline (ZOLOFT) 50 MG tablet Take 1 tablet (50 mg total) by mouth daily. 30 tablet 1  . SYMBICORT 80-4.5 MCG/ACT inhaler INHALE 2 PUFFS BY MOUTH INTO THE LUNGS 2 (TWO) TIMES DAILY. 10.2 g 0   Current Facility-Administered Medications on File Prior to Visit  Medication Dose Route Frequency Provider Last  Rate Last Dose  . 0.9 %  sodium chloride infusion  500 mL Intravenous Continuous Kavitha Nandigam V, MD        BP 125/71 (BP Location: Left Arm, Patient Position: Sitting, Cuff Size: Large)   Pulse 97   Temp 98.2 F (36.8 C) (Oral)   Resp 16   Ht 5\' 6"  (1.676 m)   Wt 260 lb (117.9 kg)   SpO2 99%   BMI 41.97 kg/m       Objective:   Physical Exam  General Mental Status- Alert. General Appearance- Not in acute distress.   Skin General: Color- Normal Color. Moisture- Normal Moisture.  Neck Carotid Arteries- Normal color. Moisture- Normal Moisture. No carotid bruits. No JVD.  Chest and Lung Exam Auscultation: Breath Sounds:-Normal.  Cardiovascular Auscultation:Rythm- Regular. Murmurs & Other Heart Sounds:Auscultation of the heart reveals- No Murmurs.  Abdomen Inspection:-Inspeection Normal. Palpation/Percussion:Note:No mass. Palpation and Percussion of the abdomen reveal- Non Tender, Non Distended + BS, no rebound or guarding.    Neurologic Cranial Nerve exam:- CN III-XII intact(No nystagmus), symmetric smile. tStrength:- 5/5 equal and symmetric strength both upper and lower extremities.      Assessment & Plan:  For you wellness exam today I have ordered cbc, cmp, tsh, lipid panel, ua and hiv.  Vaccine up to date.  PPD test placed today. Read on Friday.   Recommend exercise and healthy diet.  We will let you know lab results as they come in.  Follow up date appointment will be determined after lab review.   Will fill out pt school form when ppd read.   Hartwell Vandiver, Ramon DredgeEdward, PA-C

## 2017-03-03 LAB — HIV ANTIBODY (ROUTINE TESTING W REFLEX): HIV: NONREACTIVE

## 2017-03-04 LAB — TB SKIN TEST
Induration: 0 mm
TB Skin Test: NEGATIVE

## 2017-03-18 ENCOUNTER — Other Ambulatory Visit: Payer: Self-pay | Admitting: Medical

## 2017-03-31 ENCOUNTER — Other Ambulatory Visit: Payer: Self-pay | Admitting: Medical

## 2017-04-01 ENCOUNTER — Telehealth: Payer: Self-pay | Admitting: Medical

## 2017-04-01 MED ORDER — MONTELUKAST SODIUM 10 MG PO TABS: 10.0000 mg | ORAL_TABLET | Freq: Every day | ORAL | 1 refills | Status: DC

## 2017-04-01 MED ORDER — AMPHETAMINE-DEXTROAMPHET ER 20 MG PO CP24: ORAL_CAPSULE | ORAL | 0 refills | Status: DC

## 2017-04-01 NOTE — Telephone Encounter (Signed)
Notified pt Rx is ready for pick up at front desk

## 2017-04-01 NOTE — Telephone Encounter (Signed)
Pt here for printed script for Adderall. She knows she did not call I advance. Also there are 2 scripts in op pharmacy that are waiting for physician auth to fill:  Zoloft (generic) and singular. Pt cell ph [phone]. Pt here waiting. Pt needs refills on the scripts downstairs.

## 2017-04-01 NOTE — Telephone Encounter (Signed)
Routed refill request to [facility] (Doc of the day) waiting for approval.

## 2017-04-01 NOTE — Telephone Encounter (Signed)
Reviewed controlled substance registry and her fills have been consistent.   Needs to leave UDS please at pick up.

## 2017-04-01 NOTE — Telephone Encounter (Signed)
Refill for Adderall  Last RX:02/02/17 Last OV:03/02/17 Next WU:[id number]  UDS:08/13/16 CSC: 10/28/16  Do you mind signing  [name] out of office today. Pt here waiting in lobby. Please advise

## 2017-05-10 ENCOUNTER — Other Ambulatory Visit: Payer: Self-pay | Admitting: Medical

## 2017-05-11 ENCOUNTER — Telehealth: Payer: Self-pay

## 2017-05-11 ENCOUNTER — Telehealth: Payer: Self-pay | Admitting: Medical

## 2017-05-11 ENCOUNTER — Telehealth: Payer: Self-pay | Admitting: *Deleted

## 2017-05-11 ENCOUNTER — Encounter: Payer: Self-pay | Admitting: Medical

## 2017-05-11 MED ORDER — AMPHETAMINE-DEXTROAMPHET ER 20 MG PO CP24: ORAL_CAPSULE | ORAL | 0 refills | Status: DC

## 2017-05-11 MED ORDER — ALBUTEROL SULFATE HFA 108 (90 BASE) MCG/ACT IN AERS: INHALATION_SPRAY | RESPIRATORY_TRACT | 1 refills | Status: DC

## 2017-05-11 NOTE — Telephone Encounter (Signed)
If insurance authorizes her adderall. Go ahead and get her to do uds when she picks up rx.

## 2017-05-11 NOTE — Telephone Encounter (Signed)
Pt asked when her next fu appt should be for fu regarding her meds.

## 2017-05-11 NOTE — Telephone Encounter (Signed)
It appears on chart review she was on 30 mg xr and around 02-02-2017 she states was on working. If I am not mistaken max dose of xr is 60 mg. Epic had no 40 mg tablet. So wrote 2-20 mg tabs. On chart review this appears to reason.   See what they say. Let me know what max dose they will authorize. I will write that dose. If pt has issue with this then have her follow up with me next week.

## 2017-05-11 NOTE — Telephone Encounter (Signed)
Pt came in for a UDS today. Patient wanted to let the provider aware that she had poppy seed bagels this morning, just incase if something did come back abnormal..

## 2017-05-11 NOTE — Telephone Encounter (Signed)
Nicole SquibbJane from [facility] best # [phone]  [facility] [place] - [id number] [address] [phone] (Phone) [phone] (Fax)   Returning call regarding PA for amphetamine-dextroamphetamine (ADDERALL XR) 20 MG 24 hr capsule

## 2017-05-11 NOTE — Telephone Encounter (Signed)
Pt  insurance is requesting a reason why pt needs to take 2 tabs of Adderall XR 20mg  Pt can not get medication  Until pharmacy has this information. Pt is completely out of medication.

## 2017-05-11 NOTE — Telephone Encounter (Signed)
Appointment next week or end of 1st week of July.

## 2017-05-11 NOTE — Telephone Encounter (Signed)
Patient is requesting refill for   amphetamine-dextroamphetamine (ADDERALL XR) 20 MG 24 hr capsule   [phone] call when ready for pick up

## 2017-05-11 NOTE — Telephone Encounter (Signed)
Refill Request:Adderall  Last RX:04/01/17 Last OV:02/02/2017 Next ZO:[id number]:None Scheduled.  UDS:08/13/16 CSC:10/28/16

## 2017-05-12 ENCOUNTER — Encounter: Payer: Self-pay | Admitting: Medical

## 2017-05-12 MED ORDER — AMPHETAMINE-DEXTROAMPHET ER 30 MG PO CP24: 30.0000 mg | ORAL_CAPSULE | Freq: Every day | ORAL | 0 refills | Status: DC

## 2017-05-12 NOTE — Telephone Encounter (Signed)
30 mg dose adderall rx'd. Signed and gave to Clydie BraunKaren to take to pharmacy in our building.

## 2017-05-12 NOTE — Telephone Encounter (Signed)
Left message for patient to call back and schedule appointment per note from E. Saguier.

## 2017-05-12 NOTE — Telephone Encounter (Signed)
Per Nicole DredgeEdward Adderall  30 mg is the highest dose he can write Rx for. Notified patient states this will be ok. WIll pick up at [facility]

## 2017-05-12 NOTE — Telephone Encounter (Signed)
Patient called for status of this medication. Patient ran out yesterday and has had headaches all day. [phone]

## 2017-05-12 NOTE — Telephone Encounter (Signed)
RX dosage clarified with Pharmacy and patient. Rx for 30 mg being sent to pharmacy. Patient notified.

## 2017-05-12 NOTE — Telephone Encounter (Signed)
Need to find out what dose they cover see  [facility]

## 2017-05-25 ENCOUNTER — Telehealth: Payer: Self-pay | Admitting: Medical

## 2017-05-25 NOTE — Telephone Encounter (Addendum)
Pt drug screen came up positive for morphine. Is she taking any morphine products or codeine. Does she have prescription for either? I will be out of town but will you ask her to make appointment on 06-15-2017 at 1:15. Need to discuss this findings since she is on controlled med vyvanse so important to get understanding how urine showed positive for morphine. Will hold off on refilling vyvanse until we get clarification on morphine in urine. Only 3 days off med since last rx was 05-12-2017.   N[facility]SR checked. Saw prescriptions of ADD meds but no morphine or codeine

## 2017-05-26 NOTE — Telephone Encounter (Signed)
Pt states she does not take anything but the medication that she gets from us and she does not know why morphine was positive in urine. Pt scheduled 06/15/17 at 1:15.

## 2017-06-15 ENCOUNTER — Ambulatory Visit: Payer: Self-pay | Admitting: Medical

## 2017-06-23 ENCOUNTER — Ambulatory Visit: Payer: Self-pay | Admitting: Medical

## 2017-06-23 ENCOUNTER — Ambulatory Visit: Payer: Self-pay | Admitting: Internal Medicine

## 2017-06-23 ENCOUNTER — Telehealth: Payer: Self-pay | Admitting: Medical

## 2017-06-23 DIAGNOSIS — Z0289 Encounter for other administrative examinations: Secondary | ICD-10-CM

## 2017-06-23 NOTE — Telephone Encounter (Signed)
Patient cancelled 3:45p appointment for today and [name] to 06/24/17 at 8:15am, charge or no charge

## 2017-06-24 ENCOUNTER — Ambulatory Visit: Payer: Self-pay | Admitting: Medical

## 2017-06-24 NOTE — Telephone Encounter (Signed)
charge 

## 2017-06-27 ENCOUNTER — Ambulatory Visit: Payer: Self-pay | Admitting: Medical

## 2017-06-27 DIAGNOSIS — Z0289 Encounter for other administrative examinations: Secondary | ICD-10-CM

## 2017-07-01 ENCOUNTER — Encounter: Payer: Self-pay | Admitting: Medical

## 2017-07-04 ENCOUNTER — Other Ambulatory Visit: Payer: Self-pay | Admitting: Family

## 2017-07-04 NOTE — Telephone Encounter (Signed)
Patient scheduled for 07/06/2017

## 2017-07-06 ENCOUNTER — Encounter: Payer: Self-pay | Admitting: Medical

## 2017-07-06 ENCOUNTER — Ambulatory Visit (INDEPENDENT_AMBULATORY_CARE_PROVIDER_SITE_OTHER): Payer: Medicaid Other | Admitting: Medical

## 2017-07-06 VITALS — BP 117/51 | HR 106 | Temp 98.2°F | Resp 16 | Ht 66.0 in | Wt 272.2 lb

## 2017-07-06 DIAGNOSIS — F988 Other specified behavioral and emotional disorders with onset usually occurring in childhood and adolescence: Secondary | ICD-10-CM

## 2017-07-06 DIAGNOSIS — F900 Attention-deficit hyperactivity disorder, predominantly inattentive type: Secondary | ICD-10-CM | POA: Diagnosis not present

## 2017-07-06 NOTE — Progress Notes (Signed)
Subjective:    Patient ID: [name], female    DOB: 26-Apr-1981, 36 y.o.   MRN: [id number]  HPI  Pt in for a follow up.   She states has been a busy summer. Pt still working at [employer]. She is working 29 hours a week. She is going through a divorce and has been busy with her family.  She has plans to go back to school and further her education  Pt has adderall xr 30 mg a day. In the past she was on 40 mg of adderall but insurance would not allow this. So decreased the dose. Pt has been off of med for one week.  She states reduced dose was not adequate  Since I last saw her pinpoint drug test came positive for morphine. Pt non on any medication that would cause a positive. Pt denies any use of codeine cough syrup. She wonders if mix up on lab.  Pt still reports attention issues with daily activities and at work.   Review of Systems  Constitutional: Negative for chills, fatigue and fever.  Respiratory: Negative for cough, chest tightness, shortness of breath and wheezing.   Cardiovascular: Negative for chest pain and palpitations.  Gastrointestinal: Negative for abdominal pain.  Endocrine: Negative for polydipsia and polyuria.  Musculoskeletal: Negative for back pain, joint swelling, myalgias and neck stiffness.  Skin: Negative for rash.  Neurological: Negative for dizziness, syncope, speech difficulty, weakness, numbness and headaches.  Hematological: Negative for adenopathy. Does not bruise/bleed easily.  Psychiatric/Behavioral: Positive for decreased concentration. Negative for behavioral problems, confusion, hallucinations, sleep disturbance and suicidal ideas. The patient is not nervous/anxious.      Past Medical History:  Diagnosis Date  . Asthma   . History of chicken pox   . Pneumonia   . UTI (urinary tract infection)      Social History   Social History  . Marital status: Divorced    Spouse name: N/A  . Number of children: 1  . Years of education: N/A    Occupational History  . Stay at home [employer]   Social History Main Topics  . Smoking status: Never Smoker  . Smokeless tobacco: Never Used  . Alcohol use No  . Drug use: No  . Sexual activity: Yes    Birth control/ protection: IUD   Other Topics Concern  . Not on file   Social History Narrative  . No narrative on file    Past Surgical History:  Procedure Laterality Date  . CESAREAN SECTION  09/27/12    Family History  Problem Relation Age of Onset  . Allergies Sister   . Cancer Sister        fallopian tube  . Fibroids Mother        Malignant  . Diabetes Father   . Colon cancer Maternal Grandfather   . Diabetes Paternal Grandmother   . Liver cancer Paternal Aunt   . Colon polyps Neg Hx   . Esophageal cancer Neg Hx   . Stomach cancer Neg Hx   . Rectal cancer Neg Hx     Allergies  Allergen Reactions  . Aspirin Hives  . Levaquin [Levofloxacin In D5w] Hives  . Pamprin [Apap-Pamabrom-Pyrilamine] Hives    Current Outpatient Prescriptions on File Prior to Visit  Medication Sig Dispense Refill  . albuterol (PROAIR HFA) 108 (90 Base) MCG/ACT inhaler INHALE 2 PUFFS BY MOUTH INTO THE LUNGS EVERY 6 HOURS AS NEEDED FOR WHEEZING 8.5 g 1  .  amphetamine-dextroamphetamine (ADDERALL XR) 30 MG 24 hr capsule Take 1 capsule (30 mg total) by mouth daily. 30 capsule 0  . famotidine (PEPCID) 20 MG tablet Take 1 tablet (20 mg total) by mouth at bedtime. 30 tablet 11  . fluticasone (FLONASE) 50 MCG/ACT nasal spray Place 2 sprays into both nostrils daily. 16 g 2  . levalbuterol (XOPENEX) 1.25 MG/3ML nebulizer solution Take 1.25 mg by nebulization every 4 (four) hours as needed for wheezing. 500 mL 1  . montelukast (SINGULAIR) 10 MG tablet TAKE 1 TABLET BY MOUTH AT BEDTIME 30 tablet 0  . Multiple Vitamins-Minerals (MULTIVITAMIN ADULT PO) Take 1 tablet by mouth every morning.    Marland Kitchen omeprazole (PRILOSEC) 40 MG capsule Take 1 capsule (40 mg total) by mouth daily. 30  capsule 11  . sertraline (ZOLOFT) 50 MG tablet TAKE 1 TABLET (50 MG TOTAL) BY MOUTH DAILY. 30 tablet 0  . SYMBICORT 80-4.5 MCG/ACT inhaler INHALE 2 PUFFS BY MOUTH INTO THE LUNGS 2 (TWO) TIMES DAILY. 10.2 g 0   Current Facility-Administered Medications on File Prior to Visit  Medication Dose Route Frequency Provider Last Rate Last Dose  . 0.9 %  sodium chloride infusion  500 mL Intravenous Continuous Nandigam, Kavitha V, MD        BP (!) 117/51   Pulse (!) 106   Temp 98.2 F (36.8 C) (Oral)   Resp 16   Ht 5\' 6"  (1.676 m)   Wt 272 lb 3.2 oz (123.5 kg)   LMP  (Exact Date)   SpO2 98%   BMI 43.93 kg/m       Objective:   Physical Exam  General Mental Status- Alert. General Appearance- Not in acute distress.   Skin General: Color- Normal Color. Moisture- Normal Moisture.  Neck Carotid Arteries- Normal color. Moisture- Normal Moisture. No carotid bruits. No JVD.  Chest and Lung Exam Auscultation: Breath Sounds:-Normal.  Cardiovascular Auscultation:Rythm- Regular. Murmurs & Other Heart Sounds:Auscultation of the heart reveals- No Murmurs.  Abdomen Inspection:-Inspeection Normal. Palpation/Percussion:Note:No mass. Palpation and Percussion of the abdomen reveal- Non Tender, Non Distended + BS, no rebound or guarding.  Neurologic Cranial Nerve exam:- CN III-XII intact(No nystagmus), symmetric smile. Strength:- 5/5 equal and symmetric strength both upper and lower extremities.      Assessment & Plan:  For your  ADD I would consider writing different medication since you report adderall 30 mg dose is not adequate. However we need to repeat uds today since morphine was detected on your last uds.  After result is back I will need to discuss both results with one of doctors I work with and then let you know if further stimulant/Add meds can be written.  Please give sample today and we will let you know results when they are in.  Klayten Jolliff, Ramon Dredge, PA-C

## 2017-07-06 NOTE — Patient Instructions (Addendum)
For your  ADD I would consider writing different medication since you report adderall 30 mg dose is not adequate. However we need to repeat uds today since morphine was detected on your last uds.  After result is back I will need to discuss both results with one of doctors I work with and then let you know if further stimulant/Add meds can be written.  Please give sample today and we will let you know results when they are in.

## 2017-07-14 ENCOUNTER — Ambulatory Visit: Payer: Self-pay | Admitting: Internal Medicine

## 2017-07-20 ENCOUNTER — Other Ambulatory Visit: Payer: Self-pay | Admitting: Medical

## 2017-08-08 ENCOUNTER — Telehealth: Payer: Self-pay | Admitting: Medical

## 2017-08-08 ENCOUNTER — Telehealth: Payer: Self-pay

## 2017-08-08 NOTE — Telephone Encounter (Signed)
Pt drug screen came back negative. But discussed with Dr. Laury Axon and if in future she has + for any medication that should not be in system then can't prescribe further. But I need clarification on what medication she wants and what dose. I remember at one point her insurance would not fill vyvanse. Doe she wants vyvanse and what dose. Last vyvanse rx I see in chart was 20 mg.

## 2017-08-08 NOTE — Telephone Encounter (Signed)
Opened to review 

## 2017-08-08 NOTE — Telephone Encounter (Signed)
Self.   Refill for Adderall   CB: [phone]   Pt says that she hasn't had her medication in atleast 2 months due to provider not refilling. She said that at her last visit she was advised that he would call her with results. Since then she hasn't taken her medication.     Pt says that she has never received a call back for  her last lab and uds results.

## 2017-08-08 NOTE — Telephone Encounter (Signed)
SE-Patient is calling about a refill of Adderall/Results of UDS states "Can continue Vyvanse" 7.26.18/what would you like for me to tell this young lady? Plz advise/thx dmf

## 2017-08-09 ENCOUNTER — Encounter: Payer: Self-pay | Admitting: Internal Medicine

## 2017-08-09 ENCOUNTER — Telehealth: Payer: Self-pay | Admitting: Medical

## 2017-08-09 ENCOUNTER — Other Ambulatory Visit: Payer: Self-pay | Admitting: *Deleted

## 2017-08-09 ENCOUNTER — Ambulatory Visit (INDEPENDENT_AMBULATORY_CARE_PROVIDER_SITE_OTHER): Payer: Medicaid Other | Admitting: Internal Medicine

## 2017-08-09 DIAGNOSIS — J4531 Mild persistent asthma with (acute) exacerbation: Secondary | ICD-10-CM

## 2017-08-09 MED ORDER — ALBUTEROL SULFATE HFA 108 (90 BASE) MCG/ACT IN AERS: INHALATION_SPRAY | RESPIRATORY_TRACT | 11 refills | Status: DC

## 2017-08-09 MED ORDER — BUDESONIDE-FORMOTEROL FUMARATE 160-4.5 MCG/ACT IN AERO: INHALATION_SPRAY | RESPIRATORY_TRACT | 11 refills | Status: DC

## 2017-08-09 MED ORDER — BUDESONIDE-FORMOTEROL FUMARATE 160-4.5 MCG/ACT IN AERO: 2.0000 | INHALATION_SPRAY | Freq: Two times a day (BID) | RESPIRATORY_TRACT | 0 refills | Status: DC

## 2017-08-09 NOTE — Telephone Encounter (Signed)
SE-The patient had called and requested the Adderall/plz advise/thx dmf

## 2017-08-09 NOTE — Progress Notes (Signed)
Subjective:    Patient ID: [name], female    DOB: 08-Aug-1981   MRN: [id number]    Brief patient profile:  36  yowf dx with asthma age 36 with good control including nl pregnancy while on advair 250 in 2007   History of Present Illness  08/17/2011 Initial pulmonary office eval in EMR era cc asthma worse since 2009 p mold exposure thru Feb 2012 when left that  House> moved to [place] p admitted to [facility] Feb 2012 and discharged initially on advair for did ok for  several weeks while on prednisone then downhill since > sev ER trips and has developed neb dependence -Tried symbicort and dulera through urgent.  Took 2 puffs of dulera am of ov but still needed neb 2hours later. rec Dulera 100 Take 2 puffs first thing in am and then another 2 puffs about 12 hours later.  Work on [employer], commercial:   Prednisone 10 mg take  4 each am x 2 days,   2 each am x 2 days,  1 each am x2days and stop  If can't catch your breath ok to ventolin up to 2 puffs and only if this doesn't work ok to use the nebulizer but stop using the spacer Prilosec 20mg   Take 30-60 min before first meal of the day and Pepcid 20 mg one bedtime         Admit date: 11/26/2012  Discharge date: 11/29/2012  Discharge Diagnoses:   ASTHMA, WITH ACUTE EXACERBATION   Asthmatic bronchitis  Hypokalemia  URI, acute  Leukocytosis  Dehydration  Sinus tachycardia  12/06/2012 post hosp f/u ov/Wert  back to baseline x still using too much saba "the way she was in the hospital" with minimal residual nasal and ear congestion, no purulent sputum, almost all done with prednisone. rec Plan A Dulera 100 Take 2 puffs first thing in am and then another 2 puffs about 12 hours later.  Only use your albuterol (xopenex inhaler Plan B,  xopenex neb is plan C)  as a rescue medication  GERD diet  12/25/2012 f/u ov/Wert cc some better then  C/o gradual increased SOB and wheezing x 2 wks, worse for the past couple days.  Still has prod cough with dark green sputum since the last visit here. In retrospect never cleared nasal congestion and not using xopenex at all then started using hfa daytime and neb at night.  rec Augmentin 875 twice daily x 10 days and if head congestion not resolved completely need a sinus CT > [name] Plan A:  Dulera 200 Take 2 puffs first thing in am and then another 2 puffs about 12 hours later.  Only use your albuterol (xopenex inhaler Plan B,  xopenex neb is plan C)    01/29/2013 f/u ov/Wert cc never got CT, never got completely bettter with nasal congestion but much better vs last ov and very little need for saba.  Did not call for ct sinus as rec rec singulair 10 mg one each pm  if head congestion not resolved completely need a sinus CT > [name] Plan A:  Dulera 200 Take 2 puffs first thing in am and then another 2 puffs about 12 hours later.  Only use your albuterol (xopenex inhaler Plan B,  xopenex neb is plan C)  as a rescue medication   06/25/2013 f/u ov/Wert re asthma Chief Complaint  Patient presents with  . Follow-up    Pt states overall  her breathing is doing well. She does notice has increased symptoms when she is outside alot.   rare need for saba as long as has access to dulera ,  Does still have Wheeze sob, and cough on exp to outdoors, cats and dogs. rec Dulera 200 Take 2 puffs first thing in am and then another 2 puffs about 12 hours later.  Stop singulair You will need to drug formulary to see if your company covers symbicort or advair better.      10/03/2013 f/u ov/Wert re: asthma flare off dulera x sev weeks Chief Complaint  Patient presents with  . Acute Visit    Pt reports having increased wheezing and SOB- onset 2 days ago.  She states that she woke up in the night last night feeling out of breath and had to use proair with no relief. She has also had prod cough with large amounts of green sputum x 2 days.    used both hfa and neb in last 24  h rec Resume dulera 200 Take 2 puffs first thing in am and then another 2 puffs about 12 hours later - you cannot under any circumstances be off this medication or a similar maintenance inhaler given the severity of your asthma so call us if any problem acquiring it through your company  Only use your albuterol (proaire) as a rescue medication   12/03/2013 f/u ov/Wert re: asthma Chief Complaint  Patient presents with  . Follow-up    Pt states that her breathing is overall doing well today. No new co's today.   when on dulera does fine s need for saba hfa/ neb but finding dulera too expensive under her commercial insurance plan  rec Stop dulera Start symbicort 160 Take 2 puffs first thing in am and then another 2 puffs about 12 hours later.  Only use your albuterol as a rescue medication    09/30/2014 f/u ov/Wert re: asthma/ doing great on singulair/symbicort 160 2bid but does not carry rescue any more Chief Complaint  Patient presents with  . Follow-up    Pt states that her breathing is doing well and denies any new co's today.   rec No change for now but may able to reduce the dose to 80/4.5 2 every 12 hours when you return if you continue to do so well Don't leave home without your rescue inhaler   03/31/2015 f/u ov/Wert re: asthma/ rarely needing rescue Chief Complaint  Patient presents with  . Follow-up    allergies are bothering her at this time; hoarseness; prod cough w/green mucus; runny nose  decades of problems cat/dogs may be getting worse and all fm members own them  Worse symptoms since Jan 2016 with eyes puffy  Stopped singulair sev weeks prior to OV  As not effective > no change noted since then  Prednisone helped a lot completed  03/12/15  Has not tried zyrtec  rec Try dymista one twice daily each nostil until seen again here - don't fill rx if not helping and need for zyrtec declines  Try zyrtec 10 mg daily as needed for itchy/ sneezy/ runny nose  We will refill  you albuterol to use as needed  and your maintenance inhaler = symbicort 160      06/23/2016  f/u ov/Wert re:  Asthma/ rx maint on symb 160/ dymista / no flares since last ov  Chief Complaint  Patient presents with  . Follow-up    Breathing is doing well.  She  is coughing less. She rarely uses albuterol.   also good control of rhinitis on dymista/ stays on zyrtec even when better rec Ok to just use the zyrtec as needed for itching/ sneezing/ runny nose Not clear insurance ever paid for dysmista >  changed back to flonase shortly after ov      08/09/2017  Extended f/u ov/Wert re: re-establish care p flare/ UC eval   maint on gerd rx and symb 80 2bid/ singulair  Chief Complaint  Patient presents with  . Follow-up    Pt states breathing has been worse off and on since symbicort was lowered.  She is using her albuterol inhaler 1-2 x per day. She states had asthma flare and had to go to [facility] 08/07/17 and was put on pred. She is coughing up some green sputum.   fine x 3 months on flonase/ symb 80  then pna oct 2017 needing more albuterol but better again march 2018- early August 2018 rare need for albuterol and 07/31/17 UC for flare > prednisone rx  Cough and wheezing and discolored sputum all improving on prednisone and thinks the 80 strength is not adequate  Presently Not limited by breathing from desired activities     No obvious day to day or daytime variability or assoc   mucus plugs or hemoptysis or cp or chest tightness, subjective wheeze or overt sinus or hb symptoms. No unusual exp hx or h/o childhood pna/ asthma or knowledge of premature birth.  Sleeping ok without nocturnal  or early am exacerbation  of respiratory  c/o's or need for noct saba. Also denies any obvious fluctuation of symptoms with weather or environmental changes or other aggravating or alleviating factors except as outlined above   Current Medications, Allergies, Complete Past Medical History, Past Surgical History,  Family History, and Social History were reviewed in [employer] record.  ROS  The following are not active complaints unless bolded sore throat, dysphagia, dental problems, itching, sneezing,  nasal congestion or excess/ purulent nasl secretions, ear ache,   fever, chills, sweats, unintended wt loss, classically pleuritic or exertional cp,  orthopnea pnd or leg swelling, presyncope, palpitations, abdominal pain, anorexia, nausea, vomiting, diarrhea  or change in bowel or bladder habits, change in stools or urine, dysuria,hematuria,  rash, arthralgias, visual complaints, headache, numbness, weakness or ataxia or problems with walking or coordination,  change in mood/affect or memory.                  Objective:   Physical Exam  08/17/2011  245 > 234 12/06/2012  > 234 12/25/2012 >  248 06/25/2013 > 10/03/2013  252 > 12/03/2013 263> 09/30/2014 288 > 03/31/2015   280  >  06/23/2016   259  > 08/09/2017   274   amb wf nad     Vital signs reviewed - Note on arrival 02 sats  99% on RA    HEENT: nl dentition, turbinates, and orophanx. Nl external ear canals without cough reflex   NECK :  without JVD/Nodes/TM/ nl carotid upstrokes bilaterally   LUNGS: very minimal insp squeaks/ no significant wheeze    CV:  RRR  no s3 or murmur or increase in P2, no edema   ABD:  Quite obese  soft and nontender with nl excursion in the supine position. No bruits or organomegaly, bowel sounds nl  MS:  warm without deformities, calf tenderness, cyanosis or clubbing     Neuro:  Alert/ approp, nl gait s deficits  Assessment & Plan:

## 2017-08-09 NOTE — Telephone Encounter (Signed)
Would you mind printing the Adderall XR 30 mg prescription. Instructions will be 1 tablet by mouth daily 30 tablets with no refills. If you would think that please and get me to sign it.

## 2017-08-09 NOTE — Patient Instructions (Addendum)
Plan A = Automatic = change to symbicort 160 Take 2 puffs first thing in am and then another 2 puffs about 12 hours later.   Work on maintaining perfect  inhaler technique:  relax and gently blow all the way out then take a nice smooth deep breath back in, triggering the inhaler at same time you start breathing in.  Hold for up to 5 seconds if you can. Blow out thru nose. Rinse and gargle with water when done     Plan B = Backup Only use your albuterol as a rescue medication to be used if you can't catch your breath by resting or doing a relaxed purse lip breathing pattern.  - The less you use it, the better it will work when you need it. - Ok to use the inhaler up to 2 puffs  every 4 hours if you must but call for appointment if use goes up over your usual need - Don't leave home without it !!  (think of it like the spare tire for your car)   Plan C = Crisis - only use your albuterol nebulizer if you first try Plan B and it fails to help > ok to use the nebulizer up to every 4 hours but if start needing it regularly call for immediate appointment   Plan D = Doctor - call me if B and C not adequate  Plan E = ER - go to ER or call 911 if all else fails     Please schedule a follow up visit in 3 months but call sooner if needed to see NP

## 2017-08-09 NOTE — Assessment & Plan Note (Addendum)
-    Allergy profile   12/25/2012 > Eos 10%,  Ig E  222 Pos dogs/ cats/grass  - referred to allergy 4/11/6 > cancelled as much improved on symb/dysmista - FENO 06/23/2016  =   32 on symb 160 2bid > no changes needed   - 08/09/2017  After extensive coaching HFA effectiveness =    90% from a baseline 75% > change to symb 160 2bid   DDX of  difficult airways management almost all start with A and  include Adherence, Ace Inhibitors, Acid Reflux, Active Sinus Disease, Alpha 1 Antitripsin deficiency, Anxiety masquerading as Airways dz,  ABPA,  Allergy(esp in young), Aspiration (esp in elderly), Adverse effects of meds,  Active smokers, A bunch of PE's (a small clot burden can't cause this syndrome unless there is already severe underlying pulm or vascular dz with poor reserve) plus two Bs  = Bronchiectasis and Beta blocker use..and one C= CHF   Adherence is always the initial "prime suspect" and is a multilayered concern that requires a "trust but verify" approach in every patient - starting with knowing how to use medications, especially inhalers, correctly, keeping up with refills and understanding the fundamental difference between maintenance and prns vs those medications only taken for a very short course and then stopped and not refilled.  - see hfa teaching  - rule of two's reviewed - should have come in a long time ago   ? Acid (or non-acid) GERD > always difficult to exclude as up to 75% of pts in some series report no assoc GI/ Heartburn symptoms> rec continue max (24h)  acid suppression and diet restrictions/ reviewed     ? Allergy > reviewed avoidance to known allergens/ singulair/ flonase and increase the symb to 160 2bid    ? Active sinus dz ? Needs  abx but at this point improving on pred, consider abx next if not 100% and then sinus ct to complete the w/u   ? Anxiety > usually at the bottom of this list of usual suspects but should be much higher on this pt's based on H and P and note  already on psychotropics and may interfere with adherence > Follow up per Primary Care planned     F/u q 3 m    I had an extended discussion with the patient reviewing all relevant studies completed to date and  lasting 25 minutes of a 40  minute office visit to re-establish for asthma f/u with  severe non-specific but potentially very serious refractory respiratory symptoms of uncertain and potentially multiple  etiologies.   Each maintenance medication was reviewed in detail including most importantly the difference between maintenance and prns and under what circumstances the prns are to be triggered using an action plan format that is not reflected in the computer generated alphabetically organized AVS.    Please see AVS for specific instructions unique to this office visit that I personally wrote and verbalized to the the pt in detail and then reviewed with pt  by my nurse highlighting any changes in therapy/plan of care  recommended at today's visit.

## 2017-08-09 NOTE — Assessment & Plan Note (Signed)
Body mass index is 44.22 kg/m.  -  trending up  Lab Results  Component Value Date   TSH 1.96 03/02/2017     Contributing to gerd risk/ doe/reviewed the need and the process to achieve and maintain neg calorie balance > defer f/u primary care including intermittently monitoring thyroid status

## 2017-08-10 ENCOUNTER — Other Ambulatory Visit: Payer: Self-pay

## 2017-08-10 MED ORDER — AMPHETAMINE-DEXTROAMPHET ER 30 MG PO CP24: 30.0000 mg | ORAL_CAPSULE | Freq: Every day | ORAL | 0 refills | Status: DC

## 2017-08-10 NOTE — Telephone Encounter (Signed)
Opened to review 

## 2017-08-10 NOTE — Telephone Encounter (Signed)
Per ES printed Adderall XR 30mg  #30/thx dmf

## 2017-08-10 NOTE — Telephone Encounter (Signed)
I will refill her adderall xr 30 mg tabs #30 1 tab po q day. No refills. But epic states it was written. I don't remember signing?? Will you look into that and let me know.  Let pt know we are working on refilling the prescription

## 2017-08-10 NOTE — Telephone Encounter (Signed)
Notified pt rx ready for pick up at front desk.  °

## 2017-08-11 NOTE — Telephone Encounter (Signed)
Rx was signed already. Pt notified rx ready for pick up

## 2017-09-16 ENCOUNTER — Other Ambulatory Visit: Payer: Self-pay | Admitting: Medical

## 2017-09-16 ENCOUNTER — Other Ambulatory Visit: Payer: Self-pay | Admitting: Family

## 2017-09-16 NOTE — Telephone Encounter (Signed)
Rx approved and sent to the pharmacy by e-script.//AB/CMA 

## 2017-09-23 ENCOUNTER — Ambulatory Visit (INDEPENDENT_AMBULATORY_CARE_PROVIDER_SITE_OTHER): Payer: Medicaid Other | Admitting: Medical

## 2017-09-23 VITALS — BP 121/76 | HR 94 | Temp 98.2°F | Resp 16 | Ht 66.0 in | Wt 270.8 lb

## 2017-09-23 DIAGNOSIS — J029 Acute pharyngitis, unspecified: Secondary | ICD-10-CM | POA: Diagnosis not present

## 2017-09-23 DIAGNOSIS — J0100 Acute maxillary sinusitis, unspecified: Secondary | ICD-10-CM

## 2017-09-23 DIAGNOSIS — R062 Wheezing: Secondary | ICD-10-CM

## 2017-09-23 MED ORDER — AMOXICILLIN-POT CLAVULANATE 875-125 MG PO TABS: 1.0000 | ORAL_TABLET | Freq: Two times a day (BID) | ORAL | 0 refills | Status: DC

## 2017-09-23 MED ORDER — BENZONATATE 100 MG PO CAPS: 100.0000 mg | ORAL_CAPSULE | Freq: Three times a day (TID) | ORAL | 0 refills | Status: DC | PRN

## 2017-09-23 MED ORDER — PREDNISONE 10 MG PO TABS: ORAL_TABLET | ORAL | 0 refills | Status: DC

## 2017-09-23 NOTE — Progress Notes (Signed)
Subjective:    Patient ID: [name], female    DOB: 1981-10-15, 36 y.o.   MRN: [id number]  HPI  Pt in for evaluation.   She states had recently got sick. One of her children has pink eye and other has strep.  Pt is nasal congestion, runny nose, hoarse voice, mild st and mild ear pain.(symptoms for 2 days).  No fever, no chills or sweats.  Pt states 2 days ago she started to wheeze. She is on symbicort. She states last 24 hour having to use neb machine every 4 hours.    Review of Systems  Constitutional: Negative for chills, fatigue and fever.  HENT: Positive for congestion, sinus pain, sinus pressure and sore throat. Negative for nosebleeds and postnasal drip.   Respiratory: Positive for cough and wheezing. Negative for chest tightness.   Cardiovascular: Negative for chest pain and palpitations.  Gastrointestinal: Negative for abdominal pain.  Musculoskeletal: Negative for back pain and myalgias.  Skin: Negative for rash.  Neurological: Negative for dizziness, speech difficulty, weakness, light-headedness and headaches.  Hematological: Negative for adenopathy. Does not bruise/bleed easily.  Psychiatric/Behavioral: Negative for behavioral problems and confusion.    Past Medical History:  Diagnosis Date  . Asthma   . History of chicken pox   . Pneumonia   . UTI (urinary tract infection)      Social History   Social History  . Marital status: Divorced    Spouse name: N/A  . Number of children: 1  . Years of education: N/A   Occupational History  . Stay at home Mother [employer]   Social History Main Topics  . Smoking status: Never Smoker  . Smokeless tobacco: Never Used  . Alcohol use No  . Drug use: No  . Sexual activity: Yes    Birth control/ protection: IUD   Other Topics Concern  . Not on file   Social History Narrative  . No narrative on file    Past Surgical History:  Procedure Laterality Date  . CESAREAN SECTION  09/27/12     Family History  Problem Relation Age of Onset  . Allergies Sister   . Cancer Sister        fallopian tube  . Fibroids Mother        Malignant  . Diabetes Father   . Colon cancer Maternal Grandfather   . Diabetes Paternal Grandmother   . Liver cancer Paternal Aunt   . Colon polyps Neg Hx   . Esophageal cancer Neg Hx   . Stomach cancer Neg Hx   . Rectal cancer Neg Hx     Allergies  Allergen Reactions  . Aspirin Hives  . Levaquin [Levofloxacin In D5w] Hives  . Pamprin [Apap-Pamabrom-Pyrilamine] Hives    Current Outpatient Prescriptions on File Prior to Visit  Medication Sig Dispense Refill  . albuterol (PROAIR HFA) 108 (90 Base) MCG/ACT inhaler INHALE 2 PUFFS BY MOUTH INTO THE LUNGS EVERY 6 HOURS AS NEEDED FOR WHEEZING 8.5 g 11  . amphetamine-dextroamphetamine (ADDERALL XR) 30 MG 24 hr capsule Take 1 capsule (30 mg total) by mouth daily. 30 capsule 0  . budesonide-formoterol (SYMBICORT) 160-4.5 MCG/ACT inhaler Take 2 puffs first thing in am and then another 2 puffs about 12 hours later. 1 Inhaler 11  . budesonide-formoterol (SYMBICORT) 160-4.5 MCG/ACT inhaler Inhale 2 puffs into the lungs 2 (two) times daily. 1 Inhaler 0  . famotidine (PEPCID) 20 MG tablet Take 1 tablet (20 mg total) by mouth at  bedtime. 30 tablet 11  . fluticasone (FLONASE) 50 MCG/ACT nasal spray INSTILL 2 SPRAYS INTO BOTH NOSTRILS DAILY 16 g 0  . montelukast (SINGULAIR) 10 MG tablet TAKE 1 TABLET BY MOUTH AT BEDTIME 30 tablet 5  . Multiple Vitamins-Minerals (MULTIVITAMIN ADULT PO) Take 1 tablet by mouth every morning.    Marland Kitchen omeprazole (PRILOSEC) 40 MG capsule Take 1 capsule (40 mg total) by mouth daily. 30 capsule 11  . predniSONE (DELTASONE) 10 MG tablet Take 10 mg by mouth as directed.    . sertraline (ZOLOFT) 50 MG tablet TAKE 1 TABLET (50 MG TOTAL) BY MOUTH DAILY. 30 tablet 0   No current facility-administered medications on file prior to visit.     BP 121/76   Pulse (!) 106   Temp 98.2 F (36.8 C)  (Oral)   Resp 16   Ht  (1.676 m)   Wt 270 lb 12.8 oz (122.8 kg)   SpO2 95%   BMI 43.71 kg/m      Objective:   Physical Exam  General  Mental Status - Alert. General Appearance - Well groomed. Not in acute distress.  Skin Rashes- No Rashes.  HEENT Head- Normal. Ear Auditory Canal - Left- Normal. Right - Normal.Tympanic Membrane- Left- Normal. Right- Normal. Eye Sclera/Conjunctiva- Left- Normal. Right- Normal. Nose & Sinuses Nasal Mucosa- Left-  Boggy and Congested. Right-  Boggy and  Congested.Bilateral maxillary and frontal sinus pressure. Mouth & Throat Lips: Upper Lip- Normal: no dryness, cracking, pallor, cyanosis, or vesicular eruption. Lower Lip-Normal: no dryness, cracking, pallor, cyanosis or vesicular eruption. Buccal Mucosa- Bilateral- No Aphthous ulcers. Oropharynx- No Discharge or Erythema. Tonsils: Characteristics- Bilateral- No Erythema or Congestion. Size/Enlargement- Bilateral- No enlargement. Discharge- bilateral-None.  Neck Neck- Supple. No Masses.   Chest and Lung Exam Auscultation: Breath Sounds:-Clear even and unlabored.  Cardiovascular Auscultation:Rythm- Regular, rate and rhythm. Murmurs & Other Heart Sounds:Ausculatation of the heart reveal- No Murmurs.  Lymphatic Head & Neck General Head & Neck Lymphatics: Bilateral: Description- No Localized lymphadenopathy.       Assessment & Plan:  Your appear to have a sinus infection and pharyngitis. I am prescribing  augmentin antibiotic for the infection. To help with the nasal congestion continue flonase nasal steroid. For your associated cough, I prescribed cough medicine benzonatate  For wheezing continue symbicort and albuterol if needed. Will rx tapered prednisone due to moderate scattered wheezing on lung exam.  If signs/symptoms persists get cxr. If worsening breathing/wheezing despite above then ED evaluation.  Rest, hydrate, tylenol for fever.  Follow up in 7 days or as  needed.  Derico Mitton, Ramon Dredge, PA-C

## 2017-09-23 NOTE — Patient Instructions (Addendum)
Your appear to have a sinus infection and pharyngitis. I am prescribing  augmentin antibiotic for the infection. To help with the nasal congestion continue flonase nasal steroid. For your associated cough, I prescribed cough medicine benzonatate  For wheezing continue symbicort and albuterol if needed. Will rx tapered prednisone due to moderate scattered wheezing on lung exam.  If signs/symptoms persists get cxr. If worsening breathing/wheezing despite above then ED evaluation.  Rest, hydrate, tylenol for fever.  Follow up in 7 days or as needed.

## 2017-10-13 ENCOUNTER — Encounter: Payer: Self-pay | Admitting: Family Medicine

## 2017-10-13 ENCOUNTER — Ambulatory Visit (INDEPENDENT_AMBULATORY_CARE_PROVIDER_SITE_OTHER): Payer: Medicaid Other | Admitting: Family Medicine

## 2017-10-13 VITALS — BP 110/72 | HR 97 | Temp 97.6°F | Ht 66.0 in | Wt 272.4 lb

## 2017-10-13 DIAGNOSIS — J069 Acute upper respiratory infection, unspecified: Secondary | ICD-10-CM

## 2017-10-13 DIAGNOSIS — Z23 Encounter for immunization: Secondary | ICD-10-CM

## 2017-10-13 DIAGNOSIS — J4541 Moderate persistent asthma with (acute) exacerbation: Secondary | ICD-10-CM

## 2017-10-13 MED ORDER — BENZONATATE 100 MG PO CAPS: 100.0000 mg | ORAL_CAPSULE | Freq: Three times a day (TID) | ORAL | 0 refills | Status: DC | PRN

## 2017-10-13 MED ORDER — PREDNISONE 20 MG PO TABS: 40.0000 mg | ORAL_TABLET | Freq: Every day | ORAL | 0 refills | Status: DC

## 2017-10-13 NOTE — Patient Instructions (Addendum)
Continue to push fluids, practice good hand hygiene, and cover your mouth if you cough.  If you start having fevers, shaking or shortness of breath, seek immediate care.  Let us know if you need anything.  

## 2017-10-13 NOTE — Addendum Note (Signed)
Addended by: Scharlene GlossEWING, Jolly Bleicher B on: 10/13/2017 09:45 AM   Modules accepted: Orders

## 2017-10-13 NOTE — Progress Notes (Signed)
Chief Complaint  Patient presents with  . Cough    congestion  . Wheezing    Leone Brandrisha B Bundy here for URI complaints.  Duration: 4 days  Associated symptoms: sinus congestion, sinus pain, sore throat, wheezing, shortness of breath, fevers of 102 F yesterday, and cough Denies: rhinorrhea, itchy watery eyes, ear pain, ear drainage and rigors Treatment to date: Breathing treatments, Aleve, Vick's, Mucinex D Sick contacts: Yes - boys have been sick  ROS:  Const: Denies fevers HEENT: As noted in HPI Lungs: +SOB  Past Medical History:  Diagnosis Date  . Asthma   . History of chicken pox   . Pneumonia   . UTI (urinary tract infection)    Family History  Problem Relation Age of Onset  . Allergies Sister   . Cancer Sister        fallopian tube  . Fibroids Mother        Malignant  . Diabetes Father   . Colon cancer Maternal Grandfather   . Diabetes Paternal Grandmother   . Liver cancer Paternal Aunt   . Colon polyps Neg Hx   . Esophageal cancer Neg Hx   . Stomach cancer Neg Hx   . Rectal cancer Neg Hx     BP 110/72 (BP Location: Left Arm, Patient Position: Sitting, Cuff Size: Large)   Pulse 97   Temp 97.6 F (36.4 C) (Oral)   Ht 5\' 6"  (1.676 m)   Wt 272 lb 6 oz (123.5 kg)   SpO2 95%   BMI 43.96 kg/m  General: Awake, alert, appears stated age HEENT: AT, [place], ears patent b/l and TM neg on R, retracted on L, nares patent w/o discharge, pharynx pink and without exudates, MMM Neck: No masses or asymmetry Heart: RRR, no murmurs, no bruits Lungs: Diffuse wheezing, no accessory muscle use Psych: Age appropriate judgment and insight, normal mood and affect  Viral URI - Plan: benzonatate (TESSALON) 100 MG capsule  Moderate persistent asthma with exacerbation - Plan: predniSONE (DELTASONE) 20 MG tablet  Orders as above. Continue to push fluids, practice good hand hygiene, cover mouth when coughing. F/u prn. If starting to experience fevers, shaking, or shortness of breath,  seek immediate care. Pt voiced understanding and agreement to the plan.  Jilda Rocheicholas Paul GodleyWendling, DO 10/13/17 9:34 AM

## 2017-10-13 NOTE — Progress Notes (Signed)
Pre visit review using our clinic review tool, if applicable. No additional management support is needed unless otherwise documented below in the visit note. 

## 2017-11-09 ENCOUNTER — Ambulatory Visit: Payer: Self-pay | Admitting: Adult Health

## 2017-11-15 ENCOUNTER — Ambulatory Visit (INDEPENDENT_AMBULATORY_CARE_PROVIDER_SITE_OTHER): Payer: Medicaid Other | Admitting: Medical

## 2017-11-15 ENCOUNTER — Encounter: Payer: Self-pay | Admitting: Medical

## 2017-11-15 VITALS — BP 120/86 | HR 95 | Temp 98.1°F | Resp 16 | Ht 65.0 in | Wt 280.4 lb

## 2017-11-15 DIAGNOSIS — R062 Wheezing: Secondary | ICD-10-CM

## 2017-11-15 DIAGNOSIS — R059 Cough, unspecified: Secondary | ICD-10-CM

## 2017-11-15 DIAGNOSIS — J40 Bronchitis, not specified as acute or chronic: Secondary | ICD-10-CM

## 2017-11-15 DIAGNOSIS — R05 Cough: Secondary | ICD-10-CM | POA: Diagnosis not present

## 2017-11-15 DIAGNOSIS — F988 Other specified behavioral and emotional disorders with onset usually occurring in childhood and adolescence: Secondary | ICD-10-CM

## 2017-11-15 MED ORDER — ALBUTEROL SULFATE HFA 108 (90 BASE) MCG/ACT IN AERS: INHALATION_SPRAY | RESPIRATORY_TRACT | 11 refills | Status: DC

## 2017-11-15 MED ORDER — OMEPRAZOLE 40 MG PO CPDR: 40.0000 mg | DELAYED_RELEASE_CAPSULE | Freq: Every day | ORAL | 11 refills | Status: DC

## 2017-11-15 MED ORDER — HYDROCODONE-HOMATROPINE 5-1.5 MG/5ML PO SYRP
5.0000 mL | ORAL_SOLUTION | Freq: Three times a day (TID) | ORAL | 0 refills | Status: DC | PRN
Start: 2017-11-15 — End: 2018-04-10

## 2017-11-15 MED ORDER — PREDNISONE 10 MG PO TABS: ORAL_TABLET | ORAL | 0 refills | Status: DC

## 2017-11-15 MED ORDER — AZITHROMYCIN 250 MG PO TABS: ORAL_TABLET | ORAL | 0 refills | Status: DC

## 2017-11-15 MED ORDER — AMPHETAMINE-DEXTROAMPHET ER 30 MG PO CP24: 30.0000 mg | ORAL_CAPSULE | Freq: Every day | ORAL | 0 refills | Status: DC

## 2017-11-15 NOTE — Patient Instructions (Addendum)
You appear to have bronchitis. Rest hydrate and tylenol for fever. I am prescribing cough medicine hycodan , and azithromycin antibiotic. For your nasal congestion use your Flonase nasal spray.  For recent flare of wheezing, I want you to continue your Symbicort daily.  Refilling your albuterol inhaler and giving 5-day taper dose of prednisone.  For your ADD, you signed controlled medication contract today.  I refilled your Adderall.  You should gradually get better. If not then notify us and would recommend a chest xray.(You could update as by Thursday or Friday if not feeling significantly better.  In that event I could put in a chest x-ray order and you can get that done.)  Follow up in 7-10 days or as needed

## 2017-11-15 NOTE — Progress Notes (Signed)
Subjective:    Patient ID: [name], female    DOB: 02-24-1981, 36 y.o.   MRN: [id number]  HPI  Pt in for some chest congestion with some wheezing. Pt seen in late October by Dr. Carmelia RollerWendling. She was given benzonatate and prednisone. Pt was given prednisone 40 mg q day for 10 days.   Pt states her symptoms improved but then about one week after stopped prednisone she started to get cough with occasional productive cough. Brown/yellow colored mucous.   No fevers, no chills, no sweats or body aches. Has been tired with above symptoms. Not sleeping well due to cough.  Pt has ADD. Pt doing well on Adderal. She is up to date on drug screen. Needs new updated drug screen. Printed today so pt can sign.   Review of Systems  Constitutional: Negative for chills, fatigue and fever.  HENT: Negative for congestion, postnasal drip, rhinorrhea, sinus pressure, sinus pain and sneezing.   Respiratory: Positive for cough and wheezing. Negative for chest tightness and shortness of breath.        Occasional wheeze with cough.  Cardiovascular: Negative for chest pain and palpitations.  Gastrointestinal: Negative for abdominal pain.  Musculoskeletal: Negative for back pain.  Skin: Negative for rash.  Neurological: Negative for dizziness, speech difficulty, weakness and light-headedness.  Hematological: Negative for adenopathy. Does not bruise/bleed easily.  Psychiatric/Behavioral: Positive for decreased concentration. Negative for behavioral problems, confusion and sleep disturbance. The patient is not nervous/anxious.        But better with adderal.   Past Medical History:  Diagnosis Date  . Asthma   . History of chicken pox   . Pneumonia   . UTI (urinary tract infection)      Social History   Socioeconomic History  . Marital status: Divorced    Spouse name: Not on file  . Number of children: 1  . Years of education: Not on file  . Highest education level: Not on file  Social Needs  .  Financial resource strain: Not on file  . Food insecurity - worry: Not on file  . Food insecurity - inability: Not on file  . Transportation needs - medical: Not on file  . Transportation needs - non-medical: Not on file  Occupational History  . Occupation: Stay at home Mother    Employer: [employer]  Tobacco Use  . Smoking status: Never Smoker  . Smokeless tobacco: Never Used  Substance and Sexual Activity  . Alcohol use: No  . Drug use: No  . Sexual activity: Yes    Birth control/protection: IUD  Other Topics Concern  . Not on file  Social History Narrative  . Not on file    Past Surgical History:  Procedure Laterality Date  . CESAREAN SECTION  09/27/12    Family History  Problem Relation Age of Onset  . Allergies Sister   . Cancer Sister        fallopian tube  . Fibroids Mother        Malignant  . Diabetes Father   . Colon cancer Maternal Grandfather   . Diabetes Paternal Grandmother   . Liver cancer Paternal Aunt   . Colon polyps Neg Hx   . Esophageal cancer Neg Hx   . Stomach cancer Neg Hx   . Rectal cancer Neg Hx     Allergies  Allergen Reactions  . Aspirin Hives  . Levaquin [Levofloxacin In D5w] Hives  . Pamprin [Apap-Pamabrom-Pyrilamine] Hives  Current Outpatient Medications on File Prior to Visit  Medication Sig Dispense Refill  . albuterol (PROAIR HFA) 108 (90 Base) MCG/ACT inhaler INHALE 2 PUFFS BY MOUTH INTO THE LUNGS EVERY 6 HOURS AS NEEDED FOR WHEEZING 8.5 g 11  . benzonatate (TESSALON) 100 MG capsule Take 1 capsule (100 mg total) by mouth 3 (three) times daily as needed. 30 capsule 0  . budesonide-formoterol (SYMBICORT) 160-4.5 MCG/ACT inhaler Inhale 2 puffs into the lungs 2 (two) times daily. 1 Inhaler 0  . famotidine (PEPCID) 20 MG tablet Take 1 tablet (20 mg total) by mouth at bedtime. 30 tablet 11  . fluticasone (FLONASE) 50 MCG/ACT nasal spray INSTILL 2 SPRAYS INTO BOTH NOSTRILS DAILY 16 g 0  . montelukast (SINGULAIR) 10 MG  tablet TAKE 1 TABLET BY MOUTH AT BEDTIME 30 tablet 5  . Multiple Vitamins-Minerals (MULTIVITAMIN ADULT PO) Take 1 tablet by mouth every morning.    . predniSONE (DELTASONE) 20 MG tablet Take 2 tablets (40 mg total) by mouth daily with breakfast. 10 tablet 0   No current facility-administered medications on file prior to visit.     BP (!) 127/91 (BP Location: Left Arm, Patient Position: Sitting, Cuff Size: Large)   Pulse 95   Temp 98.1 F (36.7 C) (Oral)   Resp 16   Ht 5\' 5"  (1.651 m)   Wt 280 lb 6.4 oz (127.2 kg)   SpO2 96%   BMI 46.66 kg/m       Objective:   Physical Exam  General  Mental Status - Alert. General Appearance - Well groomed. Not in acute distress.  Skin Rashes- No Rashes.  HEENT Head- Normal. Ear Auditory Canal - Left- Normal. Right - Normal.Tympanic Membrane- Left- Normal. Right- Normal. Eye Sclera/Conjunctiva- Left- Normal. Right- Normal. Nose & Sinuses Nasal Mucosa- Left-  Boggy and Congested. Right-  Boggy and  Congested.Bilateral maxillary and frontal sinus pressure. Mouth & Throat Lips: Upper Lip- Normal: no dryness, cracking, pallor, cyanosis, or vesicular eruption. Lower Lip-Normal: no dryness, cracking, pallor, cyanosis or vesicular eruption. Buccal Mucosa- Bilateral- No Aphthous ulcers. Oropharynx- No Discharge or Erythema. Tonsils: Characteristics- Bilateral- No Erythema or Congestion. Size/Enlargement- Bilateral- No enlargement. Discharge- bilateral-None.  Neck Neck- Supple. No Masses.   Chest and Lung Exam Auscultation: Breath Sounds:-Clear even and unlabored. Upper lobe rough breath sounds. More rt side. Cardiovascular Auscultation:Rythm- Regular, rate and rhythm. Murmurs & Other Heart Sounds:Ausculatation of the heart reveal- No Murmurs.  Lymphatic Head & Neck General Head & Neck Lymphatics: Bilateral: Description- No Localized lymphadenopathy.       Assessment & Plan:  You appear to have bronchitis. Rest hydrate and tylenol  for fever. I am prescribing cough medicine hycodan , and azithromycin antibiotic. For your nasal congestion use your Flonase nasal spray.  For recent flare of wheezing, I want you to continue your Symbicort daily.  Refilling your albuterol inhaler and giving 5-day taper dose of prednisone.  For your ADD, you signed controlled medication contract today.  I refilled your Adderall.  You should gradually get better. If not then notify us and would recommend a chest xray.  Follow up in 7-10 days or as needed  Seiji Wiswell, Ramon DredgeEdward, [employer]PA-C

## 2017-11-16 LAB — HM PAP SMEAR: HM Pap smear: NEGATIVE

## 2017-12-09 ENCOUNTER — Other Ambulatory Visit: Payer: Self-pay | Admitting: Medical

## 2017-12-09 ENCOUNTER — Other Ambulatory Visit: Payer: Self-pay | Admitting: Internal Medicine

## 2017-12-15 ENCOUNTER — Encounter: Payer: Self-pay | Admitting: Medical

## 2017-12-15 MED ORDER — AMPHETAMINE-DEXTROAMPHET ER 30 MG PO CP24: 30.0000 mg | ORAL_CAPSULE | Freq: Every day | ORAL | 0 refills | Status: DC

## 2017-12-15 NOTE — Telephone Encounter (Signed)
Pt is requesting refill on Adderall. Will you refill in PCP absence?

## 2018-01-05 ENCOUNTER — Other Ambulatory Visit: Payer: Self-pay | Admitting: Medical

## 2018-01-05 ENCOUNTER — Other Ambulatory Visit: Payer: Self-pay | Admitting: Internal Medicine

## 2018-01-05 ENCOUNTER — Other Ambulatory Visit: Payer: Medicaid Other

## 2018-01-05 DIAGNOSIS — Z79899 Other long term (current) drug therapy: Secondary | ICD-10-CM

## 2018-01-05 MED ORDER — AMPHETAMINE-DEXTROAMPHET ER 30 MG PO CP24: 30.0000 mg | ORAL_CAPSULE | Freq: Every day | ORAL | 0 refills | Status: DC

## 2018-01-05 NOTE — Telephone Encounter (Signed)
Patient checking status, please advise °

## 2018-01-05 NOTE — Telephone Encounter (Signed)
Refill Request Adderall  Last RX:11/15/2017 Last OV:11/15/2017 Next XB:MWUXOV:None Scheduled  UDS:07/05/2017 CSC:10/28/16   [facility]

## 2018-01-05 NOTE — Telephone Encounter (Signed)
[facility] is [facility]

## 2018-01-06 NOTE — Telephone Encounter (Signed)
Patients prescription was sent to the wrong pharmacy. [facility]. Please advise. I have pended prescription.

## 2018-01-06 NOTE — Telephone Encounter (Signed)
It would not let me pend medication. I apologize.

## 2018-01-07 ENCOUNTER — Telehealth: Payer: Self-pay | Admitting: Medical

## 2018-01-07 NOTE — Telephone Encounter (Signed)
Just want to make sure the adderal rx sent to hospital was cancelled. Looks like Dr. Dallas Schimkeopeland had sent that in electronically. Then she came by and picked up rx I signed. I believe you told me you had that canceled. Please document that was done. [facility].

## 2018-01-09 ENCOUNTER — Other Ambulatory Visit: Payer: Self-pay | Admitting: Internal Medicine

## 2018-01-09 LAB — PAIN MGMT, PROFILE 8 W/CONF, U
6 ACETYLMORPHINE: NEGATIVE ng/mL (ref <10)
ALCOHOL METABOLITES: NEGATIVE ng/mL (ref <500)
AMPHETAMINE: 1842 ng/mL — AB (ref <250)
Amphetamines: POSITIVE ng/mL — AB (ref <500)
BENZODIAZEPINES: NEGATIVE ng/mL (ref <100)
Buprenorphine, Urine: NEGATIVE ng/mL (ref <5)
COCAINE METABOLITE: NEGATIVE ng/mL (ref <150)
Creatinine: 182.2 mg/dL
MARIJUANA METABOLITE: NEGATIVE ng/mL (ref <20)
MDMA: NEGATIVE ng/mL (ref <500)
Methamphetamine: NEGATIVE ng/mL (ref <250)
OPIATES: NEGATIVE ng/mL (ref <100)
Oxidant: NEGATIVE ug/mL (ref <200)
Oxycodone: NEGATIVE ng/mL (ref <100)
pH: 5.65 (ref 4.5–9.0)

## 2018-01-12 ENCOUNTER — Encounter: Payer: Self-pay | Admitting: Medical

## 2018-01-12 ENCOUNTER — Ambulatory Visit (INDEPENDENT_AMBULATORY_CARE_PROVIDER_SITE_OTHER): Payer: Medicaid Other | Admitting: Medical

## 2018-01-12 VITALS — BP 129/53 | HR 106 | Temp 98.2°F | Resp 16 | Ht 65.0 in | Wt 275.2 lb

## 2018-01-12 DIAGNOSIS — J3489 Other specified disorders of nose and nasal sinuses: Secondary | ICD-10-CM | POA: Diagnosis not present

## 2018-01-12 DIAGNOSIS — J029 Acute pharyngitis, unspecified: Secondary | ICD-10-CM | POA: Diagnosis not present

## 2018-01-12 DIAGNOSIS — R05 Cough: Secondary | ICD-10-CM

## 2018-01-12 DIAGNOSIS — R0981 Nasal congestion: Secondary | ICD-10-CM | POA: Diagnosis not present

## 2018-01-12 DIAGNOSIS — R062 Wheezing: Secondary | ICD-10-CM

## 2018-01-12 DIAGNOSIS — R059 Cough, unspecified: Secondary | ICD-10-CM

## 2018-01-12 DIAGNOSIS — J069 Acute upper respiratory infection, unspecified: Secondary | ICD-10-CM | POA: Diagnosis not present

## 2018-01-12 MED ORDER — BENZONATATE 100 MG PO CAPS: 100.0000 mg | ORAL_CAPSULE | Freq: Three times a day (TID) | ORAL | 0 refills | Status: DC | PRN

## 2018-01-12 MED ORDER — PREDNISONE 10 MG PO TABS: 10.0000 mg | ORAL_TABLET | Freq: Every day | ORAL | 0 refills | Status: DC

## 2018-01-12 MED ORDER — DOXYCYCLINE HYCLATE 100 MG PO TABS: 100.0000 mg | ORAL_TABLET | Freq: Two times a day (BID) | ORAL | 0 refills | Status: DC

## 2018-01-12 NOTE — Progress Notes (Signed)
Subjective:    Patient ID: [name], female    DOB: Aug 23, 1981, 37 y.o.   MRN: [id number]  HPI  Pt in with recent cough that worse since January  17 th. Pt also has nasal congestion, mild sinus pressure and some pnd. Mild st. Some blood tinged mucus at time when coughs.  Pt does use flonase and she wonders if working [employer] since she can feel pnd. Wonders if needs other spray.  Pt has asthma. Pt in December did get tapered prednisone. Did resolve her symptoms briefly. Pt states Dr Sherene SiresWert has suggested maybe referring to allergist.  But then patient states Dr. Sherene SiresWert also does allergy testing and he already started the process.  She wants to stay with Dr. Sherene SiresWert pt has allergies likley to animal dander. Pt wants to wait till June before being seen. Pt is on singulair. Flonase, sybicort and albuterol  LMP-mirena.  Review of Systems  Constitutional: Negative for chills, fatigue and fever.  HENT: Positive for congestion, postnasal drip, sinus pressure, sinus pain and sore throat. Negative for ear discharge, ear pain and facial swelling.   Respiratory: Positive for cough.   Cardiovascular: Negative for chest pain and palpitations.  Gastrointestinal: Negative for abdominal pain, blood in stool, constipation, diarrhea, nausea and vomiting.  Musculoskeletal: Negative for back pain, joint swelling, myalgias and neck stiffness.  Skin: Negative for rash.  Neurological: Negative for dizziness, seizures, speech difficulty, weakness, light-headedness, numbness and headaches.  Hematological: Negative for adenopathy. Does not bruise/bleed easily.  Psychiatric/Behavioral: Negative for behavioral problems and confusion.    Past Medical History:  Diagnosis Date  . Asthma   . History of chicken pox   . Pneumonia   . UTI (urinary tract infection)      Social History   Socioeconomic History  . Marital status: Divorced    Spouse name: Not on file  . Number of children: 1  . Years of  education: Not on file  . Highest education level: Not on file  Social Needs  . Financial resource strain: Not on file  . Food insecurity - worry: Not on file  . Food insecurity - inability: Not on file  . Transportation needs - medical: Not on file  . Transportation needs - non-medical: Not on file  Occupational History  . Occupation: Stay at home Mother    Employer: [employer]  Tobacco Use  . Smoking status: Never Smoker  . Smokeless tobacco: Never Used  Substance and Sexual Activity  . Alcohol use: No  . Drug use: No  . Sexual activity: Yes    Birth control/protection: IUD  Other Topics Concern  . Not on file  Social History Narrative  . Not on file    Past Surgical History:  Procedure Laterality Date  . CESAREAN SECTION  09/27/12    Family History  Problem Relation Age of Onset  . Allergies Sister   . Cancer Sister        fallopian tube  . Fibroids Mother        Malignant  . Diabetes Father   . Colon cancer Maternal Grandfather   . Diabetes Paternal Grandmother   . Liver cancer Paternal Aunt   . Colon polyps Neg Hx   . Esophageal cancer Neg Hx   . Stomach cancer Neg Hx   . Rectal cancer Neg Hx     Allergies  Allergen Reactions  . Aspirin Hives  . Levaquin [Levofloxacin In D5w] Hives  . Pamprin [Apap-Pamabrom-Pyrilamine] Hives  Current Outpatient Medications on File Prior to Visit  Medication Sig Dispense Refill  . albuterol (PROAIR HFA) 108 (90 Base) MCG/ACT inhaler INHALE 2 PUFFS BY MOUTH INTO THE LUNGS EVERY 6 HOURS AS NEEDED FOR WHEEZING 8.5 g 11  . amphetamine-dextroamphetamine (ADDERALL XR) 30 MG 24 hr capsule Take 1 capsule (30 mg total) by mouth daily. 30 capsule 0  . azithromycin (ZITHROMAX) 250 MG tablet Take 2 tablets by mouth on day 1, followed by 1 tablet by mouth daily for 4 days. 6 tablet 0  . benzonatate (TESSALON) 100 MG capsule Take 1 capsule (100 mg total) by mouth 3 (three) times daily as needed. 30 capsule 0  .  budesonide-formoterol (SYMBICORT) 160-4.5 MCG/ACT inhaler Inhale 2 puffs into the lungs 2 (two) times daily. 1 Inhaler 0  . famotidine (PEPCID) 20 MG tablet Take 1 tablet (20 mg total) by mouth at bedtime. 30 tablet 11  . fluticasone (FLONASE) 50 MCG/ACT nasal spray INSTILL 2 SPRAYS INTO BOTH NOSTRILS DAILY 16 g 0  . HYDROcodone-homatropine (HYCODAN) 5-1.5 MG/5ML syrup Take 5 mLs by mouth every 8 (eight) hours as needed for cough. 100 mL 0  . montelukast (SINGULAIR) 10 MG tablet TAKE 1 TABLET BY MOUTH AT BEDTIME 30 tablet 5  . Multiple Vitamins-Minerals (MULTIVITAMIN ADULT PO) Take 1 tablet by mouth every morning.    Marland Kitchen omeprazole (PRILOSEC) 40 MG capsule Take 1 capsule (40 mg total) by mouth daily. 30 capsule 11  . predniSONE (DELTASONE) 10 MG tablet 5 TAB PO DAY 1 4 TAB PO DAY 2 3 TAB PO DAY 3 2 TAB PO DAY 4 1 TAB PO DAY 5 15 tablet 0  . SYMBICORT 160-4.5 MCG/ACT inhaler INHALE 2 PUFFS INTO THE LUNGS 2 (TWO) TIMES DAILY. 10.2 g 11   No current facility-administered medications on file prior to visit.     BP (!) 129/53   Pulse (!) 106   Temp 98.2 F (36.8 C) (Oral)   Resp 16   Ht 5\' 5"  (1.651 m)   Wt 275 lb 3.2 oz (124.8 kg)   SpO2 96%   BMI 45.80 kg/m       Objective:   Physical Exam  General  Mental Status - Alert. General Appearance - Well groomed. Not in acute distress.  Skin Rashes- No Rashes.  HEENT Head- Normal. Ear Auditory Canal - Left- Normal. Right - Normal.Tympanic Membrane- Left- Normal. Right- Normal. Eye Sclera/Conjunctiva- Left- Normal. Right- Normal. Nose & Sinuses Nasal Mucosa- Left-  Boggy and Congested. Right-  Boggy and  Congested.Bilateral maxillary and frontal sinus pressure. Mouth & Throat Lips: Upper Lip- Normal: no dryness, cracking, pallor, cyanosis, or vesicular eruption. Lower Lip-Normal: no dryness, cracking, pallor, cyanosis or vesicular eruption. Buccal Mucosa- Bilateral- No Aphthous ulcers. Oropharynx- No Discharge or Erythema.  +pnd. Tonsils: Characteristics- Bilateral- No Erythema or Congestion. Size/Enlargement- Bilateral- No enlargement. Discharge- bilateral-None.  Neck Neck- Supple. No Masses.   Chest and Lung Exam Auscultation: Breath Sounds:-even and unlabored. Scattered wheezing.  Cardiovascular Auscultation:Rythm- Regular, rate and rhythm. Murmurs & Other Heart Sounds:Ausculatation of the heart reveal- No Murmurs.  Lymphatic Head & Neck General Head & Neck Lymphatics: Bilateral: Description- No Localized lymphadenopathy.       Assessment & Plan:   You appear to have bronchitis and sinusitis. Rest hydrate and tylenol for fever. I am prescribing cough medicine benzonatate, and antibiotic. For your nasal congestion continue flonae nasal steroid.   Might consider astelin in future but would need to clarify with Dr. Sherene Sires or pharmacy  if pamprin allergy prohibits?  For wheezing taper prednisone. But recommend that you bump up your pulmonologist appointment so you can start immunotherapy.  You should gradually get better. If not then notify us and would recommend a chest xray.  Follow up in 7-10 days or as needed  [place], Ramon Dredge, [employer]

## 2018-01-12 NOTE — Patient Instructions (Addendum)
You appear to have bronchitis and sinusitis. Rest hydrate and tylenol for fever. I am prescribing cough medicine benzonatate, and doxycyline antibiotic. For your nasal congestion continue flonae nasal steroid.   Might consider astelin in future but would need to clarify with Dr. Sherene SiresWert or pharmacy if pamprin allergy prohibits?(per epic?)  For wheezing taper prednisone. But recommend that you bump up/get in sooner with your pulmonologist appointment so you can start immunotherapy.  You should gradually get better. If not then notify us and would recommend a chest xray.  Follow up in 7-10 days or as needed

## 2018-01-13 ENCOUNTER — Telehealth: Payer: Self-pay | Admitting: Medical

## 2018-01-13 MED ORDER — AZELASTINE HCL 0.1 % NA SOLN: 2.0000 | Freq: Two times a day (BID) | NASAL | 1 refills | Status: DC

## 2018-01-13 NOTE — Telephone Encounter (Signed)
Rx of Astelin written at patient's request after she talked with pharmacist about possible Astelin allergy with history of allergy to pamprin.  Per patient pharmacist states not a problem and she wants me to write that prescription.

## 2018-02-08 ENCOUNTER — Other Ambulatory Visit: Payer: Self-pay | Admitting: Medical

## 2018-02-08 ENCOUNTER — Telehealth: Payer: Self-pay | Admitting: Medical

## 2018-02-08 NOTE — Telephone Encounter (Signed)
Copied from CRM [id number]. Topic: Quick Communication - Rx Refill/Question >> Feb 08, 2018  2:15 PM Oneal GroutSebastian, Jennifer S wrote: Medication:  amphetamine-dextroamphetamine (ADDERALL XR) 30 MG 24 hr capsule   Has the patient contacted their pharmacy? Yes.     (Agent: If no, request that the patient contact the pharmacy for the refill.)   Preferred Pharmacy (with phone number or street name): [facility], [phone]   Agent: Please be advised that RX refills may take up to 3 business days. We ask that you follow-up with your pharmacy.   Patient told her last pill today, needs filled today

## 2018-02-08 NOTE — Telephone Encounter (Signed)
Refill of Adderall  LOV  01/12/18  LRF  01/05/18   #30   0 refills  [facility], [phone]

## 2018-02-09 MED ORDER — AMPHETAMINE-DEXTROAMPHET ER 30 MG PO CP24: 30.0000 mg | ORAL_CAPSULE | Freq: Every day | ORAL | 0 refills | Status: DC

## 2018-02-09 NOTE — Telephone Encounter (Signed)
I will sign the prescription.  But remind her she needs to have a follow-up appointment at the end of April.  Controlled medication visit follow-up.

## 2018-02-09 NOTE — Telephone Encounter (Signed)
Refill Request: Adderall  Last RX:01/05/2018 Last OV:02/12/2018 Next ZO:XWRUOV:none scheduled  UDS:01/05/2018 CSC:01/05/2018

## 2018-02-10 NOTE — Telephone Encounter (Signed)
Pt notified rx ready for pick up.

## 2018-03-14 ENCOUNTER — Other Ambulatory Visit: Payer: Self-pay | Admitting: Medical

## 2018-03-14 ENCOUNTER — Encounter: Payer: Self-pay | Admitting: Medical

## 2018-03-14 NOTE — Telephone Encounter (Signed)
Copied from CRM [id number]. Topic: Quick Communication - Rx Refill/Question >> Mar 14, 2018  8:10 AM Caryn SectionMorris, Matelyn Antonelli E, NT wrote: Medication: amphetamine-dextroamphetamine (ADDERALL XR) 30 MG 24 hr capsule Has the patient contacted their pharmacy? Yes  (Agent: If no, request that the patient contact the pharmacy for the refill.) Preferred Pharmacy (with phone number or street name): [facility] [place] - [address] [phone] (Phone) [phone] (Fax)     Agent: Please be advised that RX refills may take up to 3 business days. We ask that you follow-up with your pharmacy.

## 2018-03-14 NOTE — Telephone Encounter (Signed)
LOV:11/15/17  Edwena BundeEdward Saguier,PA  [facility]

## 2018-03-16 MED ORDER — AMPHETAMINE-DEXTROAMPHET ER 30 MG PO CP24: 30.0000 mg | ORAL_CAPSULE | Freq: Every day | ORAL | 0 refills | Status: DC

## 2018-03-16 NOTE — Telephone Encounter (Signed)
I did send prescription of Adderall to patient's pharmacy.  Please let her know she is due for controlled medication/ADD visit next month.

## 2018-03-16 NOTE — Telephone Encounter (Signed)
Refill Request: Adderall  Last RX:02/09/2018 Last OV:01/12/2017 Next ZO:XWRUOV:None scheduled  UDS:01/05/2018 CSC:01/05/2018

## 2018-03-16 NOTE — Telephone Encounter (Signed)
Sent patient mychart message informing her to schedule follow up

## 2018-04-10 ENCOUNTER — Encounter: Payer: Self-pay | Admitting: Internal Medicine

## 2018-04-10 ENCOUNTER — Ambulatory Visit (INDEPENDENT_AMBULATORY_CARE_PROVIDER_SITE_OTHER): Payer: Managed Care, Other (non HMO) | Admitting: Internal Medicine

## 2018-04-10 VITALS — BP 122/88 | HR 124 | Temp 98.7°F | Ht 65.0 in | Wt 268.8 lb

## 2018-04-10 DIAGNOSIS — J4541 Moderate persistent asthma with (acute) exacerbation: Secondary | ICD-10-CM

## 2018-04-10 MED ORDER — PREDNISONE 10 MG PO TABS: ORAL_TABLET | ORAL | 0 refills | Status: DC

## 2018-04-10 MED ORDER — AZITHROMYCIN 250 MG PO TABS: ORAL_TABLET | ORAL | 0 refills | Status: DC

## 2018-04-10 NOTE — Assessment & Plan Note (Signed)
-    Allergy profile   12/25/2012 > Eos 10%,  Ig E  222 Pos dogs/ cats/grass  - referred to allergy 4/11/6 > cancelled as much improved on symb/dysmista - FENO 06/23/2016  =   32 on symb 160 2bid > no changes needed  - 08/09/2017   change to symb 160 2bid  04/10/2018  After extensive coaching inhaler device  effectiveness =    90% with baseline 75%   Acute flare in setting of uri vs allergic mech   rec  zpak Prednisone 10 mg take  4 each am x 2 days,   2 each am x 2 days,  1 each am x 2 days and stop  F/u in 4 weeks, sooner prn    I had an extended discussion with the patient reviewing all relevant studies completed to date and  lasting 15 to 20 minutes of a 25 minute acute office  visit    Each maintenance medication was reviewed in detail including most importantly the difference between maintenance and prns and under what circumstances the prns are to be triggered using an action plan format that is not reflected in the computer generated alphabetically organized AVS.    Please see AVS for specific instructions unique to this visit that I personally wrote and verbalized to the the pt in detail and then reviewed with pt  by my nurse highlighting any  changes in therapy recommended at today's visit to their plan of care.

## 2018-04-10 NOTE — Progress Notes (Addendum)
Subjective:    Patient ID: [name], female    DOB: 1981/10/11   MRN: [id number]    Brief patient profile:  37  yowf never smoker dx with asthma age 37 with good control including nl pregnancy while on advair 250 in 2007     History of Present Illness  08/17/2011 Initial pulmonary office eval in EMR era cc asthma worse since 2009 p mold exposure thru Feb 2012 when left that  House> moved to [place] p admitted to [facility] Feb 2012 and discharged initially on advair for did ok for  several weeks while on prednisone then downhill since > sev ER trips and has developed neb dependence -Tried symbicort and dulera through urgent.  Took 2 puffs of dulera am of ov but still needed neb 2hours later. rec Dulera 100 Take 2 puffs first thing in am and then another 2 puffs about 12 hours later.  Work on [employer], commercial:   Prednisone 10 mg take  4 each am x 2 days,   2 each am x 2 days,  1 each am x2days and stop  If can't catch your breath ok to ventolin up to 2 puffs and only if this doesn't work ok to use the nebulizer but stop using the spacer Prilosec   Take 30-60 min before first meal of the day and Pepcid 20 mg one bedtime         Admit date: 11/26/2012  Discharge date: 11/29/2012  Discharge Diagnoses:   ASTHMA, WITH ACUTE EXACERBATION   Asthmatic bronchitis  Hypokalemia  URI, acute  Leukocytosis  Dehydration  Sinus tachycardia  12/06/2012 post hosp f/u ov/[name]  back to baseline x still using too much saba "the way she was in the hospital" with minimal residual nasal and ear congestion, no purulent sputum, almost all done with prednisone. rec Plan A Dulera 100 Take 2 puffs first thing in am and then another 2 puffs about 12 hours later.  Only use your albuterol (xopenex inhaler Plan B,  xopenex neb is plan C)  as a rescue medication  GERD diet  12/25/2012 f/u ov/[name] cc some better then  C/o gradual increased SOB and wheezing x 2 wks, worse for the past  couple days. Still has prod cough with dark green sputum since the last visit here. In retrospect never cleared nasal congestion and not using xopenex at all then started using hfa daytime and neb at night.  rec Augmentin 875 twice daily x 10 days and if head congestion not resolved completely need a sinus CT > [name] Plan A:  Dulera 200 Take 2 puffs first thing in am and then another 2 puffs about 12 hours later.  Only use your albuterol (xopenex inhaler Plan B,  xopenex neb is plan C)     08/09/2017  Extended f/u ov/[name] re: re-establish care p flare/ UC eval   maint on gerd rx and symb 80 2bid/ singulair  Chief Complaint  Patient presents with  . Follow-up    Pt states breathing has been worse off and on since symbicort was lowered.  She is using her albuterol inhaler 1-2 x per day. She states had asthma flare and had to go to [facility] 08/07/17 and was put on pred. She is coughing up some green sputum.   fine x 3 months on flonase/ symb 80  then pna oct 2017 needing more albuterol but better again march 2018- early August 2018 rare need for albuterol and  07/31/17 UC for flare > prednisone rx  Cough and wheezing and discolored sputum all improving on prednisone and thinks the 80 strength is not adequate rec Plan A = Automatic = change to symbicort 160 Take 2 puffs first thing in am and then another 2 puffs about 12 hours later.  Work on maintaining perfect  inhaler technique:    Plan B = Backup Only use your albuterol as a rescue medication Plan C = Crisis - only use your albuterol nebulizer if you first try Plan B and it fails to help > ok to use the nebulizer up to every 4 hours but if start needing it regularly call for immediate appointment    04/10/2018 acute extended ov/[name] re: acute exac x 5 days  Chief Complaint  Patient presents with  . Acute Visit    Increased SOB and prod cough with dark green sputum over the past 3 days. She is also having some chest tightness.   One  course of prednisone p xmas and rare need for albuterol since then on symb 160 2bid/ singulair Acutely ill x 04/06/18 with nasal congestion and cough and then worse sob/chest tight with green mucus since 04/07/18 Still not using neb but using saba several times a day and at least once noct and helps Admits not using symb 160 2 q 12 h consistently    No obvious day to day or daytime variability or assoc mucus plugs or hemoptysis or cp or  subjective wheeze or overt sinus or hb symptoms. No unusual exposure hx or h/o childhood pna  or knowledge of premature birth.   . Also denies any obvious fluctuation of symptoms with weather or environmental changes or other aggravating or alleviating factors except as outlined above   Current Allergies, Complete Past Medical History, Past Surgical History, Family History, and Social History were reviewed in [employer] record.  ROS  The following are not active complaints unless bolded Hoarseness, sore throat, dysphagia, dental problems, itching, sneezing,  nasal congestion or discharge of excess mucus or purulent secretions, ear ache,   fever, chills, sweats, unintended wt loss or wt gain, classically pleuritic or exertional cp,  orthopnea pnd or arm/hand swelling  or leg swelling, presyncope, palpitations, abdominal pain, anorexia, nausea, vomiting, diarrhea  or change in bowel habits or change in bladder habits, change in stools or change in urine, dysuria, hematuria,  rash, arthralgias, visual complaints, headache, numbness, weakness or ataxia or problems with walking or coordination,  change in mood or  memory.        Current Meds  Medication Sig  . albuterol (PROAIR HFA) 108 (90 Base) MCG/ACT inhaler INHALE 2 PUFFS BY MOUTH INTO THE LUNGS EVERY 6 HOURS AS NEEDED FOR WHEEZING  . amphetamine-dextroamphetamine (ADDERALL XR) 30 MG 24 hr capsule Take 1 capsule (30 mg total) by mouth daily.  . budesonide-formoterol (SYMBICORT) 160-4.5  MCG/ACT inhaler Inhale 2 puffs into the lungs 2 (two) times daily.  . famotidine (PEPCID) 20 MG tablet TAKE 1 TABLET (20 MG TOTAL) BY MOUTH AT BEDTIME.  . fluticasone (FLONASE) 50 MCG/ACT nasal spray PLACE 2 SPRAYS INTO BOTH NOSTRILS ONCE DAILY  . montelukast (SINGULAIR) 10 MG tablet TAKE 1 TABLET BY MOUTH AT BEDTIME  . Multiple Vitamins-Minerals (MULTIVITAMIN ADULT PO) Take 1 tablet by mouth every morning.  Marland Kitchen omeprazole (PRILOSEC) 40 MG capsule Take 1 capsule (40 mg total) by mouth daily.            Objective:   Physical  Exam  08/17/2011  245 > 234 12/06/2012  > 234 12/25/2012 >  248 06/25/2013 > 10/03/2013  252 > 12/03/2013 263> 09/30/2014 288 > 03/31/2015   280  >  06/23/2016   259  > 08/09/2017   274 > 04/10/2018   268   amb wf nad   Vital signs reviewed - Note on arrival 02 sats  92% on RA    HEENT: nl dentition,  and oropharynx. Nl external ear canals without cough reflex - moderate bilateral non-specific turbinate edema  s purulent secretions    NECK :  without JVD/Nodes/TM/ nl carotid upstrokes bilaterally   LUNGS: no acc muscle use,  Nl contour chest with bilateral insp/exp wheeze better with plm / cough at end exp   CV:  RRR  no s3 or murmur or increase in P2, and no edema   ABD:  soft and nontender with nl inspiratory excursion in the supine position. No bruits or organomegaly appreciated, bowel sounds nl  MS:  Nl gait/ ext warm without deformities, calf tenderness, cyanosis or clubbing No obvious joint restrictions   SKIN: warm and dry without lesions    NEURO:  alert, approp, nl sensorium with  no motor or cerebellar deficits apparent.            Assessment & Plan:

## 2018-04-10 NOTE — Patient Instructions (Addendum)
Prednisone 10 mg take  4 each am x 2 days,   2 each am x 2 days,  1 each am x 2 days and stop   zpak called in    Please schedule a follow up office visit in 4 weeks, sooner if needed

## 2018-05-11 ENCOUNTER — Encounter: Payer: Self-pay | Admitting: Internal Medicine

## 2018-05-11 ENCOUNTER — Ambulatory Visit (INDEPENDENT_AMBULATORY_CARE_PROVIDER_SITE_OTHER): Payer: Managed Care, Other (non HMO) | Admitting: Internal Medicine

## 2018-05-11 VITALS — BP 142/88 | HR 97 | Ht 65.0 in | Wt 275.0 lb

## 2018-05-11 DIAGNOSIS — J310 Chronic rhinitis: Secondary | ICD-10-CM | POA: Diagnosis not present

## 2018-05-11 DIAGNOSIS — J4530 Mild persistent asthma, uncomplicated: Secondary | ICD-10-CM

## 2018-05-11 MED ORDER — AMOXICILLIN-POT CLAVULANATE 875-125 MG PO TABS: 1.0000 | ORAL_TABLET | Freq: Two times a day (BID) | ORAL | 0 refills | Status: AC

## 2018-05-11 MED ORDER — MONTELUKAST SODIUM 10 MG PO TABS: 10.0000 mg | ORAL_TABLET | Freq: Every day | ORAL | 11 refills | Status: DC

## 2018-05-11 MED ORDER — AZELASTINE-FLUTICASONE 137-50 MCG/ACT NA SUSP: 1.0000 | Freq: Two times a day (BID) | NASAL | 11 refills | Status: DC

## 2018-05-11 NOTE — Progress Notes (Signed)
Subjective:    Patient ID: [name], female    DOB: 1981/10/11   MRN: [id number]    Brief patient profile:  37  yowf never smoker dx with asthma age 37 with good control including nl pregnancy while on advair 250 in 2007     History of Present Illness  08/17/2011 Initial pulmonary office eval in EMR era cc asthma worse since 2009 p mold exposure thru Feb 2012 when left that  House> moved to [place] p admitted to [facility] Feb 2012 and discharged initially on advair for did ok for  several weeks while on prednisone then downhill since > sev ER trips and has developed neb dependence -Tried symbicort and dulera through urgent.  Took 2 puffs of dulera am of ov but still needed neb 2hours later. rec Dulera 100 Take 2 puffs first thing in am and then another 2 puffs about 12 hours later.  Work on [employer], commercial:   Prednisone 10 mg take  4 each am x 2 days,   2 each am x 2 days,  1 each am x2days and stop  If can't catch your breath ok to ventolin up to 2 puffs and only if this doesn't work ok to use the nebulizer but stop using the spacer Prilosec   Take 30-60 min before first meal of the day and Pepcid 20 mg one bedtime         Admit date: 11/26/2012  Discharge date: 11/29/2012  Discharge Diagnoses:   ASTHMA, WITH ACUTE EXACERBATION   Asthmatic bronchitis  Hypokalemia  URI, acute  Leukocytosis  Dehydration  Sinus tachycardia  12/06/2012 post hosp f/u ov/[name]  back to baseline x still using too much saba "the way she was in the hospital" with minimal residual nasal and ear congestion, no purulent sputum, almost all done with prednisone. rec Plan A Dulera 100 Take 2 puffs first thing in am and then another 2 puffs about 12 hours later.  Only use your albuterol (xopenex inhaler Plan B,  xopenex neb is plan C)  as a rescue medication  GERD diet  12/25/2012 f/u ov/[name] cc some better then  C/o gradual increased SOB and wheezing x 2 wks, worse for the past  couple days. Still has prod cough with dark green sputum since the last visit here. In retrospect never cleared nasal congestion and not using xopenex at all then started using hfa daytime and neb at night.  rec Augmentin 875 twice daily x 10 days and if head congestion not resolved completely need a sinus CT > [name] Plan A:  Dulera 200 Take 2 puffs first thing in am and then another 2 puffs about 12 hours later.  Only use your albuterol (xopenex inhaler Plan B,  xopenex neb is plan C)     08/09/2017  Extended f/u ov/[name] re: re-establish care p flare/ UC eval   maint on gerd rx and symb 80 2bid/ singulair  Chief Complaint  Patient presents with  . Follow-up    Pt states breathing has been worse off and on since symbicort was lowered.  She is using her albuterol inhaler 1-2 x per day. She states had asthma flare and had to go to [facility] 08/07/17 and was put on pred. She is coughing up some green sputum.   fine x 3 months on flonase/ symb 80  then pna oct 2017 needing more albuterol but better again march 2018- early August 2018 rare need for albuterol and  07/31/17 UC for flare > prednisone rx  Cough and wheezing and discolored sputum all improving on prednisone and thinks the 80 strength is not adequate rec Plan A = Automatic = change to symbicort 160 Take 2 puffs first thing in am and then another 2 puffs about 12 hours later.  Work on maintaining perfect  inhaler technique:    Plan B = Backup Only use your albuterol as a rescue medication Plan C = Crisis - only use your albuterol nebulizer if you first try Plan B and it fails to help > ok to use the nebulizer up to every 4 hours but if start needing it regularly call for immediate appointment     04/10/2018 acute extended ov/[name] re: acute exac x 5 days  Chief Complaint  Patient presents with  . Acute Visit    Increased SOB and prod cough with dark green sputum over the past 3 days. She is also having some chest tightness.   One  course of prednisone p xmas and rare need for albuterol since then on symb 160 2bid/ singulair Acutely ill x 04/06/18 with nasal congestion and cough and then worse sob/chest tight with green mucus since 04/07/18 Still not using neb but using saba several times a day and at least once noct and helps Admits not using symb 160 2 q 12 h consistently  rec Prednisone 10 mg take  4 each am x 2 days,   2 each am x 2 days,  1 each am x 2 days and stop  zpak called in   05/11/2018  f/u ov/[name] re:  Extrinsic asthma with refractory nasal symptoms  On flonase/singulair  Chief Complaint  Patient presents with  . Follow-up    Breathing is overall doing well. She still c/o nasal congestion and stopped up ear. She is using her albuterol inhaler 4 x per wk on average.   Dyspnea:  Not limited by breathing from desired activities   Cough: some green mucus esp in ams Sleep: fine flat SABA use:  When walks long distance   Nose/ L ear still eel  clogged up has not tried decongestants   No obvious day to day or daytime variability or assoc excess/ purulent sputum or mucus plugs or hemoptysis or cp or chest tightness, subjective wheeze or overt  hb symptoms. No unusual exposure hx or h/o childhood pna/ asthma or knowledge of premature birth.  Sleeping  Fine flat  without nocturnal  or  exacerbation  of respiratory  c/o's or need for noct saba. Also denies any obvious fluctuation of symptoms with weather or environmental changes or other aggravating or alleviating factors except as outlined above   Current Allergies, Complete Past Medical History, Past Surgical History, Family History, and Social History were reviewed in [employer] record.  ROS  The following are not active complaints unless bolded Hoarseness, sore throat, dysphagia, dental problems, itching, sneezing,  nasal congestion or discharge of excess mucus or purulent secretions, L ear ache,   fever, chills, sweats, unintended wt  loss or wt gain, classically pleuritic or exertional cp,  orthopnea pnd or arm/hand swelling  or leg swelling, presyncope, palpitations, abdominal pain, anorexia, nausea, vomiting, diarrhea  or change in bowel habits or change in bladder habits, change in stools or change in urine, dysuria, hematuria,  rash, arthralgias, visual complaints, headache, numbness, weakness or ataxia or problems with walking or coordination,  change in mood or  memory.  Current Meds  Medication Sig  . albuterol (PROAIR HFA) 108 (90 Base) MCG/ACT inhaler INHALE 2 PUFFS BY MOUTH INTO THE LUNGS EVERY 6 HOURS AS NEEDED FOR WHEEZING  . budesonide-formoterol (SYMBICORT) 160-4.5 MCG/ACT inhaler Inhale 2 puffs into the lungs 2 (two) times daily.  . famotidine (PEPCID) 20 MG tablet TAKE 1 TABLET (20 MG TOTAL) BY MOUTH AT BEDTIME.  . montelukast (SINGULAIR) 10 MG tablet Take 1 tablet (10 mg total) by mouth at bedtime.  . Multiple Vitamins-Minerals (MULTIVITAMIN ADULT PO) Take 1 tablet by mouth every morning.  Marland Kitchen omeprazole (PRILOSEC) 40 MG capsule Take 1 capsule (40 mg total) by mouth daily.  . [  fluticasone (FLONASE) 50 MCG/ACT nasal spray PLACE 2 SPRAYS INTO BOTH NOSTRILS ONCE DAILY  .                          Objective:   Physical Exam  08/17/2011  245 > 234 12/06/2012  > 234 12/25/2012 >  248 06/25/2013 > 10/03/2013  252 > 12/03/2013 263> 09/30/2014 288 > 03/31/2015   280  >  06/23/2016   259  > 08/09/2017   274 > 04/10/2018   268 > 05/11/2018  275   Obese amb wf nad   Vital signs reviewed - Note on arrival 02 sats  97% on RA       HEENT: nl dentition,  and oropharynx. Nl external ear canals without cough reflex - moderatetlymoderate bilateral non-specific turbinate edema  s purulent secretions identified    NECK :  without JVD/Nodes/TM/ nl carotid upstrokes bilaterally   LUNGS: no acc muscle use,  Nl contour chest which is clear to A and P bilaterally without cough on insp or exp maneuvers   CV:  RRR   no s3 or murmur or increase in P2, and no edema   ABD:  Quite obese but soft and nontender with nl inspiratory excursion in the supine position. No bruits or organomegaly appreciated, bowel sounds nl  MS:  Nl gait/ ext warm without deformities, calf tenderness, cyanosis or clubbing No obvious joint restrictions   SKIN: warm and dry without lesions    NEURO:  alert, approp, nl sensorium with  no motor or cerebellar deficits apparent.                 Assessment & Plan:

## 2018-05-11 NOTE — Patient Instructions (Addendum)
Augmentin 875 mg take one pill twice daily  X 10 days - take at breakfast and supper with large glass of water.  It would help reduce the usual side effects (diarrhea and yeast infections) if you ate cultured yogurt at lunch.   Nasal steroids have no immediate benefit in terms of improving symptoms.  To help them reached the target tissue, the patient should use Afrin two puffs every 12 hours applied one min before using the nasal steroids.  Afrin should be stopped after no more than 5 days.  If the symptoms worsen, Afrin can be restarted after 5 days off of therapy to prevent rebound congestion from overuse of Afrin.  I also emphasized that in no way are nasal steroids a concern in terms of "addiction".   Try dymista one twice daily as the "nasal steroid" and if works but can't afford it we will call you in the generic components  If above not effective then call [phone] for sinus CT scheduling    Please schedule a follow up visit in 3 months but call sooner if needed

## 2018-05-12 ENCOUNTER — Encounter: Payer: Self-pay | Admitting: Internal Medicine

## 2018-05-12 NOTE — Assessment & Plan Note (Signed)
Body mass index is 45.76 kg/m.  -  trending up  Lab Results  Component Value Date   TSH 1.96 03/02/2017     Contributing to gerd risk/ doe(which is hard to separate from asthma component but strongly suggested here as proportionate to activity s variability) reviewed the need and the process to achieve and maintain neg calorie balance > defer f/u primary care including intermittently monitoring thyroid status

## 2018-05-12 NOTE — Assessment & Plan Note (Signed)
-    Allergy profile   12/25/2012 > Eos 10%,  Ig E  222 Pos dogs/ cats/grass  - referred to allergy 4/11/6 > cancelled as much improved on symb/dysmista - FENO 06/23/2016  =   32 on symb 160 2bid > no changes needed  - 08/09/2017   change to symb 160 2bid   - 05/11/2018  After extensive coaching inhaler device  effectiveness =    90%    All goals of chronic asthma control met including optimal function and elimination of symptoms with minimal need for rescue therapy.  Contingencies discussed in full including contacting this office immediately if not controlling the symptoms using the rule of two's.     I had an extended discussion with the patient reviewing all relevant studies completed to date and  lasting 15 to 20 minutes of a 25 minute visit    See device teaching which extended face to face time for this visit.  Each maintenance medication was reviewed in detail including emphasizing most importantly the difference between maintenance and prns and under what circumstances the prns are to be triggered using an action plan format that is not reflected in the computer generated alphabetically organized AVS which I have not found useful in most complex patients, especially with respiratory illnesses  Please see AVS for specific instructions unique to this visit that I personally wrote and verbalized to the the pt in detail and then reviewed with pt  by my nurse highlighting any  changes in therapy recommended at today's visit to their plan of care.

## 2018-05-12 NOTE — Assessment & Plan Note (Signed)
Trial of afrin/ dysmista 05/11/2018 with 10 days of augmentin then sinus ct if not better

## 2018-06-09 ENCOUNTER — Other Ambulatory Visit: Payer: Self-pay | Admitting: Medical

## 2018-06-13 ENCOUNTER — Other Ambulatory Visit: Payer: Self-pay | Admitting: Medical

## 2018-06-24 ENCOUNTER — Other Ambulatory Visit: Payer: Self-pay

## 2018-06-24 ENCOUNTER — Emergency Department (INDEPENDENT_AMBULATORY_CARE_PROVIDER_SITE_OTHER)
Admission: EM | Admit: 2018-06-24 | Discharge: 2018-06-24 | Disposition: A | Discharge status: Home / Self Care | Payer: Managed Care, Other (non HMO) | Source: Home / Self Care | Attending: Family Medicine | Admitting: Family Medicine

## 2018-06-24 DIAGNOSIS — J4531 Mild persistent asthma with (acute) exacerbation: Secondary | ICD-10-CM

## 2018-06-24 MED ORDER — PREDNISONE 20 MG PO TABS: ORAL_TABLET | ORAL | 0 refills | Status: DC

## 2018-06-24 MED ORDER — ALBUTEROL SULFATE 108 (90 BASE) MCG/ACT IN AEPB: 2.0000 | INHALATION_SPRAY | Freq: Four times a day (QID) | RESPIRATORY_TRACT | 1 refills | Status: DC | PRN

## 2018-06-24 MED ORDER — IPRATROPIUM-ALBUTEROL 0.5-2.5 (3) MG/3ML IN SOLN
3.0000 mL | Freq: Four times a day (QID) | RESPIRATORY_TRACT | Status: DC
  Administered 2018-06-24: 3 mL via RESPIRATORY_TRACT

## 2018-06-24 MED ORDER — IPRATROPIUM-ALBUTEROL 0.5-2.5 (3) MG/3ML IN SOLN
3.0000 mL | Freq: Once | RESPIRATORY_TRACT | Status: AC
Start: 2018-06-24 — End: 2018-06-24
  Administered 2018-06-24: 3 mL via RESPIRATORY_TRACT

## 2018-06-24 NOTE — Discharge Instructions (Signed)
Continue all other medications as prescribed. If symptoms become significantly worse during the night or over the weekend, proceed to the local emergency room.

## 2018-06-24 NOTE — ED Provider Notes (Signed)
[facility]    CSN: [id number] Arrival date & time: 06/24/18  1152     History   Chief Complaint Chief Complaint  Patient presents with  . Asthma    HPI [name] is a 37 y.o. female.   Patient reports that she has asthma and needs a refill of her Ventolin inhaler.  She has occasional wheezing and shortness of breath with activity but does not feel that she is developing a respiratory infection.  No sore throat.  No fevers, chills, and sweats.  No pleuritic pain.  The history is provided by the patient.    Past Medical History:  Diagnosis Date  . Asthma   . History of chicken pox   . Pneumonia   . UTI (urinary tract infection)     Patient Active Problem List   Diagnosis Date Noted  . Rhinitis, chronic 05/11/2018  . Diarrhea 11/19/2016  . Rectal bleeding 11/19/2016  . Abdominal pain 11/19/2016  . Asthma exacerbation 10/10/2016  . Respiratory failure, acute (HCC) 10/10/2016  . ADD (attention deficit disorder) 10/10/2016  . Depression with anxiety 08/13/2014  . Morbid obesity due to excess calories (HCC) 08/13/2014  . Leukocytosis 11/27/2012  . Dehydration 11/27/2012  . Sinus tachycardia 11/27/2012  . Hypokalemia 11/26/2012  . Extrinsic asthma 08/17/2011    Past Surgical History:  Procedure Laterality Date  . CESAREAN SECTION  09/27/12    OB History   None      Home Medications    Prior to Admission medications   Medication Sig Start Date End Date Taking? Authorizing Provider  Albuterol Sulfate (PROAIR RESPICLICK) 108 (90 Base) MCG/ACT AEPB Inhale 2 puffs into the lungs every 6 (six) hours as needed. 06/24/18   Lattie Haw, MD  Azelastine-Fluticasone (DYMISTA) 137-50 MCG/ACT SUSP Place 1 puff into the nose 2 (two) times daily. 05/11/18   Nyoka Cowden, MD  budesonide-formoterol (SYMBICORT) 160-4.5 MCG/ACT inhaler Inhale 2 puffs into the lungs 2 (two) times daily. 08/09/17   Nyoka Cowden, MD  famotidine (PEPCID) 20 MG tablet TAKE 1  TABLET (20 MG TOTAL) BY MOUTH AT BEDTIME. 02/08/18   Saguier, Ramon Dredge, PA-C  fluticasone [facility]) 50 MCG/ACT nasal spray PLACE 2 SPRAYS INTO BOTH NOSTRILS ONCE DAILY 06/15/18   Saguier, Ramon Dredge, PA-C  montelukast (SINGULAIR) 10 MG tablet Take 1 tablet (10 mg total) by mouth at bedtime. 05/11/18   Nyoka Cowden, MD  Multiple Vitamins-Minerals (MULTIVITAMIN ADULT PO) Take 1 tablet by mouth every morning.    [provider]  omeprazole (PRILOSEC) 40 MG capsule Take 1 capsule (40 mg total) by mouth daily. 11/15/17   Saguier, Ramon Dredge, PA-C  predniSONE (DELTASONE) 20 MG tablet Take one tab by mouth twice daily for 4 days, then one daily for 3 days. Take with food. 06/24/18   Lattie Haw, MD    Family History Family History  Problem Relation Age of Onset  . Allergies Sister   . Cancer Sister        fallopian tube  . Fibroids Mother        Malignant  . Diabetes Father   . Colon cancer Maternal Grandfather   . Diabetes Paternal Grandmother   . Liver cancer Paternal Aunt   . Colon polyps Neg Hx   . Esophageal cancer Neg Hx   . Stomach cancer Neg Hx   . Rectal cancer Neg Hx     Social History Social History   Tobacco Use  . Smoking status: Never Smoker  .  Smokeless tobacco: Never Used  Substance Use Topics  . Alcohol use: No  . Drug use: No     Allergies   Aspirin; Levaquin [levofloxacin in d5w]; and Pamprin [apap-pamabrom-pyrilamine]   Review of Systems Review of Systems No sore throat + cough No pleuritic pain + wheezing No nasal congestion No post-nasal drainage No sinus pain/pressure No itchy/red eyes No earache No hemoptysis No SOB No fever/chills No nausea No vomiting No abdominal pain No diarrhea No urinary symptoms No skin rash No fatigue No myalgias No headache    Physical Exam Triage Vital Signs ED Triage Vitals [06/24/18 1239]  Enc Vitals Group     BP 116/73     Pulse Rate (!) 112     Resp 16     Temp 98.7 F (37.1 C)     Temp  Source Oral     SpO2 93 %     Weight 270 lb (122.5 kg)     Height 5\' 7"  (1.702 m)     Head Circumference      Peak Flow      Pain Score 0     Pain Loc      Pain Edu?      Excl. in GC?    No data found.  Updated Vital Signs BP 116/73 (BP Location: Right Arm)   Pulse (!) 112   Temp 98.7 F (37.1 C) (Oral)   Resp 16   Ht 5\' 7"  (1.702 m)   Wt 270 lb (122.5 kg)   SpO2 93%   BMI 42.29 kg/m   Visual Acuity Right Eye Distance:   Left Eye Distance:   Bilateral Distance:    Right Eye Near:   Left Eye Near:    Bilateral Near:     Physical Exam Nursing notes and Vital Signs reviewed. Appearance:  Patient appears stated age, and in no acute distress Eyes:  Pupils are equal, round, and reactive to light and accomodation.  Extraocular movement is intact.  Conjunctivae are not inflamed  Ears:  Canals normal.  Tympanic membranes normal.  Nose:  Mildly congested turbinates.  No sinus tenderness.  Pharynx:  Normal Neck:  Supple.  No adenopathy Lungs:   Few scattered expiratory wheezes.  Breath sounds are equal.  Moving air well. Heart:  Regular rate and rhythm without murmurs, rubs, or gallops.  Abdomen:  Nontender without masses or hepatosplenomegaly.  Bowel sounds are present.  No CVA or flank tenderness.  Extremities:  No edema.  Skin:  No rash present.    UC Treatments / Results  Labs (all labs ordered are listed, but only abnormal results are displayed) Labs Reviewed - No data to display  EKG None  Radiology No results found.  Procedures Procedures (including critical care time)  Medications Ordered in UC Medications  ipratropium-albuterol (DUONEB) 0.5-2.5 (3) MG/3ML nebulizer solution 3 mL (3 mLs Nebulization Given 06/24/18 1409)    Initial Impression / Assessment and Plan / UC Course  I have reviewed the triage vital signs and the nursing notes.  Pertinent labs & imaging results that were available during my care of the patient were reviewed by me and  considered in my medical decision making (see chart for details).    Administered DuoNeb by hand held nebulizer. Begin prednisone burst/taper. Refill albuterol MDI. Followup with PCP if not improved about 5 days.   Final Clinical Impressions(s) / UC Diagnoses   Final diagnoses:  Mild persistent asthma with exacerbation     Discharge Instructions  Continue all other medications as prescribed. If symptoms become significantly worse during the night or over the weekend, proceed to the local emergency room.     ED Prescriptions    Medication Sig Dispense Auth. Provider   Albuterol Sulfate (PROAIR RESPICLICK) 108 (90 Base) MCG/ACT AEPB Inhale 2 puffs into the lungs every 6 (six) hours as needed. 1 each Lattie HawBeese, Kameisha Malicki A, MD   predniSONE (DELTASONE) 20 MG tablet Take one tab by mouth twice daily for 4 days, then one daily for 3 days. Take with food. 12 tablet Lattie HawBeese, Lillyn Wieczorek A, MD        Lattie HawBeese, Patrice Moates A, MD 07/02/18 [phone]

## 2018-06-24 NOTE — ED Triage Notes (Signed)
Patient has chronic asthma and lost her inhaler 2 days ago; would like rx for Ventolin.

## 2018-08-17 ENCOUNTER — Other Ambulatory Visit: Payer: Self-pay | Admitting: Medical

## 2018-08-17 ENCOUNTER — Telehealth: Payer: Self-pay | Admitting: Medical

## 2018-08-17 ENCOUNTER — Encounter: Payer: Self-pay | Admitting: Medical

## 2018-08-17 ENCOUNTER — Ambulatory Visit: Payer: Self-pay | Admitting: Internal Medicine

## 2018-08-17 MED ORDER — ALBUTEROL SULFATE 108 (90 BASE) MCG/ACT IN AEPB: 2.0000 | INHALATION_SPRAY | Freq: Four times a day (QID) | RESPIRATORY_TRACT | 11 refills | Status: DC | PRN

## 2018-08-17 NOTE — Telephone Encounter (Signed)
Refilled pt proair resplick.

## 2018-11-07 ENCOUNTER — Ambulatory Visit: Payer: Self-pay

## 2018-11-07 ENCOUNTER — Encounter: Payer: Self-pay | Admitting: Family Medicine

## 2018-11-07 ENCOUNTER — Ambulatory Visit (INDEPENDENT_AMBULATORY_CARE_PROVIDER_SITE_OTHER): Payer: Managed Care, Other (non HMO) | Admitting: Family Medicine

## 2018-11-07 VITALS — BP 110/80 | HR 112 | Temp 97.8°F | Wt 285.0 lb

## 2018-11-07 DIAGNOSIS — J45901 Unspecified asthma with (acute) exacerbation: Secondary | ICD-10-CM

## 2018-11-07 MED ORDER — AZITHROMYCIN 250 MG PO TABS: ORAL_TABLET | ORAL | 0 refills | Status: DC

## 2018-11-07 MED ORDER — METHYLPREDNISOLONE SODIUM SUCC 125 MG IJ SOLR
125.0000 mg | Freq: Once | INTRAMUSCULAR | Status: AC
  Administered 2018-11-07: 125 mg via INTRAMUSCULAR

## 2018-11-07 MED ORDER — PREDNISONE 20 MG PO TABS: 40.0000 mg | ORAL_TABLET | Freq: Every day | ORAL | 0 refills | Status: AC

## 2018-11-07 MED ORDER — IPRATROPIUM-ALBUTEROL 0.5-2.5 (3) MG/3ML IN SOLN
3.0000 mL | Freq: Once | RESPIRATORY_TRACT | Status: AC
  Administered 2018-11-07: 3 mL via RESPIRATORY_TRACT

## 2018-11-07 MED ORDER — BENZONATATE 100 MG PO CAPS: 100.0000 mg | ORAL_CAPSULE | Freq: Three times a day (TID) | ORAL | 0 refills | Status: DC | PRN

## 2018-11-07 NOTE — Patient Instructions (Signed)
Asthma, Acute Bronchospasm °Acute bronchospasm caused by asthma is also referred to as an asthma attack. Bronchospasm means your air passages become narrowed. The narrowing is caused by inflammation and tightening of the muscles in the air tubes (bronchi) in your lungs. This can make it hard to breathe or cause you to wheeze and cough. °What are the causes? °Possible triggers are: °· Animal dander from the skin, hair, or feathers of animals. °· Dust mites contained in house dust. °· Cockroaches. °· Pollen from trees or grass. °· Mold. °· Cigarette or tobacco smoke. °· Air pollutants such as dust, household cleaners, hair sprays, aerosol sprays, paint fumes, strong chemicals, or strong odors. °· Cold air or weather changes. Cold air may trigger inflammation. Winds increase molds and pollens in the air. °· Strong emotions such as crying or laughing hard. °· Stress. °· Certain medicines such as aspirin or beta-blockers. °· Sulfites in foods and drinks, such as dried fruits and wine. °· Infections or inflammatory conditions, such as a flu, cold, or inflammation of the nasal membranes (rhinitis). °· Gastroesophageal reflux disease (GERD). GERD is a condition where stomach acid backs up into your esophagus. °· Exercise or strenuous activity. ° °What are the signs or symptoms? °· Wheezing. °· Excessive coughing, particularly at night. °· Chest tightness. °· Shortness of breath. °How is this diagnosed? °Your health care provider will ask you about your medical history and perform a physical exam. A chest X-ray or blood testing may be performed to look for other causes of your symptoms or other conditions that may have triggered your asthma attack. °How is this treated? °Treatment is aimed at reducing inflammation and opening up the airways in your lungs. Most asthma attacks are treated with inhaled medicines. These include quick relief or rescue medicines (such as bronchodilators) and controller medicines (such as inhaled  corticosteroids). These medicines are sometimes given through an inhaler or a nebulizer. Systemic steroid medicine taken by mouth or given through an IV tube also can be used to reduce the inflammation when an attack is moderate or severe. Antibiotic medicines are only used if a bacterial infection is present. °Follow these instructions at home: °· Rest. °· Drink plenty of liquids. This helps the mucus to remain thin and be easily coughed up. Only use caffeine in moderation and do not use alcohol until you have recovered from your illness. °· Do not smoke. Avoid being exposed to secondhand smoke. °· You play a critical role in keeping yourself in good health. Avoid exposure to things that cause you to wheeze or to have breathing problems. °· Keep your medicines up-to-date and available. Carefully follow your health care provider’s treatment plan. °· Take your medicine exactly as prescribed. °· When pollen or pollution is bad, keep windows closed and use an air conditioner or go to places with air conditioning. °· Asthma requires careful medical care. See your health care provider for a follow-up as advised. If you are more than [redacted] weeks pregnant and you were prescribed any new medicines, let your obstetrician know about the visit and how you are doing. Follow up with your health care provider as directed. °· After you have recovered from your asthma attack, make an appointment with your outpatient doctor to talk about ways to reduce the likelihood of future attacks. If you do not have a doctor who manages your asthma, make an appointment with a primary care doctor to discuss your asthma. °Get help right away if: °· You are getting worse. °·   You have trouble breathing. If severe, call your local emergency services (911 in the [place] °· You develop chest pain or discomfort. °· You are vomiting. °· You are not able to keep fluids down. °· You are coughing up yellow, green, brown, or bloody sputum. °· You have a fever  and your symptoms suddenly get worse. °· You have trouble swallowing. °This information is not intended to replace advice given to you by your health care provider. Make sure you discuss any questions you have with your health care provider. °Document Released: 03/23/2007 Document Revised: 05/19/2016 Document Reviewed: 06/13/2013 °Elsevier Interactive Patient Education © 2017 [employer] ° °

## 2018-11-07 NOTE — Telephone Encounter (Signed)
Pt. Reports she has been coughing for 2 weeks. Has developed sinus congestion, productive cough with dark green mucus. No fever today. Has used her nebulizer with "a little relief." "I don't want to get into trouble and have my asthma flare up." [facility]  Reason for Disposition . SEVERE coughing spells (e.g., whooping sound after coughing, vomiting after coughing)  Answer Assessment - Initial Assessment Questions 1. ONSET: "When did the cough begin?"      Started 2 weeks ago 2. SEVERITY: "How bad is the cough today?"      Moderate 3. RESPIRATORY DISTRESS: "Describe your breathing."      No distress 4. FEVER: "Do you have a fever?" If so, ask: "What is your temperature, how was it measured, and when did it start?"     No 5. SPUTUM: "Describe the color of your sputum" (clear, white, yellow, green)     Dark green 6. HEMOPTYSIS: "Are you coughing up any blood?" If so ask: "How much?" (flecks, streaks, tablespoons, etc.)     No 7. CARDIAC HISTORY: "Do you have any history of heart disease?" (e.g., heart attack, congestive heart failure)      No 8. LUNG HISTORY: "Do you have any history of lung disease?"  (e.g., pulmonary embolus, asthma, emphysema)     Asthma 9. PE RISK FACTORS: "Do you have a history of blood clots?" (or: recent major surgery, recent prolonged travel, bedridden)     No 10. OTHER SYMPTOMS: "Do you have any other symptoms?" (e.g., runny nose, wheezing, chest pain)       Runny nose and wheezing 11. PREGNANCY: "Is there any chance you are pregnant?" "When was your last menstrual period?"       No 12. TRAVEL: "Have you traveled out of the country in the last month?" (e.g., travel history, exposures)       No  Protocols used: COUGH - ACUTE PRODUCTIVE-A-AH

## 2018-11-07 NOTE — Progress Notes (Signed)
[name] - 37 y.o. female MRN [id number]  Date of birth: 1981/01/10  Subjective Chief Complaint  Patient presents with  . Wheezing  . Nasal Congestion    started friday   . Cough  . Shortness of Breath  . Fever    HPI [name] is a 37 y.o. female with history of asthma here today with complaint of cough, congestion, wheezing and shortness of breath.  She also had fever two days ago but none since.  Cough has been productive for green/yellow sputum with some occasional blood streaked in.  She has not tried anything for treatment other than her inhaler and nebulizer at home.  She has had moderate improvement with this.  She denies nausea, vomiting, diarrhea, sinus pain, headache, chest pain.  She is a non-smoker.  She has been drinking a good amount of fluids.   ROS:  A comprehensive ROS was completed and negative except as noted per HPI  Allergies  Allergen Reactions  . Aspirin Hives  . Levaquin [Levofloxacin In D5w] Hives  . Pamprin [Apap-Pamabrom-Pyrilamine] Hives    Past Medical History:  Diagnosis Date  . Asthma   . History of chicken pox   . Pneumonia   . UTI (urinary tract infection)     Past Surgical History:  Procedure Laterality Date  . CESAREAN SECTION  09/27/12    Social History   Socioeconomic History  . Marital status: Divorced    Spouse name: Not on file  . Number of children: 1  . Years of education: Not on file  . Highest education level: Not on file  Occupational History  . Occupation: Stay at home Mother    Employer: [employer]  Social Needs  . Financial resource strain: Not on file  . Food insecurity:    Worry: Not on file    Inability: Not on file  . Transportation needs:    Medical: Not on file    Non-medical: Not on file  Tobacco Use  . Smoking status: Never Smoker  . Smokeless tobacco: Never Used  Substance and Sexual Activity  . Alcohol use: No  . Drug use: No  . Sexual activity: Yes    Birth  control/protection: IUD  Lifestyle  . Physical activity:    Days per week: Not on file    Minutes per session: Not on file  . Stress: Not on file  Relationships  . Social connections:    Talks on phone: Not on file    Gets together: Not on file    Attends religious service: Not on file    Active member of club or organization: Not on file    Attends meetings of clubs or organizations: Not on file    Relationship status: Not on file  Other Topics Concern  . Not on file  Social History Narrative  . Not on file    Family History  Problem Relation Age of Onset  . Allergies Sister   . Cancer Sister        fallopian tube  . Fibroids Mother        Malignant  . Diabetes Father   . Colon cancer Maternal Grandfather   . Diabetes Paternal Grandmother   . Liver cancer Paternal Aunt   . Colon polyps Neg Hx   . Esophageal cancer Neg Hx   . Stomach cancer Neg Hx   . Rectal cancer Neg Hx     Health Maintenance  Topic Date Due  .  PAP SMEAR  10/20/2016  . INFLUENZA VACCINE  07/20/2018  . TETANUS/TDAP  12/20/2021  . HIV Screening  Completed    ----------------------------------------------------------------------------------------------------------------------------------------------------------------------------------------------------------------- Physical Exam BP 110/80   Pulse (!) 112   Temp 97.8 F (36.6 C) (Oral)   Wt 285 lb (129.3 kg)   SpO2 95%   BMI 44.64 kg/m   Physical Exam  Constitutional: She is oriented to person, place, and time. She appears well-nourished. No distress.  HENT:  Head: Normocephalic and atraumatic.  Mouth/Throat: Oropharynx is clear and moist.  Eyes: No scleral icterus.  Neck: Neck supple. No thyromegaly present.  Cardiovascular: Normal rate, regular rhythm and normal heart sounds.  Pulmonary/Chest: Effort normal.  Diffuse bilateral wheezing with decreased air movement.   Recheck after neb treatment:  Still with mild wheezing, improved air  movement.   Lymphadenopathy:    She has no cervical adenopathy.  Neurological: She is alert and oriented to person, place, and time. Coordination normal.  Skin: Skin is warm and dry.  Psychiatric: She has a normal mood and affect. Her behavior is normal.    ------------------------------------------------------------------------------------------------------------------------------------------------------------------------------------------------------------------- Assessment and Plan  Asthma exacerbation -Given duoneb tx in office with improvement of wheezing and air movement.  -Injection of 125mg  of Solu-Medrol given, will continue 40mg  prednisone burst x5 days as well.  -Rx for azithromycin -Continue home inhalers/nebulizer. -Follow up if not improving as expected or worsening over the next couple of days.

## 2018-11-07 NOTE — Assessment & Plan Note (Signed)
-  Given duoneb tx in office with improvement of wheezing and air movement.  -Injection of 125mg  of Solu-Medrol given, will continue 40mg  prednisone burst x5 days as well.  -Rx for azithromycin -Continue home inhalers/nebulizer. -Follow up if not improving as expected or worsening over the next couple of days.

## 2018-12-09 ENCOUNTER — Encounter (HOSPITAL_BASED_OUTPATIENT_CLINIC_OR_DEPARTMENT_OTHER): Payer: Self-pay | Admitting: *Deleted

## 2018-12-09 ENCOUNTER — Other Ambulatory Visit: Payer: Self-pay

## 2018-12-09 ENCOUNTER — Emergency Department (HOSPITAL_BASED_OUTPATIENT_CLINIC_OR_DEPARTMENT_OTHER)
Admission: EM | Admit: 2018-12-09 | Discharge: 2018-12-09 | Disposition: A | Discharge status: Home / Self Care | Payer: Managed Care, Other (non HMO) | Attending: Emergency Medicine | Admitting: Emergency Medicine

## 2018-12-09 ENCOUNTER — Emergency Department (HOSPITAL_BASED_OUTPATIENT_CLINIC_OR_DEPARTMENT_OTHER): Payer: Managed Care, Other (non HMO)

## 2018-12-09 DIAGNOSIS — J209 Acute bronchitis, unspecified: Secondary | ICD-10-CM | POA: Diagnosis not present

## 2018-12-09 DIAGNOSIS — R0602 Shortness of breath: Secondary | ICD-10-CM | POA: Diagnosis present

## 2018-12-09 DIAGNOSIS — Z79899 Other long term (current) drug therapy: Secondary | ICD-10-CM | POA: Diagnosis not present

## 2018-12-09 DIAGNOSIS — J40 Bronchitis, not specified as acute or chronic: Secondary | ICD-10-CM

## 2018-12-09 DIAGNOSIS — J4521 Mild intermittent asthma with (acute) exacerbation: Secondary | ICD-10-CM | POA: Insufficient documentation

## 2018-12-09 MED ORDER — ALBUTEROL SULFATE HFA 108 (90 BASE) MCG/ACT IN AERS
INHALATION_SPRAY | RESPIRATORY_TRACT | Status: AC
  Filled 2018-12-09: qty 6.7

## 2018-12-09 MED ORDER — ALBUTEROL SULFATE (2.5 MG/3ML) 0.083% IN NEBU
5.0000 mg | INHALATION_SOLUTION | Freq: Once | RESPIRATORY_TRACT | Status: AC
  Administered 2018-12-09: 5 mg via RESPIRATORY_TRACT
  Filled 2018-12-09: qty 6

## 2018-12-09 MED ORDER — DOXYCYCLINE HYCLATE 100 MG PO CAPS: 100.0000 mg | ORAL_CAPSULE | Freq: Two times a day (BID) | ORAL | 0 refills | Status: DC

## 2018-12-09 MED ORDER — IPRATROPIUM-ALBUTEROL 0.5-2.5 (3) MG/3ML IN SOLN
RESPIRATORY_TRACT | Status: AC
  Administered 2018-12-09: 3 mL
  Filled 2018-12-09: qty 3

## 2018-12-09 MED ORDER — DEXAMETHASONE SODIUM PHOSPHATE 10 MG/ML IJ SOLN
10.0000 mg | Freq: Once | INTRAMUSCULAR | Status: AC
  Administered 2018-12-09: 10 mg via INTRAMUSCULAR
  Filled 2018-12-09: qty 1

## 2018-12-09 MED ORDER — PREDNISONE 10 MG (21) PO TBPK: ORAL_TABLET | ORAL | 0 refills | Status: DC

## 2018-12-09 MED ORDER — LEVALBUTEROL HCL 1.25 MG/3ML IN NEBU: 1.2500 mg | INHALATION_SOLUTION | RESPIRATORY_TRACT | 0 refills | Status: DC | PRN

## 2018-12-09 MED ORDER — ALBUTEROL SULFATE HFA 108 (90 BASE) MCG/ACT IN AERS
1.0000 | INHALATION_SPRAY | RESPIRATORY_TRACT | Status: DC | PRN
  Administered 2018-12-09: 2 via RESPIRATORY_TRACT

## 2018-12-09 MED ORDER — ALBUTEROL SULFATE (2.5 MG/3ML) 0.083% IN NEBU
INHALATION_SOLUTION | RESPIRATORY_TRACT | Status: AC
  Administered 2018-12-09: 2.5 mg
  Filled 2018-12-09: qty 3

## 2018-12-09 MED ORDER — DOXYCYCLINE HYCLATE 100 MG PO TABS
100.0000 mg | ORAL_TABLET | Freq: Once | ORAL | Status: AC
  Administered 2018-12-09: 100 mg via ORAL
  Filled 2018-12-09: qty 1

## 2018-12-09 NOTE — ED Notes (Signed)
RT in room giving neb tx. Pt able to speak full sentences.

## 2018-12-09 NOTE — ED Notes (Signed)
Pt laughing with friend in room during neb tx. No acute distress. Pt encouraged to inhale tx.

## 2018-12-09 NOTE — ED Triage Notes (Signed)
Pt has hx of asthma. Reports increased SOB since 4pm, rescue inhaler not helping. BBS exp wheezes

## 2018-12-09 NOTE — ED Provider Notes (Signed)
[facility] EMERGENCY DEPARTMENT Provider Note   CSN: [id number] Arrival date & time: 12/09/18  1955     History   Chief Complaint Chief Complaint  Patient presents with  . Asthma    HPI [name] is a 37 y.o. female.  Pt presents to the ED today with SOB.  She has a hx of asthma and her normal inhalers have not been helping.  Pt said sx suddenly started around 1600.  She has had uri sx over the past few days.  Pt denies f/c.     Past Medical History:  Diagnosis Date  . Asthma   . History of chicken pox   . Pneumonia   . UTI (urinary tract infection)     Patient Active Problem List   Diagnosis Date Noted  . Rhinitis, chronic 05/11/2018  . Diarrhea 11/19/2016  . Rectal bleeding 11/19/2016  . Abdominal pain 11/19/2016  . Asthma exacerbation 10/10/2016  . Respiratory failure, acute (HCC) 10/10/2016  . ADD (attention deficit disorder) 10/10/2016  . Depression with anxiety 08/13/2014  . Morbid obesity due to excess calories (HCC) 08/13/2014  . Leukocytosis 11/27/2012  . Dehydration 11/27/2012  . Sinus tachycardia 11/27/2012  . Hypokalemia 11/26/2012  . Extrinsic asthma 08/17/2011    Past Surgical History:  Procedure Laterality Date  . CESAREAN SECTION  09/27/12     OB History   No obstetric history on file.      Home Medications    Prior to Admission medications   Medication Sig Start Date End Date Taking? Authorizing Provider  levalbuterol (XOPENEX) 1.25 MG/3ML nebulizer solution Take 1.25 mg by nebulization every 4 (four) hours as needed for wheezing.   Yes [provider]  Albuterol Sulfate (PROAIR RESPICLICK) 108 (90 Base) MCG/ACT AEPB Inhale 2 puffs into the lungs every 6 (six) hours as needed. 08/17/18   Saguier, Ramon DredgeEdward, PA-C  azithromycin (ZITHROMAX) 250 MG tablet Take 2 tabs PO on day 1, followed by 1 tab daily PO for days 2-5. 11/07/18   Everrett CoombeMatthews, Cody, DO  benzonatate (TESSALON) 100 MG capsule Take 1-2 capsules (100-200  mg total) by mouth 3 (three) times daily as needed for cough. 11/07/18   Everrett CoombeMatthews, Cody, DO  budesonide-formoterol (SYMBICORT) 160-4.5 MCG/ACT inhaler Inhale 2 puffs into the lungs 2 (two) times daily. 08/09/17   Nyoka CowdenWert, Michael B, MD  doxycycline (VIBRAMYCIN) 100 MG capsule Take 1 capsule (100 mg total) by mouth 2 (two) times daily. 12/09/18   Jacalyn LefevreHaviland, Syniyah Bourne, MD  famotidine (PEPCID) 20 MG tablet TAKE 1 TABLET (20 MG TOTAL) BY MOUTH AT BEDTIME. 08/17/18   Saguier, Ramon DredgeEdward, PA-C  fluticasone [facility](FLONASE) 50 MCG/ACT nasal spray PLACE 2 SPRAYS INTO BOTH NOSTRILS ONCE DAILY 06/15/18   Saguier, Ramon DredgeEdward, PA-C  montelukast (SINGULAIR) 10 MG tablet Take 1 tablet (10 mg total) by mouth at bedtime. 05/11/18   Nyoka CowdenWert, Michael B, MD  Multiple Vitamins-Minerals (MULTIVITAMIN ADULT PO) Take 1 tablet by mouth every morning.    [provider]  omeprazole (PRILOSEC) 40 MG capsule Take 1 capsule (40 mg total) by mouth daily. 11/15/17   Saguier, Ramon DredgeEdward, PA-C  predniSONE (STERAPRED UNI-PAK 21 TAB) 10 MG (21) TBPK tablet Take 6 tabs for 2 days, then 5 for 2 days, then 4 for 2 days, then 3 for 2 days, 2 for 2 days, then 1 for 2 days 12/09/18   Jacalyn LefevreHaviland, Azari Hasler, MD    Family History Family History  Problem Relation Age of Onset  . Allergies Sister   .  Cancer Sister        fallopian tube  . Fibroids Mother        Malignant  . Diabetes Father   . Colon cancer Maternal Grandfather   . Diabetes Paternal Grandmother   . Liver cancer Paternal Aunt   . Colon polyps Neg Hx   . Esophageal cancer Neg Hx   . Stomach cancer Neg Hx   . Rectal cancer Neg Hx     Social History Social History   Tobacco Use  . Smoking status: Never Smoker  . Smokeless tobacco: Never Used  Substance Use Topics  . Alcohol use: No  . Drug use: No     Allergies   Aspirin; Levaquin [levofloxacin in d5w]; and Pamprin [apap-pamabrom-pyrilamine]   Review of Systems Review of Systems  Respiratory: Positive for cough, shortness of breath  and wheezing.   All other systems reviewed and are negative.    Physical Exam Updated Vital Signs BP (!) 107/92 (BP Location: Right Arm)   Pulse (!) 112   Temp 98.2 F (36.8 C) (Oral)   Resp (!) 22   Ht 5\' 6"  (1.676 m)   Wt 131.1 kg   SpO2 98%   BMI 46.65 kg/m   Physical Exam Vitals signs and nursing note reviewed.  Constitutional:      Appearance: Normal appearance. She is obese.  HENT:     Head: Normocephalic and atraumatic.     Right Ear: External ear normal.     Left Ear: External ear normal.     Nose: Nose normal.     Mouth/Throat:     Mouth: Mucous membranes are moist.     Pharynx: Oropharynx is clear.  Eyes:     Extraocular Movements: Extraocular movements intact.     Conjunctiva/sclera: Conjunctivae normal.     Pupils: Pupils are equal, round, and reactive to light.  Neck:     Musculoskeletal: Normal range of motion.  Cardiovascular:     Rate and Rhythm: Regular rhythm. Tachycardia present.     Pulses: Normal pulses.     Heart sounds: Normal heart sounds.  Pulmonary:     Effort: Tachypnea and respiratory distress present.     Breath sounds: Wheezing present.  Abdominal:     General: Abdomen is flat.  Musculoskeletal: Normal range of motion.  Skin:    General: Skin is warm and dry.     Capillary Refill: Capillary refill takes less than 2 seconds.  Neurological:     General: No focal deficit present.     Mental Status: She is alert and oriented to person, place, and time.  Psychiatric:        Mood and Affect: Mood normal.        Behavior: Behavior normal.        Thought Content: Thought content normal.        Judgment: Judgment normal.      ED Treatments / Results  Labs (all labs ordered are listed, but only abnormal results are displayed) Labs Reviewed - No data to display  EKG None  Radiology Dg Chest 2 View  Result Date: 12/09/2018 CLINICAL DATA:  Pt has hx of asthma. Reports increased SOB since 4pm EXAM: CHEST - 2 VIEW COMPARISON:   01/04/2017 FINDINGS: Both views are mildly degraded by patient body habitus. Mild convex right thoracic spine curvature. Midline trachea. Normal heart size and mediastinal contours. No pleural effusion or pneumothorax. Diffuse peribronchial thickening. Clear lungs. IMPRESSION: 1. No acute cardiopulmonary disease. 2. Mild  peribronchial thickening which may relate to chronic bronchitis or asthma. Electronically Signed   By: Jeronimo GreavesKyle  Talbot M.D.   On: 12/09/2018 21:13    Procedures Procedures (including critical care time)  Medications Ordered in ED Medications  doxycycline (VIBRA-TABS) tablet 100 mg (has no administration in time range)  albuterol (PROVENTIL) (2.5 MG/3ML) 0.083% nebulizer solution (2.5 mg  Given 12/09/18 2006)  ipratropium-albuterol (DUONEB) 0.5-2.5 (3) MG/3ML nebulizer solution (3 mLs  Given 12/09/18 2006)  albuterol (PROVENTIL) (2.5 MG/3ML) 0.083% nebulizer solution 5 mg (5 mg Nebulization Given 12/09/18 2017)  dexamethasone (DECADRON) injection 10 mg (10 mg Intramuscular Given 12/09/18 2022)  albuterol (PROVENTIL) (2.5 MG/3ML) 0.083% nebulizer solution 5 mg (5 mg Nebulization Given 12/09/18 2118)     Initial Impression / Assessment and Plan / ED Course  I have reviewed the triage vital signs and the nursing notes.  Pertinent labs & imaging results that were available during my care of the patient were reviewed by me and considered in my medical decision making (see chart for details).    Pt is feeling much better after albuterol nebs and decadron.  The pt does not have a spacer for her inhaler, so she is given one prior to d/c.  She has been coughing up a lot of yellow mucous, so she will be started on doxy.  She knows to return if worse and to f/u with pcp.  Final Clinical Impressions(s) / ED Diagnoses   Final diagnoses:  Mild intermittent asthma with exacerbation  Bronchitis    ED Discharge Orders         Ordered    doxycycline (VIBRAMYCIN) 100 MG capsule  2 times  daily     12/09/18 2132    predniSONE (STERAPRED UNI-PAK 21 TAB) 10 MG (21) TBPK tablet     12/09/18 2132           Jacalyn LefevreHaviland, Rosellen Lichtenberger, MD 12/09/18 2136

## 2018-12-12 ENCOUNTER — Other Ambulatory Visit: Payer: Self-pay

## 2018-12-12 ENCOUNTER — Emergency Department (HOSPITAL_BASED_OUTPATIENT_CLINIC_OR_DEPARTMENT_OTHER)
Admission: EM | Admit: 2018-12-12 | Discharge: 2018-12-12 | Disposition: A | Discharge status: Home / Self Care | Payer: Managed Care, Other (non HMO) | Attending: Emergency Medicine | Admitting: Emergency Medicine

## 2018-12-12 ENCOUNTER — Encounter (HOSPITAL_BASED_OUTPATIENT_CLINIC_OR_DEPARTMENT_OTHER): Payer: Self-pay | Admitting: Emergency Medicine

## 2018-12-12 ENCOUNTER — Emergency Department (HOSPITAL_BASED_OUTPATIENT_CLINIC_OR_DEPARTMENT_OTHER): Payer: Managed Care, Other (non HMO)

## 2018-12-12 DIAGNOSIS — S63502A Unspecified sprain of left wrist, initial encounter: Secondary | ICD-10-CM | POA: Diagnosis not present

## 2018-12-12 DIAGNOSIS — Y92008 Other place in unspecified non-institutional (private) residence as the place of occurrence of the external cause: Secondary | ICD-10-CM | POA: Diagnosis not present

## 2018-12-12 DIAGNOSIS — Z79899 Other long term (current) drug therapy: Secondary | ICD-10-CM | POA: Insufficient documentation

## 2018-12-12 DIAGNOSIS — J45909 Unspecified asthma, uncomplicated: Secondary | ICD-10-CM | POA: Diagnosis not present

## 2018-12-12 DIAGNOSIS — S52182A Other fracture of upper end of left radius, initial encounter for closed fracture: Secondary | ICD-10-CM | POA: Diagnosis not present

## 2018-12-12 DIAGNOSIS — Y9389 Activity, other specified: Secondary | ICD-10-CM | POA: Insufficient documentation

## 2018-12-12 DIAGNOSIS — S59902A Unspecified injury of left elbow, initial encounter: Secondary | ICD-10-CM | POA: Diagnosis present

## 2018-12-12 DIAGNOSIS — Y998 Other external cause status: Secondary | ICD-10-CM | POA: Insufficient documentation

## 2018-12-12 DIAGNOSIS — W19XXXA Unspecified fall, initial encounter: Secondary | ICD-10-CM

## 2018-12-12 DIAGNOSIS — W0110XA Fall on same level from slipping, tripping and stumbling with subsequent striking against unspecified object, initial encounter: Secondary | ICD-10-CM | POA: Diagnosis not present

## 2018-12-12 DIAGNOSIS — Y92009 Unspecified place in unspecified non-institutional (private) residence as the place of occurrence of the external cause: Secondary | ICD-10-CM

## 2018-12-12 MED ORDER — HYDROCODONE-ACETAMINOPHEN 5-325 MG PO TABS
1.0000 | ORAL_TABLET | Freq: Once | ORAL | Status: AC
  Administered 2018-12-12: 1 via ORAL
  Filled 2018-12-12: qty 1

## 2018-12-12 MED ORDER — HYDROCODONE-ACETAMINOPHEN 5-325 MG PO TABS: 1.0000 | ORAL_TABLET | Freq: Four times a day (QID) | ORAL | 0 refills | Status: DC | PRN

## 2018-12-12 NOTE — ED Triage Notes (Signed)
Pt states she fell out the front door of her house  Pt is c/o left elbow and wrist pain  Pt has swelling noted to both areas

## 2018-12-12 NOTE — ED Provider Notes (Signed)
[facility] Provider Note: Lowella DellJ. Lane Addysin Porco, MD, FACEP  CSN: [id number] MRN: [id number] ARRIVAL: 12/12/18 at 0007 ROOM: MH09/[facility]09   CHIEF COMPLAINT  Fall   HISTORY OF PRESENT ILLNESS  12/12/18 1:18 AM [name] is a 37 y.o. female who fell coming out her front door about 7:30 PM yesterday evening.  She is having severe pain in her left elbow and left wrist with associated swelling and decreased range of motion.  Pain is worse with palpation or attempted movement.  She is having some paresthesias in her left arm are nerve distribution.    Past Medical History:  Diagnosis Date  . Asthma   . History of chicken pox   . Pneumonia   . UTI (urinary tract infection)     Past Surgical History:  Procedure Laterality Date  . CESAREAN SECTION  09/27/12    Family History  Problem Relation Age of Onset  . Allergies Sister   . Cancer Sister        fallopian tube  . Fibroids Mother        Malignant  . Diabetes Father   . Colon cancer Maternal Grandfather   . Diabetes Paternal Grandmother   . Liver cancer Paternal Aunt   . Colon polyps Neg Hx   . Esophageal cancer Neg Hx   . Stomach cancer Neg Hx   . Rectal cancer Neg Hx     Social History   Tobacco Use  . Smoking status: Never Smoker  . Smokeless tobacco: Never Used  Substance Use Topics  . Alcohol use: No  . Drug use: No    Prior to Admission medications   Medication Sig Start Date End Date Taking? Authorizing Provider  Albuterol Sulfate (PROAIR RESPICLICK) 108 (90 Base) MCG/ACT AEPB Inhale 2 puffs into the lungs every 6 (six) hours as needed. 08/17/18   Saguier, Ramon DredgeEdward, PA-C  azithromycin (ZITHROMAX) 250 MG tablet Take 2 tabs PO on day 1, followed by 1 tab daily PO for days 2-5. 11/07/18   Everrett CoombeMatthews, Cody, DO  benzonatate (TESSALON) 100 MG capsule Take 1-2 capsules (100-200 mg total) by mouth 3 (three) times daily as needed for cough. 11/07/18   Everrett CoombeMatthews, Cody, DO  budesonide-formoterol (SYMBICORT)  160-4.5 MCG/ACT inhaler Inhale 2 puffs into the lungs 2 (two) times daily. 08/09/17   Nyoka CowdenWert, Michael B, MD  doxycycline (VIBRAMYCIN) 100 MG capsule Take 1 capsule (100 mg total) by mouth 2 (two) times daily. 12/09/18   Jacalyn LefevreHaviland, Julie, MD  famotidine (PEPCID) 20 MG tablet TAKE 1 TABLET (20 MG TOTAL) BY MOUTH AT BEDTIME. 08/17/18   Saguier, Ramon DredgeEdward, PA-C  fluticasone [facility](FLONASE) 50 MCG/ACT nasal spray PLACE 2 SPRAYS INTO BOTH NOSTRILS ONCE DAILY 06/15/18   Saguier, Ramon DredgeEdward, PA-C  levalbuterol (XOPENEX) 1.25 MG/3ML nebulizer solution Take 1.25 mg by nebulization every 4 (four) hours as needed for wheezing. 12/09/18   Jacalyn LefevreHaviland, Julie, MD  montelukast (SINGULAIR) 10 MG tablet Take 1 tablet (10 mg total) by mouth at bedtime. 05/11/18   Nyoka CowdenWert, Michael B, MD  Multiple Vitamins-Minerals (MULTIVITAMIN ADULT PO) Take 1 tablet by mouth every morning.    [provider]  omeprazole (PRILOSEC) 40 MG capsule Take 1 capsule (40 mg total) by mouth daily. 11/15/17   Saguier, Ramon DredgeEdward, PA-C  predniSONE (STERAPRED UNI-PAK 21 TAB) 10 MG (21) TBPK tablet Take 6 tabs for 2 days, then 5 for 2 days, then 4 for 2 days, then 3 for 2 days, 2 for 2 days, then 1 for 2 days  12/09/18   Jacalyn Lefevre, MD    Allergies Aspirin; Levaquin [levofloxacin in d5w]; and Pamprin [apap-pamabrom-pyrilamine]   REVIEW OF SYSTEMS  Negative except as noted here or in the History of Present Illness.   PHYSICAL EXAMINATION  Initial Vital Signs Blood pressure 135/79, pulse (!) 109, temperature 98.2 F (36.8 C), temperature source Oral, resp. rate 20, height 5\' 6"  (1.676 m), weight 131 kg, SpO2 94 %.  Examination General: Well-developed, well-nourished female in no acute distress; appearance consistent with age of record HENT: normocephalic; atraumatic Eyes: pupils equal, round and reactive to light; extraocular muscles intact Neck: supple; nontender Heart: regular rate and rhythm Lungs: clear to auscultation bilaterally Abdomen: soft;  nondistended; nontender; bowel sounds present Extremities: No deformity; tenderness, swelling and decreased range of motion of left elbow; tenderness of left wrist Neurologic: Awake, alert and oriented; motor function intact in all extremities and symmetric; altered sensation of left hand in ulnar nerve distribution; no facial droop Skin: Warm and dry Psychiatric: Normal mood and affect   RESULTS  Summary of this visit's results, reviewed by myself:   EKG Interpretation  Date/Time:    Ventricular Rate:    PR Interval:    QRS Duration:   QT Interval:    QTC Calculation:   R Axis:     Text Interpretation:        Laboratory Studies: No results found for this or any previous visit (from the past 24 hour(s)). Imaging Studies: Dg Elbow Complete Left  Result Date: 12/12/2018 CLINICAL DATA:  Status post fall, with injury to the left elbow. Left posterior elbow pain. Initial encounter. EXAM: LEFT ELBOW - COMPLETE 3+ VIEW COMPARISON:  None. FINDINGS: There is a significantly displaced and comminuted fracture involving the proximal radius, with marked rotation of the larger fragment. An associated elbow joint effusion is noted. The proximal ulna is grossly unremarkable. Soft tissue swelling is noted about the elbow. IMPRESSION: Significantly displaced and comminuted fracture involving the proximal radius, with marked rotation of the larger fragment. Associated elbow joint effusion noted. Electronically Signed   By: Roanna Raider M.D.   On: 12/12/2018 01:02   Dg Wrist Complete Left  Result Date: 12/12/2018 CLINICAL DATA:  Status post fall, with left posterior wrist pain. Initial encounter. EXAM: LEFT WRIST - COMPLETE 3+ VIEW COMPARISON:  None. FINDINGS: There is no evidence of fracture or dislocation. The carpal rows are intact, and demonstrate normal alignment. The joint spaces are preserved. No significant soft tissue abnormalities are seen. IMPRESSION: No evidence of fracture or  dislocation. Electronically Signed   By: Roanna Raider M.D.   On: 12/12/2018 01:02    ED COURSE and MDM  Nursing notes and initial vitals signs, including pulse oximetry, reviewed.  Vitals:   12/12/18 0014 12/12/18 0016  BP: 135/79   Pulse: (!) 109   Resp: 20   Temp: 98.2 F (36.8 C)   TempSrc: Oral   SpO2: 94%   Weight:  131 kg  Height:  5\' 6"  (1.676 m)   1:27 AM We will place left wrist and elbow in the splint and sling and referred to [facility] ([facility]).  Consultation with the [facility] state controlled substances database reveals the patient has received no opioid pain prescriptions in the past 2 years.   PROCEDURES    ED DIAGNOSES     ICD-10-CM   1. Fall in home, initial encounter W19.XXXA    Y92.009   2. Other closed fracture of proximal end of left radius, initial  encounter S52.182A   3. Sprain of left wrist, initial encounter M57.846NS63.502A        Paula LibraMolpus, Himani Corona, MD 12/12/18 0130

## 2018-12-12 NOTE — ED Notes (Signed)
Wrist splint removed to apply long arm splint

## 2018-12-15 ENCOUNTER — Other Ambulatory Visit: Payer: Self-pay

## 2018-12-15 ENCOUNTER — Encounter (HOSPITAL_COMMUNITY): Payer: Self-pay | Admitting: *Deleted

## 2018-12-15 NOTE — Progress Notes (Addendum)
Ms [name] denies chest painjor shortness of breath.  Patient was seen in the Emergency with bronchitis on Saturday, December 21.  Patient is now on an antibiotic and Prednisone, reducing dose package, day 5. Ms [name] reports that she feels much better, not having to use inhaler or nebulizer. [facility]. Pulmonologlist is Dr [name]. Patient saw Dr [name] in May was to follow up in 3 months but didn't, patient said that she spoke with the nurse. Patient  York SpanielSaid that Dr [name] has given her instructions on treating asthma attacks: If you use inhaler more that 4 times a day, go to Xopnex neb, if you use it more 4 or more times go to ED, this is what she did on Saturday, December 21.  I instructed patient to hold Ibuprofen and to not add Aspirin or Naproxen. Patient asked what should she take for pain, "I do not like to that the Hydrocodone- Acetaminophen during the day."  I informed patient that she could take Acetaminophen (Tylenol), I asked her to monitor the amount, to not take more than 4000 mg a day. Patient reports that she has extra strength tylenol, we went over that if she takes 2 Extra Strength Tylenol at a time that she could take it 3 times in a 24 hour period, and take 1 Tylenol and 1 Vicodin at bedtime.  Patient voiced understanding.

## 2018-12-16 ENCOUNTER — Ambulatory Visit (HOSPITAL_COMMUNITY): Payer: Managed Care, Other (non HMO) | Admitting: Physician Assistant

## 2018-12-16 ENCOUNTER — Encounter (HOSPITAL_COMMUNITY): Payer: Self-pay | Admitting: *Deleted

## 2018-12-16 ENCOUNTER — Observation Stay (HOSPITAL_COMMUNITY)
Admission: RE | Admit: 2018-12-16 | Discharge: 2018-12-17 | Disposition: A | Discharge status: Home / Self Care | Payer: Managed Care, Other (non HMO) | Attending: Orthopedic Surgery | Admitting: Orthopedic Surgery

## 2018-12-16 ENCOUNTER — Encounter (HOSPITAL_COMMUNITY)
Admission: RE | Disposition: A | Discharge status: Home / Self Care | Payer: Self-pay | Source: Home / Self Care | Attending: Orthopedic Surgery

## 2018-12-16 DIAGNOSIS — Z87891 Personal history of nicotine dependence: Secondary | ICD-10-CM | POA: Insufficient documentation

## 2018-12-16 DIAGNOSIS — Z886 Allergy status to analgesic agent status: Secondary | ICD-10-CM | POA: Insufficient documentation

## 2018-12-16 DIAGNOSIS — F418 Other specified anxiety disorders: Secondary | ICD-10-CM | POA: Diagnosis not present

## 2018-12-16 DIAGNOSIS — X58XXXA Exposure to other specified factors, initial encounter: Secondary | ICD-10-CM | POA: Insufficient documentation

## 2018-12-16 DIAGNOSIS — S42402A Unspecified fracture of lower end of left humerus, initial encounter for closed fracture: Secondary | ICD-10-CM | POA: Diagnosis present

## 2018-12-16 DIAGNOSIS — Z791 Long term (current) use of non-steroidal anti-inflammatories (NSAID): Secondary | ICD-10-CM | POA: Diagnosis not present

## 2018-12-16 DIAGNOSIS — J45909 Unspecified asthma, uncomplicated: Secondary | ICD-10-CM | POA: Diagnosis not present

## 2018-12-16 DIAGNOSIS — Z881 Allergy status to other antibiotic agents status: Secondary | ICD-10-CM | POA: Diagnosis not present

## 2018-12-16 DIAGNOSIS — Z79899 Other long term (current) drug therapy: Secondary | ICD-10-CM | POA: Insufficient documentation

## 2018-12-16 DIAGNOSIS — K219 Gastro-esophageal reflux disease without esophagitis: Secondary | ICD-10-CM | POA: Insufficient documentation

## 2018-12-16 DIAGNOSIS — S53432A Radial collateral ligament sprain of left elbow, initial encounter: Secondary | ICD-10-CM | POA: Insufficient documentation

## 2018-12-16 DIAGNOSIS — S52122A Displaced fracture of head of left radius, initial encounter for closed fracture: Secondary | ICD-10-CM | POA: Diagnosis present

## 2018-12-16 HISTORY — PX: ORIF ELBOW FRACTURE: SHX5031

## 2018-12-16 HISTORY — DX: Gastro-esophageal reflux disease without esophagitis: K21.9

## 2018-12-16 LAB — POCT PREGNANCY, URINE: Preg Test, Ur: NEGATIVE

## 2018-12-16 SURGERY — OPEN REDUCTION INTERNAL FIXATION (ORIF) ELBOW/OLECRANON FRACTURE
Anesthesia: General | Site: Elbow | Laterality: Left

## 2018-12-16 MED ORDER — MOMETASONE FURO-FORMOTEROL FUM 200-5 MCG/ACT IN AERO
2.0000 | INHALATION_SPRAY | Freq: Two times a day (BID) | RESPIRATORY_TRACT | Status: DC
  Administered 2018-12-16 – 2018-12-17 (×2): 2 via RESPIRATORY_TRACT
  Filled 2018-12-16: qty 8.8

## 2018-12-16 MED ORDER — DEXMEDETOMIDINE HCL 200 MCG/2ML IV SOLN
INTRAVENOUS | Status: DC | PRN
  Administered 2018-12-16: 12 ug via INTRAVENOUS
  Administered 2018-12-16: 4 ug via INTRAVENOUS
  Administered 2018-12-16: 8 ug via INTRAVENOUS

## 2018-12-16 MED ORDER — VITAMIN C 500 MG PO TABS
1000.0000 mg | ORAL_TABLET | Freq: Every day | ORAL | Status: DC
  Administered 2018-12-17: 1000 mg via ORAL
  Filled 2018-12-16: qty 2

## 2018-12-16 MED ORDER — FENTANYL CITRATE (PF) 100 MCG/2ML IJ SOLN
INTRAMUSCULAR | Status: DC | PRN
  Administered 2018-12-16: 25 ug via INTRAVENOUS
  Administered 2018-12-16: 150 ug via INTRAVENOUS
  Administered 2018-12-16: 25 ug via INTRAVENOUS

## 2018-12-16 MED ORDER — LACTATED RINGERS IV SOLN
INTRAVENOUS | Status: DC
  Administered 2018-12-16: 15:00:00 via INTRAVENOUS

## 2018-12-16 MED ORDER — PROPOFOL 10 MG/ML IV BOLUS
INTRAVENOUS | Status: DC | PRN
  Administered 2018-12-16: 150 mg via INTRAVENOUS

## 2018-12-16 MED ORDER — DEXAMETHASONE SODIUM PHOSPHATE 10 MG/ML IJ SOLN
INTRAMUSCULAR | Status: DC | PRN
  Administered 2018-12-16: 10 mg via INTRAVENOUS

## 2018-12-16 MED ORDER — DEXTROSE 5 % IV SOLN
3.0000 g | Freq: Once | INTRAVENOUS | Status: AC
  Administered 2018-12-16: 3 g via INTRAVENOUS
  Filled 2018-12-16: qty 3

## 2018-12-16 MED ORDER — ONDANSETRON HCL 4 MG/2ML IJ SOLN
INTRAMUSCULAR | Status: DC | PRN
  Administered 2018-12-16: 4 mg via INTRAVENOUS

## 2018-12-16 MED ORDER — HYDROMORPHONE HCL 1 MG/ML IJ SOLN
0.5000 mg | INTRAMUSCULAR | Status: DC | PRN
  Administered 2018-12-17: 1 mg via INTRAVENOUS
  Filled 2018-12-16: qty 1

## 2018-12-16 MED ORDER — ROCURONIUM BROMIDE 10 MG/ML (PF) SYRINGE
PREFILLED_SYRINGE | INTRAVENOUS | Status: DC | PRN
  Administered 2018-12-16: 50 mg via INTRAVENOUS
  Administered 2018-12-16: 10 mg via INTRAVENOUS
  Administered 2018-12-16: 20 mg via INTRAVENOUS
  Administered 2018-12-16: 10 mg via INTRAVENOUS

## 2018-12-16 MED ORDER — PROMETHAZINE HCL 25 MG/ML IJ SOLN: 6.2500 mg | INTRAMUSCULAR | Status: DC | PRN

## 2018-12-16 MED ORDER — ONDANSETRON HCL 4 MG/2ML IJ SOLN
INTRAMUSCULAR | Status: AC
  Filled 2018-12-16: qty 2

## 2018-12-16 MED ORDER — ALPRAZOLAM 0.5 MG PO TABS: 0.5000 mg | ORAL_TABLET | Freq: Four times a day (QID) | ORAL | Status: DC | PRN

## 2018-12-16 MED ORDER — DOCUSATE SODIUM 100 MG PO CAPS
100.0000 mg | ORAL_CAPSULE | Freq: Two times a day (BID) | ORAL | Status: DC
  Administered 2018-12-16 – 2018-12-17 (×2): 100 mg via ORAL
  Filled 2018-12-16 (×2): qty 1

## 2018-12-16 MED ORDER — OXYCODONE HCL 5 MG PO TABS: 5.0000 mg | ORAL_TABLET | Freq: Once | ORAL | Status: DC | PRN

## 2018-12-16 MED ORDER — ROPIVACAINE HCL 5 MG/ML IJ SOLN
INTRAMUSCULAR | Status: DC | PRN
  Administered 2018-12-16: 30 mL via PERINEURAL

## 2018-12-16 MED ORDER — ACETAMINOPHEN 10 MG/ML IV SOLN
INTRAVENOUS | Status: AC
  Filled 2018-12-16: qty 100

## 2018-12-16 MED ORDER — MIDAZOLAM HCL 5 MG/5ML IJ SOLN
INTRAMUSCULAR | Status: DC | PRN
  Administered 2018-12-16 (×2): 1 mg via INTRAVENOUS

## 2018-12-16 MED ORDER — OXYCODONE HCL 5 MG PO TABS
5.0000 mg | ORAL_TABLET | ORAL | Status: DC | PRN
  Administered 2018-12-16 – 2018-12-17 (×2): 5 mg via ORAL
  Administered 2018-12-17: 10 mg via ORAL
  Filled 2018-12-16 (×2): qty 1
  Filled 2018-12-16: qty 2

## 2018-12-16 MED ORDER — OXYCODONE HCL 5 MG/5ML PO SOLN: 5.0000 mg | Freq: Once | ORAL | Status: DC | PRN

## 2018-12-16 MED ORDER — LACTATED RINGERS IV SOLN: INTRAVENOUS | Status: DC

## 2018-12-16 MED ORDER — PROPOFOL 10 MG/ML IV BOLUS
INTRAVENOUS | Status: AC
  Filled 2018-12-16: qty 20

## 2018-12-16 MED ORDER — LIDOCAINE 2% (20 MG/ML) 5 ML SYRINGE
INTRAMUSCULAR | Status: AC
  Filled 2018-12-16: qty 5

## 2018-12-16 MED ORDER — LACTATED RINGERS IV SOLN
INTRAVENOUS | Status: DC
  Administered 2018-12-16: 08:00:00 via INTRAVENOUS

## 2018-12-16 MED ORDER — ALBUTEROL SULFATE (2.5 MG/3ML) 0.083% IN NEBU: 2.5000 mg | INHALATION_SOLUTION | Freq: Four times a day (QID) | RESPIRATORY_TRACT | Status: DC | PRN

## 2018-12-16 MED ORDER — ALBUTEROL SULFATE HFA 108 (90 BASE) MCG/ACT IN AERS
INHALATION_SPRAY | RESPIRATORY_TRACT | Status: AC
  Filled 2018-12-16: qty 6.7

## 2018-12-16 MED ORDER — ALBUTEROL SULFATE HFA 108 (90 BASE) MCG/ACT IN AERS
INHALATION_SPRAY | RESPIRATORY_TRACT | Status: DC | PRN
  Administered 2018-12-16: 6 via RESPIRATORY_TRACT

## 2018-12-16 MED ORDER — LACTATED RINGERS IV SOLN
INTRAVENOUS | Status: DC | PRN
  Administered 2018-12-16: 09:00:00 via INTRAVENOUS

## 2018-12-16 MED ORDER — LIDOCAINE 2% (20 MG/ML) 5 ML SYRINGE
INTRAMUSCULAR | Status: DC | PRN
  Administered 2018-12-16: 40 mg via INTRAVENOUS
  Administered 2018-12-16: 60 mg via INTRAVENOUS

## 2018-12-16 MED ORDER — ROCURONIUM BROMIDE 50 MG/5ML IV SOSY
PREFILLED_SYRINGE | INTRAVENOUS | Status: AC
  Filled 2018-12-16: qty 10

## 2018-12-16 MED ORDER — ONDANSETRON HCL 4 MG/2ML IJ SOLN: 4.0000 mg | Freq: Four times a day (QID) | INTRAMUSCULAR | Status: DC | PRN

## 2018-12-16 MED ORDER — FAMOTIDINE 20 MG PO TABS
20.0000 mg | ORAL_TABLET | Freq: Every day | ORAL | Status: DC
  Administered 2018-12-16: 20 mg via ORAL
  Filled 2018-12-16: qty 1

## 2018-12-16 MED ORDER — METHOCARBAMOL 1000 MG/10ML IJ SOLN
500.0000 mg | Freq: Four times a day (QID) | INTRAVENOUS | Status: DC | PRN
  Filled 2018-12-16: qty 5

## 2018-12-16 MED ORDER — BUPIVACAINE-EPINEPHRINE (PF) 0.5% -1:200000 IJ SOLN: INTRAMUSCULAR | Status: DC | PRN

## 2018-12-16 MED ORDER — METHOCARBAMOL 500 MG PO TABS
500.0000 mg | ORAL_TABLET | Freq: Four times a day (QID) | ORAL | Status: DC | PRN
  Administered 2018-12-17: 500 mg via ORAL
  Filled 2018-12-16: qty 1

## 2018-12-16 MED ORDER — PROMETHAZINE HCL 12.5 MG RE SUPP
12.5000 mg | Freq: Four times a day (QID) | RECTAL | Status: DC | PRN
  Filled 2018-12-16: qty 1

## 2018-12-16 MED ORDER — FENTANYL CITRATE (PF) 250 MCG/5ML IJ SOLN
INTRAMUSCULAR | Status: AC
  Filled 2018-12-16: qty 5

## 2018-12-16 MED ORDER — CEFAZOLIN SODIUM-DEXTROSE 1-4 GM/50ML-% IV SOLN
1.0000 g | Freq: Three times a day (TID) | INTRAVENOUS | Status: DC
  Administered 2018-12-16 – 2018-12-17 (×3): 1 g via INTRAVENOUS
  Filled 2018-12-16 (×3): qty 50

## 2018-12-16 MED ORDER — DEXMEDETOMIDINE HCL IN NACL 200 MCG/50ML IV SOLN
INTRAVENOUS | Status: AC
  Filled 2018-12-16: qty 50

## 2018-12-16 MED ORDER — HYDROMORPHONE HCL 1 MG/ML IJ SOLN: 0.2500 mg | INTRAMUSCULAR | Status: DC | PRN

## 2018-12-16 MED ORDER — CEFAZOLIN SODIUM-DEXTROSE 1-4 GM/50ML-% IV SOLN
1.0000 g | INTRAVENOUS | Status: AC
  Administered 2018-12-16: 1 g via INTRAVENOUS
  Filled 2018-12-16: qty 50

## 2018-12-16 MED ORDER — 0.9 % SODIUM CHLORIDE (POUR BTL) OPTIME
TOPICAL | Status: DC | PRN
  Administered 2018-12-16: 1000 mL

## 2018-12-16 MED ORDER — ONDANSETRON HCL 4 MG PO TABS: 4.0000 mg | ORAL_TABLET | Freq: Four times a day (QID) | ORAL | Status: DC | PRN

## 2018-12-16 MED ORDER — ONDANSETRON HCL 4 MG/2ML IJ SOLN
4.0000 mg | Freq: Four times a day (QID) | INTRAMUSCULAR | Status: DC | PRN
  Filled 2018-12-16: qty 2

## 2018-12-16 MED ORDER — ACETAMINOPHEN 10 MG/ML IV SOLN
INTRAVENOUS | Status: DC | PRN
  Administered 2018-12-16: 1000 mg via INTRAVENOUS

## 2018-12-16 MED ORDER — MIDAZOLAM HCL 2 MG/2ML IJ SOLN
INTRAMUSCULAR | Status: AC
  Filled 2018-12-16: qty 2

## 2018-12-16 MED ORDER — MEPERIDINE HCL 50 MG/ML IJ SOLN: 6.2500 mg | INTRAMUSCULAR | Status: DC | PRN

## 2018-12-16 MED ORDER — SUGAMMADEX SODIUM 200 MG/2ML IV SOLN
INTRAVENOUS | Status: DC | PRN
  Administered 2018-12-16: 300 mg via INTRAVENOUS

## 2018-12-16 MED ORDER — DEXAMETHASONE SODIUM PHOSPHATE 10 MG/ML IJ SOLN
INTRAMUSCULAR | Status: AC
  Filled 2018-12-16: qty 1

## 2018-12-16 MED ORDER — SUGAMMADEX SODIUM 500 MG/5ML IV SOLN
INTRAVENOUS | Status: AC
  Filled 2018-12-16: qty 5

## 2018-12-16 SURGICAL SUPPLY — 57 items
ANCHOR SUT 1.45 SZ 1 SHORT (Anchor) ×3 IMPLANT
BANDAGE ACE 3X5.8 VEL STRL LF (GAUZE/BANDAGES/DRESSINGS) ×3 IMPLANT
BANDAGE ACE 4X5 VEL STRL LF (GAUZE/BANDAGES/DRESSINGS) ×3 IMPLANT
BLADE LONG MED 31MMX9MM (MISCELLANEOUS) ×1
BLADE LONG MED 31X9 (MISCELLANEOUS) ×2 IMPLANT
BNDG COHESIVE 4X5 TAN STRL (GAUZE/BANDAGES/DRESSINGS) ×3 IMPLANT
BNDG ESMARK 4X9 LF (GAUZE/BANDAGES/DRESSINGS) ×3 IMPLANT
BNDG GAUZE ELAST 4 BULKY (GAUZE/BANDAGES/DRESSINGS) ×3 IMPLANT
CORDS BIPOLAR (ELECTRODE) ×3 IMPLANT
COVER MAYO STAND STRL (DRAPES) ×3 IMPLANT
COVER SURGICAL LIGHT HANDLE (MISCELLANEOUS) ×9 IMPLANT
COVER WAND RF STERILE (DRAPES) ×3 IMPLANT
CUFF TOURNIQUET SINGLE 18IN (TOURNIQUET CUFF) ×3 IMPLANT
CUFF TOURNIQUET SINGLE 24IN (TOURNIQUET CUFF) IMPLANT
DRAPE INCISE IOBAN 66X45 STRL (DRAPES) ×3 IMPLANT
DRAPE OEC MINIVIEW 54X84 (DRAPES) ×3 IMPLANT
DRSG ADAPTIC 3X8 NADH LF (GAUZE/BANDAGES/DRESSINGS) ×3 IMPLANT
ELECT CAUTERY BLADE 6.4 (BLADE) ×3 IMPLANT
GAUZE SPONGE 4X4 12PLY STRL (GAUZE/BANDAGES/DRESSINGS) IMPLANT
GAUZE SPONGE 4X4 12PLY STRL LF (GAUZE/BANDAGES/DRESSINGS) ×3 IMPLANT
GAUZE XEROFORM 1X8 LF (GAUZE/BANDAGES/DRESSINGS) ×3 IMPLANT
GLOVE BIOGEL M 8.0 STRL (GLOVE) IMPLANT
GLOVE SS BIOGEL STRL SZ 8 (GLOVE) ×2 IMPLANT
GLOVE SUPERSENSE BIOGEL SZ 8 (GLOVE) ×4
GOWN STRL REUS W/ TWL LRG LVL3 (GOWN DISPOSABLE) IMPLANT
GOWN STRL REUS W/ TWL XL LVL3 (GOWN DISPOSABLE) ×2 IMPLANT
GOWN STRL REUS W/TWL LRG LVL3 (GOWN DISPOSABLE)
GOWN STRL REUS W/TWL XL LVL3 (GOWN DISPOSABLE) ×4
HEAD RADIAL 10X20 (Head) ×3 IMPLANT
IMPL STEM W/SCREW 7X26MM (Stem) ×1 IMPLANT
IMPLANT STEM W/SCREW 7X26MM (Stem) ×3 IMPLANT
KIT BASIN OR (CUSTOM PROCEDURE TRAY) ×3 IMPLANT
KIT TURNOVER KIT B (KITS) ×3 IMPLANT
LOOP VESSEL MAXI BLUE (MISCELLANEOUS) IMPLANT
MANIFOLD NEPTUNE II (INSTRUMENTS) ×3 IMPLANT
NEEDLE HYPO 25GX1X1/2 BEV (NEEDLE) IMPLANT
NS IRRIG 1000ML POUR BTL (IV SOLUTION) ×3 IMPLANT
PACK ORTHO EXTREMITY (CUSTOM PROCEDURE TRAY) ×3 IMPLANT
PAD ARMBOARD 7.5X6 YLW CONV (MISCELLANEOUS) ×6 IMPLANT
PAD CAST 4YDX4 CTTN HI CHSV (CAST SUPPLIES) ×1 IMPLANT
PADDING CAST COTTON 4X4 STRL (CAST SUPPLIES) ×2
SCRUB BETADINE 4OZ XXX (MISCELLANEOUS) ×3 IMPLANT
SLING ARM FOAM STRAP XLG (SOFTGOODS) ×3 IMPLANT
SOL PREP POV-IOD 4OZ 10% (MISCELLANEOUS) ×3 IMPLANT
SPECIMEN JAR SMALL (MISCELLANEOUS) IMPLANT
SPLINT FIBERGLASS 4X30 (CAST SUPPLIES) ×3 IMPLANT
SUT FIBERWIRE 2-0 18 17.9 3/8 (SUTURE) ×3
SUT PROLENE 3 0 PS 2 (SUTURE) ×6 IMPLANT
SUT VIC AB 3-0 FS2 27 (SUTURE) IMPLANT
SUTURE FIBERWR 2-0 18 17.9 3/8 (SUTURE) ×1 IMPLANT
SYR CONTROL 10ML LL (SYRINGE) IMPLANT
TOWEL OR 17X24 6PK STRL BLUE (TOWEL DISPOSABLE) ×3 IMPLANT
TOWEL OR 17X26 10 PK STRL BLUE (TOWEL DISPOSABLE) ×3 IMPLANT
TUBE CONNECTING 12'X1/4 (SUCTIONS) ×2
TUBE CONNECTING 12X1/4 (SUCTIONS) ×4 IMPLANT
UNDERPAD 30X30 (UNDERPADS AND DIAPERS) ×6 IMPLANT
WATER STERILE IRR 1000ML POUR (IV SOLUTION) ×3 IMPLANT

## 2018-12-16 NOTE — Transfer of Care (Signed)
Immediate Anesthesia Transfer of Care Note  Patient: [name]  Procedure(s) Performed: Left elbow radial head resection and arthroplasty with ligament repair as necessary (Left Elbow)  Patient Location: PACU  Anesthesia Type:GA combined with regional for post-op pain  Level of Consciousness: drowsy  Airway & Oxygen Therapy: Patient Spontanous Breathing and Patient connected to face mask oxygen  Post-op Assessment: Report given to RN and Post -op Vital signs reviewed and stable  Post vital signs: Reviewed and stable  Last Vitals:  Vitals Value Taken Time  BP 124/74 12/16/2018 11:40 AM  Temp    Pulse 90 12/16/2018 11:43 AM  Resp 20 12/16/2018 11:43 AM  SpO2 96 % 12/16/2018 11:43 AM  Vitals shown include unvalidated device data.  Last Pain:  Vitals:   12/16/18 0732  TempSrc: Oral  PainSc: 10-Worst pain ever      Patients Stated Pain Goal: 4 (12/16/18 0732)  Complications: No apparent anesthesia complications

## 2018-12-16 NOTE — H&P (Signed)
[name] is an 37 y.o. female.   Chief Complaint: Left elbow fracture comminuted radial head in nature HPI: Patient presents for reconstruction left elbow.  I reviewed all issues with her at length and will proceed accordingly with radial head resection and arthroplasty with ligamentous reconstruction repair is necessary.  She understands all risk and benefits.  She does have some slight numbness on the dorsum of her fingers however the median and ulnar nerve as well as radial nerve motor function appears to be nicely stable.  Patient presents for evaluation and treatment of the of their upper extremity predicament. The patient denies neck, back, chest or  abdominal pain. The patient notes that they have no lower extremity problems. The patients primary complaint is noted. We are planning surgical care pathway for the upper extremity.  Past Medical History:  Diagnosis Date  . Asthma   . GERD (gastroesophageal reflux disease)   . History of chicken pox   . Pneumonia 2017  . UTI (urinary tract infection)     Past Surgical History:  Procedure Laterality Date  . CESAREAN SECTION  09/27/12  . NO PAST SURGERIES      Family History  Problem Relation Age of Onset  . Allergies Sister   . Cancer Sister        fallopian tube  . Fibroids Mother        Malignant  . Diabetes Father   . Colon cancer Maternal Grandfather   . Diabetes Paternal Grandmother   . Liver cancer Paternal Aunt   . Colon polyps Neg Hx   . Esophageal cancer Neg Hx   . Stomach cancer Neg Hx   . Rectal cancer Neg Hx    Social History:  reports that she quit smoking about 16 years ago. She has never used smokeless tobacco. She reports that she does not drink alcohol or use drugs.  Allergies:  Allergies  Allergen Reactions  . Aspirin Hives  . Levaquin [Levofloxacin In D5w] Hives  . Pamprin [Apap-Pamabrom-Pyrilamine] Hives    Medications Prior to Admission  Medication Sig Dispense Refill  .  budesonide-formoterol (SYMBICORT) 160-4.5 MCG/ACT inhaler Inhale 2 puffs into the lungs 2 (two) times daily. 1 Inhaler 0  . cetirizine (ZYRTEC) 10 MG tablet Take 10 mg by mouth at bedtime.    Marland Kitchen. doxycycline (VIBRAMYCIN) 100 MG capsule Take 1 capsule (100 mg total) by mouth 2 (two) times daily. 20 capsule 0  . famotidine (PEPCID) 20 MG tablet TAKE 1 TABLET (20 MG TOTAL) BY MOUTH AT BEDTIME. 30 tablet 3  . HYDROcodone-acetaminophen (NORCO) 5-325 MG tablet Take 1 tablet by mouth every 6 (six) hours as needed (for pain). 20 tablet 0  . ibuprofen (ADVIL,MOTRIN) 200 MG tablet Take 1,000 mg by mouth every 8 (eight) hours as needed (for pain.).    Marland Kitchen. levalbuterol (XOPENEX) 1.25 MG/3ML nebulizer solution Take 1.25 mg by nebulization every 4 (four) hours as needed for wheezing. 72 mL 0  . montelukast (SINGULAIR) 10 MG tablet Take 1 tablet (10 mg total) by mouth at bedtime. 30 tablet 11  . Multiple Vitamin (MULTIVITAMIN WITH MINERALS) TABS tablet Take 1 tablet by mouth daily. Women's One A Day    . omeprazole (PRILOSEC) 40 MG capsule Take 1 capsule (40 mg total) by mouth daily. 30 capsule 11  . predniSONE (DELTASONE) 10 MG tablet Take 10-60 mg by mouth See admin instructions. Take these numbers of tablets on consecutive days 6-6-5-5-4-4-3-3-2-2-1-1    . Albuterol Sulfate (PROAIR RESPICLICK) 108 (  90 Base) MCG/ACT AEPB Inhale 2 puffs into the lungs every 6 (six) hours as needed. 1 each 11  . benzonatate (TESSALON) 100 MG capsule Take 1-2 capsules (100-200 mg total) by mouth 3 (three) times daily as needed for cough. (Patient not taking: Reported on 12/15/2018) 45 capsule 0  . fluticasone (FLONASE) 50 MCG/ACT nasal spray PLACE 2 SPRAYS INTO BOTH NOSTRILS ONCE DAILY (Patient not taking: Reported on 12/15/2018) 16 g 2    Results for orders placed or performed during the hospital encounter of 12/16/18 (from the past 48 hour(s))  Pregnancy, urine POC     Status: None   Collection Time: 12/16/18  7:11 AM  Result Value  Ref Range   Preg Test, Ur NEGATIVE NEGATIVE    Comment:        THE SENSITIVITY OF THIS METHODOLOGY IS >24 mIU/mL    No results found.  Review of Systems  Respiratory: Negative.   Cardiovascular: Negative.   Gastrointestinal: Negative.   Genitourinary: Negative.     Blood pressure 122/66, pulse 80, temperature 98.4 F (36.9 C), temperature source Oral, resp. rate 18, height 5\' 6"  (1.676 m), weight 131.1 kg, SpO2 97 %. Physical Exam  Comminuted complex radial head fracture with extrusion of portions of the radial head.  We will plan for left elbow radial head repair reconstruction.  She has sensation in the median and ulnar nerve distribution.  She can flex and extend her fingers.  No compartment syndrome findings.  No evidence of dystrophic reaction.  We discussed all issues in detail.  The patient is alert and oriented in no acute distress. The patient complains of pain in the affected upper extremity.  The patient is noted to have a normal HEENT exam. Lung fields show equal chest expansion and no shortness of breath. Abdomen exam is nontender without distention. Lower extremity examination does not show any fracture dislocation or blood clot symptoms. Pelvis is stable and the neck and back are stable and nontender. Assessment/Plan  We are planning surgery for your upper extremity. The risk and benefits of surgery to include risk of bleeding, infection, anesthesia,  damage to normal structures and failure of the surgery to accomplish its intended goals of relieving symptoms and restoring function have been discussed in detail. With this in mind we plan to proceed. I have specifically discussed with the patient the pre-and postoperative regime and the dos and don'ts and risk and benefits in great detail. Risk and benefits of surgery also include risk of dystrophy(CRPS), chronic nerve pain, failure of the healing process to go onto completion and other inherent risks of surgery The  relavent the pathophysiology of the disease/injury process, as well as the alternatives for treatment and postoperative course of action has been discussed in great detail with the patient who desires to proceed.  We will do everything in our power to help you (the patient) restore function to the upper extremity. It is a pleasure to see this patient today.   Oletta CohnWilliam M Meghana Tullo III, MD 12/16/2018, 8:18 AM

## 2018-12-16 NOTE — Anesthesia Preprocedure Evaluation (Signed)
Anesthesia Evaluation  Patient identified by MRN, date of birth, ID band Patient awake    Reviewed: Allergy & Precautions, NPO status , Patient's Chart, lab work & pertinent test results  Airway Mallampati: III  TM Distance: >3 FB Neck ROM: Full    Dental  (+) Teeth Intact, Dental Advisory Given   Pulmonary asthma , former smoker,    breath sounds clear to auscultation       Cardiovascular  Rhythm:Regular Rate:Normal     Neuro/Psych Anxiety Depression    GI/Hepatic Neg liver ROS, GERD  Medicated,  Endo/Other  negative endocrine ROS  Renal/GU negative Renal ROS     Musculoskeletal negative musculoskeletal ROS (+)   Abdominal (+) + obese,   Peds  Hematology negative hematology ROS (+)   Anesthesia Other Findings   Reproductive/Obstetrics                             Anesthesia Physical Anesthesia Plan  ASA: III  Anesthesia Plan: General   Post-op Pain Management: GA combined w/ Regional for post-op pain   Induction: Intravenous  PONV Risk Score and Plan: 4 or greater and Ondansetron, Dexamethasone, Midazolam, Treatment may vary due to age or medical condition and Scopolamine patch - Pre-op  Airway Management Planned: LMA  Additional Equipment: None  Intra-op Plan:   Post-operative Plan: Extubation in OR  Informed Consent: I have reviewed the patients History and Physical, chart, labs and discussed the procedure including the risks, benefits and alternatives for the proposed anesthesia with the patient or authorized representative who has indicated his/her understanding and acceptance.   Dental advisory given  Plan Discussed with: CRNA  Anesthesia Plan Comments: (Albuterol in Short stay. )        Anesthesia Quick Evaluation

## 2018-12-16 NOTE — Progress Notes (Addendum)
Consent form filled out at bedside by Dr. Amanda PeaGramig, signed by patient, and witnessed by OR RN. I assisted patient with removal of 3 earring. Bilateral tragus rings and one outer earring. These were placed in a denture cup and given to mother at bedside.   1 bag of belongings taken to PACU. Patient's mother reports that she has all valuables.

## 2018-12-16 NOTE — Anesthesia Procedure Notes (Signed)
Procedure Name: Intubation Performed by: Milford Cage, CRNA Pre-anesthesia Checklist: Patient identified, Emergency Drugs available, Suction available and Patient being monitored Patient Re-evaluated:Patient Re-evaluated prior to induction Oxygen Delivery Method: Circle System Utilized Preoxygenation: Pre-oxygenation with 100% oxygen Induction Type: IV induction Ventilation: Mask ventilation without difficulty Laryngoscope Size: Miller and 3 Grade View: Grade I Tube type: Oral Tube size: 7.0 mm Number of attempts: 2 Airway Equipment and Method: Stylet and Oral airway Placement Confirmation: ETT inserted through vocal cords under direct vision,  positive ETCO2 and breath sounds checked- equal and bilateral Secured at: 23 cm Tube secured with: Tape Dental Injury: Teeth and Oropharynx as per pre-operative assessment  Comments: G3 with MAC 3 -  G1 with Mil3

## 2018-12-16 NOTE — Progress Notes (Signed)
Patient ID: [name], female   DOB: 1981-12-02, 37 y.o.   MRN: [id number] Stable postop without complications.  I discussed all issues with patient in great detail.  Her surgery went very nicely.  We will continue to monitor her neurovascular status.  She has a very dense block at this time.  She looks quite well.  Chest is stable.  She is tolerating her diet at this time.  Nicole Gergen MD

## 2018-12-16 NOTE — Anesthesia Procedure Notes (Signed)
Anesthesia Regional Block: Supraclavicular block   Pre-Anesthetic Checklist: ,, timeout performed, Correct Patient, Correct Site, Correct Laterality, Correct Procedure, Correct Position, site marked, Risks and benefits discussed,  Surgical consent,  Pre-op evaluation,  At surgeon's request and post-op pain management  Laterality: Left  Prep: chloraprep       Needles:  Injection technique: Single-shot  Needle Type: Echogenic Stimulator Needle     Needle Length: 9cm  Needle Gauge: 21     Additional Needles:   Procedures:,,,, ultrasound used (permanent image in chart),,,,  Narrative:  Start time: 12/16/2018 9:10 AM End time: 12/16/2018 9:15 AM Injection made incrementally with aspirations every 5 mL.  Performed by: Personally  Anesthesiologist: Shelton SilvasHollis, Kevin D, MD  Additional Notes: Patient tolerated the procedure well. Local anesthetic introduced in an incremental fashion under minimal resistance after negative aspirations. No paresthesias were elicited. After completion of the procedure, no acute issues were identified and patient continued to be monitored by RN.

## 2018-12-16 NOTE — Anesthesia Postprocedure Evaluation (Signed)
Anesthesia Post Note  Patient: [name]  Procedure(s) Performed: Left elbow radial head resection and arthroplasty with ligament repair as necessary (Left Elbow)     Patient location during evaluation: PACU Anesthesia Type: General Level of consciousness: awake and alert Pain management: pain level controlled Vital Signs Assessment: post-procedure vital signs reviewed and stable Respiratory status: spontaneous breathing, nonlabored ventilation, respiratory function stable and patient connected to nasal cannula oxygen Cardiovascular status: blood pressure returned to baseline and stable Postop Assessment: no apparent nausea or vomiting Anesthetic complications: no    Last Vitals:  Vitals:   12/16/18 1156 12/16/18 1209  BP: (!) 104/52 115/73  Pulse: 85 73  Resp: 16 13  Temp:  (!) 36.4 C  SpO2: 95% 94%    Last Pain:  Vitals:   12/16/18 1140  TempSrc:   PainSc: 0-No pain                 Shelton SilvasKevin D Hollis

## 2018-12-16 NOTE — Op Note (Signed)
See dict#[id number] SP left elbow reconstruction Saiquan Hands MD

## 2018-12-16 NOTE — Op Note (Signed)
NAMLeone Stanton: BUNDY, Jess B. MEDICAL RECORD [id number] ACCOUNT [id number] DATE OF BIRTH:1981/05/18 FACILITY: [facility] LOCATION: [facility] PHYSICIAN:Karita Dralle M. Nataniel Gasper, MD  OPERATIVE REPORT  DATE OF PROCEDURE:  12/16/2018  PREOPERATIVE DIAGNOSIS:  Comminuted fragmented and extruded radial head fracture, left elbow with ligamentous injury.  POSTOPERATIVE DIAGNOSIS:  Comminuted fragmented and extruded radial head fracture, left elbow with ligamentous injury.  PROCEDURE:   1.  Radial head resection and subsequent radial head arthroplasty with a Biomet system utilizing a size 7 stem.  This was a radial head arthroplasty with radial head excision, left elbow. 2.  Lateral and the collateral ligament repair, left elbow. 3.  Five-view radiographic series performed examined and interpreted by myself, left elbow.  SURGEON:  Dominica SeverinWilliam Lucilla Petrenko, MD  ASSISTANT:  None.  COMPLICATIONS:  None.  ANESTHESIA:  General with preoperative block.  TOURNIQUET TIME:  Less than 70 minutes.  INDICATIONS:  This is a 37 year old female with significant injury.  I have counseled her in regards to risks and benefits of surgery including risk of infection, bleeding, anesthesia, damage to normal structures and failure of surgery to accomplish this  intended goal is relieving symptoms and restoring function.  With this in mind, she desires to proceed.  All questions have been encouraged and answered preoperatively.  OPERATIVE PROCEDURE:  The patient was seen by myself and anesthesia and taken to the operative theater and underwent a general anesthetic in the form of a general endotracheal anesthetic.  She underwent a Hibiclens prescrub followed by a 10-minute  surgical Betadine scrub and paint.  Once this was complete, sterile field was secured.  Timeout observed.  Sterile tourniquet then applied and insufflated.  A Kocher incision was made.  I entered the interval between the ECU and anconeus very carefully  dissected down and  noted the extruded fragment of the radial head.  This was removed without difficulty and was embedded in the soft tissues.  The radial head was certainly not a candidate for ORIF given these issues and features.  Following this, we  then dissected down and further noted the lateral ulnar collateral ligament injury and following this very carefully and cautiously dissected down and performed a release of a portion of the annular ligament followed by accessing the additional radial  head fragments.  These were removed without difficulty and assembled on the back table for purposes of reassembly and sizing.  Following this, I very carefully placed Bennett retractors in the forearm was held in pronation and used an oscillating saw  very carefully sculpted the proximal end of the radial shaft.  I then used a planer to smooth this and checked this under AP, lateral and oblique x-rays to make sure that the cut was very perfect looking.  I was pleased with the position and the cut.   Following this, we then once again sized the stem.  A series of passages of the planer followed by the broaching to a size 7 stem was accomplished.  The size 7 stem was then placed for trial and we then placed a 20 x 10 mm head.  The patient tolerated  this well.  There were no complicating features.  I very carefully looked at this under radiographic image with the trial in to make sure there was no overstuffing of the joint and it all looked quite well.  It did look very well and there were no  complicating features.  Once this was performed, we then irrigated copiously, removed any bone dust as we did during  multiple points during the procedure and then implanted the final construct.  The #7 stem was placed, impacted nicely.  There was a good  fit.  Following this, we then placed the head on top of the stem without difficulty.  This worked well.  Final copy x-rays were taken.  The patient was taken through with a smooth arc of motion  and there were no issues.  Thus, a 7 mm stem and size 20 mm  head from the Biomet system were used.  Good stability, no overstuffing and good smooth arc of motion was appreciated.  I then placed a JuggerKnot in a point of isometry and then repaired the lateral ulnar collateral ligament with an imbricating stitch technique.  Following this, I repaired the annular ligament and fascia without difficulty.  I placed the patient through  a range of motion once again.  All looked well.  There was no locking, popping, catching, clicking or other issues.  There was a very smooth full arc of motion.  Once this was complete, we irrigated copiously, closed the wound with tourniquet deflated  with 3-0 Prolene and placed in a standard dressing with long arm splint.  There were no complicating features.  The patient tolerated the procedure nicely.  There were no issues, complications or other problems.  She will be admitted for IV antibiotics, general postoperative observation and other measures according to my standard algorithm and postoperative  routine.  At the time of first postoperative visit, we will plan for suture removal and downstairs for a splint to begin well arm assisted range of motion and eliminate the last 30-40 degrees of extension until she is week 4.  These notes have been  discussed and all questions have been encouraged and answered.  TN/NUANCE  D:12/16/2018 T:12/16/2018 JOB:[id number]

## 2018-12-17 DIAGNOSIS — S52122A Displaced fracture of head of left radius, initial encounter for closed fracture: Secondary | ICD-10-CM | POA: Diagnosis not present

## 2018-12-17 MED ORDER — PREDNISONE 10 MG PO TABS
10.0000 mg | ORAL_TABLET | Freq: Every day | ORAL | Status: DC
  Administered 2018-12-17: 10 mg via ORAL
  Filled 2018-12-17: qty 1

## 2018-12-17 MED ORDER — PANTOPRAZOLE SODIUM 40 MG PO TBEC
40.0000 mg | DELAYED_RELEASE_TABLET | Freq: Every day | ORAL | Status: DC
  Administered 2018-12-17: 40 mg via ORAL
  Filled 2018-12-17: qty 1

## 2018-12-17 NOTE — Discharge Instructions (Signed)
Please call for any problems.  Please keep your bandage clean and dry.  Once again remember that you will have significant pain the first 48 hours.  Elevate move and massage her fingers.  Notify should any issues occur otherwise we will see you in 14 days for suture removal and begin your therapy protocol.  Keep bandage clean and dry.  Call for any problems.  No smoking.  Criteria for driving a car: you should be off your pain medicine for 7-8 hours, able to drive one handed(confident), thinking clearly and feeling able in your judgement to drive. Continue elevation as it will decrease swelling.  If instructed by MD move your fingers within the confines of the bandage/splint.  Use ice if instructed by your MD. Call immediately for any sudden loss of feeling in your hand/arm or change in functional abilities of the extremity.   We recommend that you to take vitamin C 1000 mg a day to promote healing. We also recommend that if you require  pain medicine that you take a stool softener to prevent constipation as most pain medicines will have constipation side effects. We recommend either Peri-Colace or Senokot and recommend that you also consider adding MiraLAX as well to prevent the constipation affects from pain medicine if you are required to use them. These medicines are over the counter and may be purchased at a local pharmacy. A cup of yogurt and a probiotic can also be helpful during the recovery process as the medicines can disrupt your intestinal environment.

## 2018-12-17 NOTE — Plan of Care (Signed)

## 2018-12-17 NOTE — Plan of Care (Signed)
°  Problem: Education: °Goal: Knowledge of General Education information will improve °Description: Including pain rating scale, medication(s)/side effects and non-pharmacologic comfort measures °Outcome: Progressing °  °Problem: Health Behavior/Discharge Planning: °Goal: Ability to manage health-related needs will improve °Outcome: Progressing °  °Problem: Clinical Measurements: °Goal: Ability to maintain clinical measurements within normal limits will improve °Outcome: Progressing °Goal: Will remain free from infection °Outcome: Progressing °Goal: Respiratory complications will improve °Outcome: Progressing °Goal: Cardiovascular complication will be avoided °Outcome: Progressing °  °Problem: Activity: °Goal: Risk for activity intolerance will decrease °Outcome: Progressing °  °Problem: Coping: °Goal: Level of anxiety will decrease °Outcome: Progressing °  °

## 2018-12-17 NOTE — Discharge Summary (Signed)
Physician Discharge Summary  Patient ID: [name] MRN: [id number] DOB/AGE: 05-12-1981 37 y.o.  Admit date: 12/16/2018 Discharge date:   Admission Diagnoses: Left radial head fracture Past Medical History:  Diagnosis Date  . Asthma   . GERD (gastroesophageal reflux disease)   . History of chicken pox   . Pneumonia 2017  . UTI (urinary tract infection)     Discharge Diagnoses:  Active Problems:   Left elbow fracture, closed, initial encounter   Surgeries: Procedure(s): Left elbow radial head resection and arthroplasty with ligament repair as necessary on 12/16/2018    Consultants:   Discharged Condition: Improved  Hospital Course: [name] is an 37 y.o. female who was admitted 12/16/2018 with a chief complaint of No chief complaint on file. , and found to have a diagnosis of Left radial head fracture.  They were brought to the operating room on 12/16/2018 and underwent Procedure(s): Left elbow radial head resection and arthroplasty with ligament repair as necessary.    They were given perioperative antibiotics:  Anti-infectives (From admission, onward)   Start     Dose/Rate Route Frequency Ordered Stop   12/16/18 2145  ceFAZolin (ANCEF) IVPB 1 g/50 mL premix     1 g 100 mL/hr over 30 Minutes Intravenous Every 8 hours 12/16/18 1230     12/16/18 1345  ceFAZolin (ANCEF) IVPB 1 g/50 mL premix     1 g 100 mL/hr over 30 Minutes Intravenous NOW 12/16/18 1230 12/16/18 1549   12/16/18 0900  ceFAZolin (ANCEF) 3 g in dextrose 5 % 50 mL IVPB     3 g 100 mL/hr over 30 Minutes Intravenous  Once 12/16/18 0809 12/16/18 0940    .  They were given sequential compression devices, early ambulation, and Other (comment) for DVT prophylaxis.  Recent vital signs:  Patient Vitals for the past 24 hrs:  BP Temp Temp src Pulse Resp SpO2  12/17/18 0529 (!) 108/53 98.2 F (36.8 C) Oral 72 16 96 %  12/17/18 0010 127/70 98.4 F (36.9 C) Oral 83 16 95 %  12/16/18 2116 - - - - - 96 %   12/16/18 2044 137/82 98.2 F (36.8 C) Oral 76 16 95 %  12/16/18 1500 122/75 97.9 F (36.6 C) Oral 72 16 98 %  12/16/18 1300 - - - - - 94 %  12/16/18 1209 115/73 (!) 97.5 F (36.4 C) - 73 13 94 %  12/16/18 1156 (!) 104/52 - - 85 16 95 %  12/16/18 1154 (!) 86/53 - - 76 13 91 %  12/16/18 1141 - - - 85 15 96 %  12/16/18 1140 124/74 (!) 97.5 F (36.4 C) - 87 14 96 %  12/16/18 0918 - - - 83 13 97 %  12/16/18 0917 - - - 78 13 98 %  12/16/18 0916 - - - 84 12 97 %  12/16/18 0915 - - - 89 12 98 %  12/16/18 0914 131/75 - - 92 15 99 %  12/16/18 0913 - - - 85 (!) 9 98 %  12/16/18 0912 (!) 157/77 - - 90 13 100 %  12/16/18 0911 - - - 93 19 99 %  12/16/18 0910 - - - 82 15 100 %  .  Recent laboratory studies: No results found.  Discharge Medications:   Allergies as of 12/17/2018      Reactions   Aspirin Hives   Levaquin [levofloxacin In D5w] Hives   Pamprin [apap-pamabrom-pyrilamine] Hives      Medication List  STOP taking these medications   HYDROcodone-acetaminophen 5-325 MG tablet Commonly known as:  NORCO   ibuprofen 200 MG tablet Commonly known as:  ADVIL,MOTRIN     TAKE these medications   Albuterol Sulfate 108 (90 Base) MCG/ACT Aepb Commonly known as:  PROAIR RESPICLICK Inhale 2 puffs into the lungs every 6 (six) hours as needed.   benzonatate 100 MG capsule Commonly known as:  TESSALON Take 1-2 capsules (100-200 mg total) by mouth 3 (three) times daily as needed for cough.   budesonide-formoterol 160-4.5 MCG/ACT inhaler Commonly known as:  SYMBICORT Inhale 2 puffs into the lungs 2 (two) times daily.   cetirizine 10 MG tablet Commonly known as:  ZYRTEC Take 10 mg by mouth at bedtime.   doxycycline 100 MG capsule Commonly known as:  VIBRAMYCIN Take 1 capsule (100 mg total) by mouth 2 (two) times daily.   famotidine 20 MG tablet Commonly known as:  PEPCID TAKE 1 TABLET (20 MG TOTAL) BY MOUTH AT BEDTIME.   fluticasone 50 MCG/ACT nasal spray Commonly known  as:  FLONASE PLACE 2 SPRAYS INTO BOTH NOSTRILS ONCE DAILY   levalbuterol 1.25 MG/3ML nebulizer solution Commonly known as:  XOPENEX Take 1.25 mg by nebulization every 4 (four) hours as needed for wheezing.   montelukast 10 MG tablet Commonly known as:  SINGULAIR Take 1 tablet (10 mg total) by mouth at bedtime.   multivitamin with minerals Tabs tablet Take 1 tablet by mouth daily. Women's One A Day   omeprazole 40 MG capsule Commonly known as:  PRILOSEC Take 1 capsule (40 mg total) by mouth daily.   predniSONE 10 MG tablet Commonly known as:  DELTASONE Take 10-60 mg by mouth See admin instructions. Take these numbers of tablets on consecutive days 6-6-5-5-4-4-3-3-2-2-1-1       Diagnostic Studies: Dg Chest 2 View  Result Date: 12/09/2018 CLINICAL DATA:  Pt has hx of asthma. Reports increased SOB since 4pm EXAM: CHEST - 2 VIEW COMPARISON:  01/04/2017 FINDINGS: Both views are mildly degraded by patient body habitus. Mild convex right thoracic spine curvature. Midline trachea. Normal heart size and mediastinal contours. No pleural effusion or pneumothorax. Diffuse peribronchial thickening. Clear lungs. IMPRESSION: 1. No acute cardiopulmonary disease. 2. Mild peribronchial thickening which may relate to chronic bronchitis or asthma. Electronically Signed   By: Jeronimo GreavesKyle  Talbot M.D.   On: 12/09/2018 21:13   Dg Elbow Complete Left  Result Date: 12/12/2018 CLINICAL DATA:  Status post fall, with injury to the left elbow. Left posterior elbow pain. Initial encounter. EXAM: LEFT ELBOW - COMPLETE 3+ VIEW COMPARISON:  None. FINDINGS: There is a significantly displaced and comminuted fracture involving the proximal radius, with marked rotation of the larger fragment. An associated elbow joint effusion is noted. The proximal ulna is grossly unremarkable. Soft tissue swelling is noted about the elbow. IMPRESSION: Significantly displaced and comminuted fracture involving the proximal radius, with marked  rotation of the larger fragment. Associated elbow joint effusion noted. Electronically Signed   By: Roanna RaiderJeffery  Chang M.D.   On: 12/12/2018 01:02   Dg Wrist Complete Left  Result Date: 12/12/2018 CLINICAL DATA:  Status post fall, with left posterior wrist pain. Initial encounter. EXAM: LEFT WRIST - COMPLETE 3+ VIEW COMPARISON:  None. FINDINGS: There is no evidence of fracture or dislocation. The carpal rows are intact, and demonstrate normal alignment. The joint spaces are preserved. No significant soft tissue abnormalities are seen. IMPRESSION: No evidence of fracture or dislocation. Electronically Signed   By: Beryle BeamsJeffery  Chang M.D.  On: 12/12/2018 01:02    They benefited maximally from their hospital stay and there were no complications.     Disposition: Discharge disposition: 01-Home or Self Care      Discharge Instructions    Call MD / Call 911   Complete by:  As directed    If you experience chest pain or shortness of breath, CALL 911 and be transported to the hospital emergency room.  If you develope a fever above 101 F, pus (white drainage) or increased drainage or redness at the wound, or calf pain, call your surgeon's office.   Constipation Prevention   Complete by:  As directed    Drink plenty of fluids.  Prune juice may be helpful.  You may use a stool softener, such as Colace (over the counter) 100 mg twice a day.  Use MiraLax (over the counter) for constipation as needed.   Diet - low sodium heart healthy   Complete by:  As directed    Increase activity slowly as tolerated   Complete by:  As directed       Status post left elbow radial head resection and arthroplasty with collateral ligament reconstruction 12/16/2018.  She is stable.  Her block is still in place although she is beginning to have some thumb movement.  We will discharge her after lunch.  I will have her continue doxycycline and her prednisone as she was on prior to her surgery.  I will phone her in oxycodone  5 mg 1-2 every 6 hours as needed pain and Robaxin.  At the time of discharge she is awake alert and oriented no signs of DVT, UTI, or other complicating feature.  I discussed all issues and plans.  We will see her back in the office in 2 weeks and I will have my office call and coordinate this visit of course.  Signed: Dionne Ano Demetries Coia III 12/17/2018, 7:34 AM

## 2018-12-18 ENCOUNTER — Encounter (HOSPITAL_COMMUNITY): Payer: Self-pay | Admitting: Orthopedic Surgery

## 2018-12-18 ENCOUNTER — Telehealth: Payer: Self-pay

## 2018-12-18 NOTE — Telephone Encounter (Signed)
Called patient to see if TCM appointment needed with provider. Patient states she will be following up with Orthopedics.

## 2018-12-25 ENCOUNTER — Other Ambulatory Visit: Payer: Self-pay | Admitting: Medical

## 2018-12-25 ENCOUNTER — Other Ambulatory Visit: Payer: Self-pay

## 2018-12-25 ENCOUNTER — Other Ambulatory Visit: Payer: Self-pay | Admitting: Internal Medicine

## 2019-01-08 ENCOUNTER — Encounter: Payer: Self-pay | Admitting: Osteopathic Medicine

## 2019-01-08 ENCOUNTER — Ambulatory Visit (INDEPENDENT_AMBULATORY_CARE_PROVIDER_SITE_OTHER): Payer: Managed Care, Other (non HMO) | Admitting: Osteopathic Medicine

## 2019-01-08 VITALS — BP 109/58 | HR 83 | Temp 97.5°F | Ht 66.0 in | Wt 292.6 lb

## 2019-01-08 DIAGNOSIS — R635 Abnormal weight gain: Secondary | ICD-10-CM

## 2019-01-08 DIAGNOSIS — J4540 Moderate persistent asthma, uncomplicated: Secondary | ICD-10-CM | POA: Diagnosis not present

## 2019-01-08 DIAGNOSIS — Z23 Encounter for immunization: Secondary | ICD-10-CM

## 2019-01-08 DIAGNOSIS — R6889 Other general symptoms and signs: Secondary | ICD-10-CM

## 2019-01-08 MED ORDER — MOMETASONE FURO-FORMOTEROL FUM 200-5 MCG/ACT IN AERO: 2.0000 | INHALATION_SPRAY | Freq: Two times a day (BID) | RESPIRATORY_TRACT | 3 refills | Status: DC

## 2019-01-08 MED ORDER — OMEPRAZOLE 40 MG PO CPDR: 40.0000 mg | DELAYED_RELEASE_CAPSULE | Freq: Every day | ORAL | 0 refills | Status: DC

## 2019-01-08 NOTE — Patient Instructions (Addendum)
   Will stop Symbicort and try Dulera  Will get you set up with allergist   Labs today   Flu shot today  Omeprazole to 40 mg daily for 6 weeks, and see if this helps the cough

## 2019-01-08 NOTE — Progress Notes (Signed)
HPI: [name] is a 38 y.o. female who  has a past medical history of Asthma, GERD (gastroesophageal reflux disease), History of chicken pox, Pneumonia (2017), and UTI (urinary tract infection).  she presents to [facility] today, 01/08/19,  for chief complaint of: New to establish care Getting over a cold/flu illness  Asthma  Recent illness, sore throat, cough. GI bug - sems better past few days.    Depression: history of. No concerns now.   Hx asthma/allergies: on Zyrtec daily, Singulair qhs, Symbicort bid, Albuterol prn. Previously following with Pulm. Would like to get set up for allergy testing.   Weight gain - on and off prednisone tapers through pulm.   GERD: on omeprazole and famotidine. Colonoscopy in 2018 d/t lower GI bleeding, grandfather has hx colon CA.   S/p L elbow surgery 11/2018  Rare EtOH, never smoker.   G2P2. Has IUD in place. 2+ partners in past year, declines STI screening. Cisgender female, heterosexual. Due for Pap - sees OBGYN     Past medical, surgical, social and family history reviewed:  Patient Active Problem List   Diagnosis Date Noted  . Left elbow fracture, closed, initial encounter 12/16/2018  . Rhinitis, chronic 05/11/2018  . Diarrhea 11/19/2016  . Rectal bleeding 11/19/2016  . Abdominal pain 11/19/2016  . Asthma exacerbation 10/10/2016  . Respiratory failure, acute (HCC) 10/10/2016  . ADD (attention deficit disorder) 10/10/2016  . Depression with anxiety 08/13/2014  . Morbid obesity due to excess calories (HCC) 08/13/2014  . Leukocytosis 11/27/2012  . Dehydration 11/27/2012  . Sinus tachycardia 11/27/2012  . Hypokalemia 11/26/2012  . Extrinsic asthma 08/17/2011    Past Surgical History:  Procedure Laterality Date  . CESAREAN SECTION  09/27/12  . NO PAST SURGERIES    . ORIF ELBOW FRACTURE Left 12/16/2018   Procedure: Left elbow radial head resection and arthroplasty with ligament repair as  necessary;  Surgeon: Dominica Severin, MD;  Location: [facility] OR;  Service: Orthopedics;  Laterality: Left;  2 hrs    Social History   Tobacco Use  . Smoking status: Former Smoker    Last attempt to quit: 2004    Years since quitting: 16.0  . Smokeless tobacco: Never Used  . Tobacco comment: smoked 1-2 months then quit  Substance Use Topics  . Alcohol use: No    Family History  Problem Relation Age of Onset  . Allergies Sister   . Cancer Sister        fallopian tube  . Fibroids Mother        Malignant  . Diabetes Father   . Colon cancer Maternal Grandfather   . Diabetes Paternal Grandmother   . Liver cancer Paternal Aunt   . Colon polyps Neg Hx   . Esophageal cancer Neg Hx   . Stomach cancer Neg Hx   . Rectal cancer Neg Hx      Current medication list and allergy/intolerance information reviewed:    Current Outpatient Medications  Medication Sig Dispense Refill  . Albuterol Sulfate (PROAIR RESPICLICK) 108 (90 Base) MCG/ACT AEPB Inhale 2 puffs into the lungs every 6 (six) hours as needed. 1 each 11  . budesonide-formoterol (SYMBICORT) 160-4.5 MCG/ACT inhaler Inhale 2 puffs into the lungs 2 (two) times daily. 1 Inhaler 0  . cetirizine (ZYRTEC) 10 MG tablet Take 10 mg by mouth at bedtime.    . famotidine (PEPCID) 20 MG tablet TAKE 1 TABLET (20 MG TOTAL) BY MOUTH AT BEDTIME. 30 tablet 3  .  fluticasone (FLONASE) 50 MCG/ACT nasal spray PLACE 2 SPRAYS INTO BOTH NOSTRILS ONCE DAILY 16 g 2  . levalbuterol (XOPENEX) 1.25 MG/3ML nebulizer solution Take 1.25 mg by nebulization every 4 (four) hours as needed for wheezing. 72 mL 0  . montelukast (SINGULAIR) 10 MG tablet Take 1 tablet (10 mg total) by mouth at bedtime. 30 tablet 11  . Multiple Vitamin (MULTIVITAMIN WITH MINERALS) TABS tablet Take 1 tablet by mouth daily. Women's One A Day    . omeprazole (PRILOSEC) 40 MG capsule TAKE ONE CAPSULE BY MOUTH DAILY 30 capsule 11  . benzonatate (TESSALON) 100 MG capsule Take 1-2 capsules (100-200  mg total) by mouth 3 (three) times daily as needed for cough. (Patient not taking: Reported on 12/15/2018) 45 capsule 0  . doxycycline (VIBRAMYCIN) 100 MG capsule Take 1 capsule (100 mg total) by mouth 2 (two) times daily. (Patient not taking: Reported on 01/08/2019) 20 capsule 0  . predniSONE (DELTASONE) 10 MG tablet Take 10-60 mg by mouth See admin instructions. Take these numbers of tablets on consecutive days 6-6-5-5-4-4-3-3-2-2-1-1     No current facility-administered medications for this visit.     Allergies  Allergen Reactions  . Aspirin Hives  . Levaquin [Levofloxacin In D5w] Hives  . Pamprin [Apap-Pamabrom-Pyrilamine] Hives      Review of Systems:  Constitutional:  No  fever, no chills, +recent illness, +unintentional weight changes. +significant fatigue.   HEENT: No  headache, no vision change, no hearing change, No sore throat, No  sinus pressure  Cardiac: No  chest pain, No  pressure, No palpitations, No  Orthopnea  Respiratory:  No  shortness of breath. No  Cough  Gastrointestinal: No  abdominal pain, No  nausea, No  vomiting,  No  blood in stool, No  diarrhea, No  constipation   Musculoskeletal: No new myalgia/arthralgia  Skin: No  Rash, No other wounds/concerning lesions  Genitourinary: No  incontinence, No  abnormal genital bleeding, No abnormal genital discharge  Hem/Onc: No  easy bruising/bleeding, No  abnormal lymph node  Endocrine: +cold intolerance,  +heat intolerance. No polyuria/polydipsia, +polyphagia   Neurologic: No  weakness, No  dizziness, No  slurred speech/focal weakness/facial droop  Psychiatric: + history with depression, No  concerns with anxiety, No sleep problems, No mood problems  Depression screen [facility] 2/9 01/08/2019 06/21/2016  Decreased Interest 1 3  Down, Depressed, Hopeless 1 3  PHQ - 2 Score 2 6  Altered sleeping 3 2  Tired, decreased energy 2 3  Change in appetite 2 3  Feeling bad or failure about yourself  3 2  Trouble  concentrating 1 3  Moving slowly or fidgety/restless 0 3  Suicidal thoughts 0 1  PHQ-9 Score 13 23  Difficult doing work/chores Extremely dIfficult Very difficult    Exam:  BP (!) 109/58 (BP Location: Left Arm, Patient Position: Sitting, Cuff Size: Normal)   Pulse 83   Temp (!) 97.5 F (36.4 C) (Oral)   Ht 5\' 6"  (1.676 m)   Wt 292 lb 9.6 oz (132.7 kg)   BMI 47.23 kg/m   Constitutional: VS see above. General Appearance: alert, well-developed, well-nourished, NAD  Eyes: Normal lids and conjunctive, non-icteric sclera  Ears, Nose, Mouth, Throat: MMM, Normal external inspection ears/nares/mouth/lips/gums. TM normal bilaterally. Pharynx/tonsils no erythema, no exudate. Nasal mucosa normal.   Neck: No masses, trachea midline. No thyroid enlargement. No tenderness/mass appreciated. No lymphadenopathy  Respiratory: Normal respiratory effort. no wheeze, no rhonchi, no rales  Cardiovascular: S1/S2 normal, no murmur, no  rub/gallop auscultated. RRR. No lower extremity edema.   Gastrointestinal: Nontender, no masses. No hepatomegaly, no splenomegaly. No hernia appreciated. Bowel sounds normal. Rectal exam deferred.   Musculoskeletal: Gait normal. No clubbing/cyanosis of digits.   Neurological: Normal balance/coordination. No tremor. No cranial nerve deficit on limited exam. Motor and sensation intact and symmetric. Cerebellar reflexes intact.   Skin: warm, dry, intact. No rash/ulcer. No concerning nevi or subq nodules on limited exam.    Psychiatric: Normal judgment/insight. Normal mood and affect. Oriented x3.      ASSESSMENT/PLAN: The primary encounter diagnosis was Need for influenza vaccination. Diagnoses of Moderate persistent asthma, unspecified whether complicated, Heat intolerance, and Abnormal weight gain were also pertinent to this visit.  Orders Placed This Encounter  Procedures  . Flu Vaccine QUAD 6+ mos PF IM (Fluarix Quad PF)  . CBC with Differential/Platelet  .  COMPLETE METABOLIC PANEL WITH GFR  . Lipid panel  . TSH  . Ambulatory referral to Allergy    Meds ordered this encounter  Medications  . omeprazole (PRILOSEC) 40 MG capsule    Sig: Take 1 capsule (40 mg total) by mouth daily.    Dispense:  90 capsule    Refill:  0  . mometasone-formoterol (DULERA) 200-5 MCG/ACT AERO    Sig: Inhale 2 puffs into the lungs 2 (two) times daily.    Dispense:  2 Inhaler    Refill:  3    Patient Instructions   Will stop Symbicort and try Dulera  Will get you set up with allergist   Labs today   Flu shot today  Omeprazole to 40 mg daily for 6 weeks, and see if this helps the cough         Visit summary with medication list and pertinent instructions was printed for patient to review. All questions at time of visit were answered - patient instructed to contact office with any additional concerns or updates. ER/RTC precautions were reviewed with the patient.     Please note: voice recognition software was used to produce this document, and typos may escape review. Please contact Dr. Lyn HollingsheadAlexander for any needed clarifications.     Follow-up plan: Return in about 6 weeks (around 02/19/2019) for recheck asthma - sooner if needed!.Marland Kitchen

## 2019-01-09 LAB — LIPID PANEL
Cholesterol: 164 mg/dL (ref <200)
HDL: 36 mg/dL — AB (ref >50)
LDL Cholesterol (Calc): 103 mg/dL (calc) — ABNORMAL HIGH
Non-HDL Cholesterol (Calc): 128 mg/dL (calc) (ref <130)
Total CHOL/HDL Ratio: 4.6 (calc) (ref <5.0)
Triglycerides: 153 mg/dL — ABNORMAL HIGH (ref <150)

## 2019-01-09 LAB — CBC WITH DIFFERENTIAL/PLATELET
Absolute Monocytes: 768 cells/uL (ref 200–950)
Basophils Absolute: 51 cells/uL (ref 0–200)
Basophils Relative: 0.8 %
Eosinophils Absolute: 339 cells/uL (ref 15–500)
Eosinophils Relative: 5.3 %
HCT: 36.2 % (ref 35.0–45.0)
Hemoglobin: 12.5 g/dL (ref 11.7–15.5)
Lymphs Abs: 1722 cells/uL (ref 850–3900)
MCH: 30.6 pg (ref 27.0–33.0)
MCHC: 34.5 g/dL (ref 32.0–36.0)
MCV: 88.7 fL (ref 80.0–100.0)
MPV: 10.5 fL (ref 7.5–12.5)
Monocytes Relative: 12 %
Neutro Abs: 3520 cells/uL (ref 1500–7800)
Neutrophils Relative %: 55 %
PLATELETS: 299 10*3/uL (ref 140–400)
RBC: 4.08 10*6/uL (ref 3.80–5.10)
RDW: 13.3 % (ref 11.0–15.0)
TOTAL LYMPHOCYTE: 26.9 %
WBC: 6.4 10*3/uL (ref 3.8–10.8)

## 2019-01-09 LAB — COMPLETE METABOLIC PANEL WITH GFR
AG Ratio: 1.5 (calc) (ref 1.0–2.5)
ALT: 24 U/L (ref 6–29)
AST: 17 U/L (ref 10–30)
Albumin: 3.7 g/dL (ref 3.6–5.1)
Alkaline phosphatase (APISO): 69 U/L (ref 33–115)
BUN: 7 mg/dL (ref 7–25)
CO2: 26 mmol/L (ref 20–32)
Calcium: 8.7 mg/dL (ref 8.6–10.2)
Chloride: 108 mmol/L (ref 98–110)
Creat: 0.73 mg/dL (ref 0.50–1.10)
GFR, Est African American: 123 mL/min/{1.73_m2} (ref >=60)
GFR, Est Non African American: 106 mL/min/{1.73_m2} (ref >=60)
Globulin: 2.5 g/dL (calc) (ref 1.9–3.7)
Glucose, Bld: 103 mg/dL (ref 65–139)
Potassium: 3.9 mmol/L (ref 3.5–5.3)
Sodium: 141 mmol/L (ref 135–146)
Total Bilirubin: 0.3 mg/dL (ref 0.2–1.2)
Total Protein: 6.2 g/dL (ref 6.1–8.1)

## 2019-01-09 LAB — TSH: TSH: 1.17 mIU/L

## 2019-01-24 ENCOUNTER — Ambulatory Visit: Payer: Managed Care, Other (non HMO) | Admitting: Allergy

## 2019-01-24 ENCOUNTER — Encounter: Payer: Self-pay | Admitting: Allergy

## 2019-01-24 ENCOUNTER — Telehealth: Payer: Self-pay

## 2019-01-24 VITALS — BP 128/78 | HR 104 | Temp 97.9°F | Resp 20 | Ht 66.0 in | Wt 293.8 lb

## 2019-01-24 DIAGNOSIS — Z8709 Personal history of other diseases of the respiratory system: Secondary | ICD-10-CM | POA: Diagnosis not present

## 2019-01-24 DIAGNOSIS — J4550 Severe persistent asthma, uncomplicated: Secondary | ICD-10-CM | POA: Diagnosis not present

## 2019-01-24 DIAGNOSIS — J3089 Other allergic rhinitis: Secondary | ICD-10-CM | POA: Insufficient documentation

## 2019-01-24 DIAGNOSIS — B999 Unspecified infectious disease: Secondary | ICD-10-CM | POA: Insufficient documentation

## 2019-01-24 MED ORDER — FLUTICASONE PROPIONATE 50 MCG/ACT NA SUSP: NASAL | 2 refills | Status: DC

## 2019-01-24 MED ORDER — TIOTROPIUM BROMIDE MONOHYDRATE 1.25 MCG/ACT IN AERS: 2.0000 | INHALATION_SPRAY | Freq: Every day | RESPIRATORY_TRACT | 2 refills | Status: DC

## 2019-01-24 NOTE — Assessment & Plan Note (Signed)
>>  ASSESSMENT AND PLAN FOR HISTORY OF FREQUENT UPPER RESPIRATORY INFECTION WRITTEN ON 01/24/2019 12:19 PM BY Ellamae SiaKIM, Mee Macdonnell M, DO  History of frequent upper respiratory infections including sinusitis, pneumonia and ear infections.  No previous immune evaluation.  Get blood work as below.  Keep track of infections.

## 2019-01-24 NOTE — Assessment & Plan Note (Signed)
Patient was diagnosed with asthma over 30 years ago.  She was doing well up until 2012 when she got really ill and was hospitalized.  Recently started on Dulera 200 2 puffs twice a day which has been helping better than the Symbicort.  Still using albuterol rescue inhaler on a daily basis and had multiple courses of prednisone the last year with 2 ER visits and at least 6 urgent care visits as well.  Checks x-ray in December 2019 showed mild peribronchial thickening which may relate to chronic bronchitis or asthma.  Patient was followed by pulmonology in the past.  Today's spirometry showed restriction however did have a 20% improvement in FEV1 post bronchodilator treatment.  Today skin prick testing was also positive to grass, tree, cat, dog, horse, mouse, ragweed, weed, mold.  She does have 2 cats at home.  Given current clinical symptoms patient's asthma is not well controlled.  We will check IgE level and eosinophil level to see if she meets criteria for any biologic treatment. . Daily controller medication(s):  Continue Dulera 200 2 puffs twice a day with spacer rinse mouth afterwards. o Start Spiriva 1.87mcg 2 puffs daily.  Demonstrated proper use. o Continue Singulair 10mg  daily.  . Prior to physical activity: May use albuterol rescue inhaler 2 puffs 5 to 15 minutes prior to strenuous physical activities. Marland Kitchen Rescue medications: May use albuterol rescue inhaler 2 puffs or nebulizer every 4 to 6 hours as needed for shortness of breath, chest tightness, coughing, and wheezing. Monitor frequency of use.

## 2019-01-24 NOTE — Telephone Encounter (Signed)
PA approved.

## 2019-01-24 NOTE — Assessment & Plan Note (Signed)
History of frequent upper respiratory infections including sinusitis, pneumonia and ear infections.  No previous immune evaluation.  Get blood work as below.  Keep track of infections.

## 2019-01-24 NOTE — Telephone Encounter (Signed)
PA initiated through [email or web address] [name] (Key: [id number])  Rx #: [id number]  Spiriva Respimat 1.25MCG/ACT aerosol

## 2019-01-24 NOTE — Patient Instructions (Addendum)
Today's skin testing showed: Positive to grass, tree, cat, dog, horse, mouse, ragweed, weed, mold.  May use over the counter antihistamines such as Zyrtec (cetirizine), Claritin (loratadine), Allegra (fexofenadine), or Xyzal (levocetirizine) daily as needed.  Start Flonase 1-2 sprays daily.   Get bloodwork - CBC diff, IgE, IgG, IgA, IgM, s pneumo, tetanus. Keep track of infections.   Asthma:  Look into asthma injections. We will choose depending on your bloodwork results.  . Daily controller medication(s):  Continue Dulera 200 2 puffs twice a day with spacer rinse mouth afterwards. o Start Spiriva 2 puffs daily. o Continue Singulair 10mg  daily.  . Prior to physical activity: May use albuterol rescue inhaler 2 puffs 5 to 15 minutes prior to strenuous physical activities. Marland Kitchen Rescue medications: May use albuterol rescue inhaler 2 puffs or nebulizer every 4 to 6 hours as needed for shortness of breath, chest tightness, coughing, and wheezing. Monitor frequency of use.  . Asthma control goals:  o Full participation in all desired activities (may need albuterol before activity) o Albuterol use two times or less a week on average (not counting use with activity) o Cough interfering with sleep two times or less a month o Oral steroids no more than once a year o No hospitalizations  Follow up in 4 weeks.   Reducing Pollen Exposure . Pollen seasons: trees (spring), grass (summer) and ragweed/weeds (fall). Marland Kitchen Keep windows closed in your home and car to lower pollen exposure.  Lilian Kapur air conditioning in the bedroom and throughout the house if possible.  . Avoid going out in dry windy days - especially early morning. . Pollen counts are highest between 5 - 10 AM and on dry, hot and windy days.  . Save outside activities for late afternoon or after a heavy rain, when pollen levels are lower.  . Avoid mowing of grass if you have grass pollen allergy. Marland Kitchen Be aware that pollen can also be  transported indoors on people and pets.  . Dry your clothes in an automatic dryer rather than hanging them outside where they might collect pollen.  . Rinse hair and eyes before bedtime. Pet Allergen Avoidance: . Contrary to popular opinion, there are no "hypoallergenic" breeds of dogs or cats. That is because people are not allergic to an animal's hair, but to an allergen found in the animal's saliva, dander (dead skin flakes) or urine. Pet allergy symptoms typically occur within minutes. For some people, symptoms can build up and become most severe 8 to 12 hours after contact with the animal. People with severe allergies can experience reactions in public places if dander has been transported on the pet owners' clothing. Marland Kitchen Keeping an animal outdoors is only a partial solution, since homes with pets in the yard still have higher concentrations of animal allergens. . Before getting a pet, ask your allergist to determine if you are allergic to animals. If your pet is already considered part of your family, try to minimize contact and keep the pet out of the bedroom and other rooms where you spend a great deal of time. . As with dust mites, vacuum carpets often or replace carpet with a hardwood floor, tile or linoleum. . High-efficiency particulate air (HEPA) cleaners can reduce allergen levels over time. . While dander and saliva are the source of cat and dog allergens, urine is the source of allergens from rabbits, hamsters, mice and [place] pigs; so ask a non-allergic family member to clean the animal's cage. . If you  have a pet allergy, talk to your allergist about the potential for allergy immunotherapy (allergy shots). This strategy can often provide long-term relief. Mold Control . Mold and fungi can grow on a variety of surfaces provided certain temperature and moisture conditions exist.  . Outdoor molds grow on plants, decaying vegetation and soil. The major outdoor mold, Alternaria and  Cladosporium, are found in very high numbers during hot and dry conditions. Generally, a late summer - fall peak is seen for common outdoor fungal spores. Rain will temporarily lower outdoor mold spore count, but counts rise rapidly when the rainy period ends. . The most important indoor molds are Aspergillus and Penicillium. Dark, humid and poorly ventilated basements are ideal sites for mold growth. The next most common sites of mold growth are the bathroom and the kitchen. Outdoor (Seasonal) Mold Control . Use air conditioning and keep windows closed. . Avoid exposure to decaying vegetation. Marland Kitchen Avoid leaf raking. . Avoid grain handling. . Consider wearing a face mask if working in moldy areas.  Indoor (Perennial) Mold Control  . Maintain humidity below 50%. . Get rid of mold growth on hard surfaces with water, detergent and, if necessary, 5% bleach (do not mix with other cleaners). Then dry the area completely. If mold covers an area more than 10 square feet, consider hiring an indoor environmental professional. . For clothing, washing with soap and water is best. If moldy items cannot be cleaned and dried, throw them away. . Remove sources e.g. contaminated carpets. . Repair and seal leaking roofs or pipes. Using dehumidifiers in damp basements may be helpful, but empty the water and clean units regularly to prevent mildew from forming. All rooms, especially basements, bathrooms and kitchens, require ventilation and cleaning to deter mold and mildew growth. Avoid carpeting on concrete or damp floors, and storing items in damp areas.

## 2019-01-24 NOTE — Assessment & Plan Note (Addendum)
Perennial rhinoconjunctivitis symptoms with worsening in the fall and winter for 30+ years.  Has tried Zyrtec, Singulair and Flonase as needed with some benefit.  Patient had blood work about 5 years ago which was apparently positive to cats, dogs, tree, grass and mold per patient report.  No recent ENT evaluation.  Today skin testing was positive to: grass, tree, cat, dog, horse, mouse, ragweed, weed, mold.  Discussed environmental control measures.  May use over the counter antihistamines such as Zyrtec (cetirizine), Claritin (loratadine), Allegra (fexofenadine), or Xyzal (levocetirizine) daily as needed.  Start Flonase 1-2 sprays daily.  Demonstrated proper use.  Had a detailed discussion with patient/family that clinical history is suggestive of allergic rhinitis, and may benefit from allergy immunotherapy (AIT). Discussed in detail regarding the dosing, schedule, side effects (mild to moderate local allergic reaction and rarely systemic allergic reactions including anaphylaxis), and benefits (significant improvement in nasal symptoms, seasonal flares of asthma) of immunotherapy with the patient. There is significant time commitment involved with allergy shots, which includes weekly immunotherapy injections and then biweekly to monthly injections for 3-5 years.   Will start allergy immunotherapy once asthma is in better control.

## 2019-01-24 NOTE — Progress Notes (Signed)
New Patient Note  RE: [name] MRN: [id number] DOB: 1981/06/03 Date of Office Visit: 01/24/2019  Referring provider: Sunnie Nielsen, DO Primary care provider: Sunnie Nielsen, DO  Chief Complaint: New Patient (Initial Visit) (on and off prednisone x 2 yrs using neb and rescue inhaler too much low oxygen ) and Asthma (asthma attacks and has had pnuemonia)  History of Present Illness: I had the pleasure of seeing [name] for initial evaluation at the [facility] on 01/24/2019. She is a 38 y.o. female, who is referred here by Sunnie Nielsen, DO for the evaluation of asthma. Patient is accompanied today by her mother.   Asthma: She reports symptoms of chest tightness, shortness of breath, coughing, wheezing, nocturnal awakenings for 30+ years. Current medications include Dulera 200 2 puffs BID x 1 month, xopenex nebulizer, Singulair 10mg  daily which helps. She reports using aerochamber with asthma inhalers. She tried the following inhalers: Symbicort did not work as well, Advair used to work until she had her 2nd child. Main asthma triggers are infections, weather changes, exercise, dusting, pet exposure. In the last month, frequency of asthma symptoms: daily. Frequency of nocturnal symptoms: depends. Frequency of SABA use: daily. Interference with physical activity: yes. In the last 12 months, emergency room visits/urgent care visits/doctor office visits or hospitalizations due to asthma: 2 ER visits and 6 times in the urgent care. In the last 12 months, oral steroids courses: yes multiple courses which helps. Lifetime history of hospitalization for asthma: yes several times. Prior intubations: no. Asthma was diagnosed at age childhood. History of pneumonia: yes. She was evaluated by pulmonologist in the past. Smoking exposure: no. Up to date with flu vaccine: yes.  Patient apparently lived in a house which had mold issues and got really ill in 2012 and was  hospitalized. Since then her lungs haven't really been the same.   12/09/2018 CXR: IMPRESSION: 1. No acute cardiopulmonary disease. 2. Mild peribronchial thickening which may relate to chronic bronchitis or asthma.  Rhinitis:  She reports symptoms of itchy eyes/nose, rhinorrhea, nasal congestion. Symptoms have been going on for 30+ years. The symptoms are present all year around with worsening in fall and winter. Other triggers include exposure to cats. Anosmia: no. Headache: yes. She has used zyrtec, Singulair, Flonase prn with some improvement in symptoms. Sinus infections: not sure. Previous work up includes: 2014-2015 immunocap in the past which was positive to cats, dogs, tree, grass, mold per patient report.  Previous ENT evaluation: not recently Previous sinus imaging: no.  Assessment and Plan: [name] is a 38 y.o. female with: Not well controlled severe persistent asthma Patient was diagnosed with asthma over 30 years ago.  She was doing well up until 2012 when she got really ill and was hospitalized.  Recently started on Dulera 200 2 puffs twice a day which has been helping better than the Symbicort.  Still using albuterol rescue inhaler on a daily basis and had multiple courses of prednisone the last year with 2 ER visits and at least 6 urgent care visits as well.  Checks x-ray in December 2019 showed mild peribronchial thickening which may relate to chronic bronchitis or asthma.  Patient was followed by pulmonology in the past.  Today's spirometry showed restriction however did have a 20% improvement in FEV1 post bronchodilator treatment.  Today skin prick testing was also positive to grass, tree, cat, dog, horse, mouse, ragweed, weed, mold.  She does have 2 cats at home.  Given  current clinical symptoms patient's asthma is not well controlled.  We will check IgE level and eosinophil level to see if she meets criteria for any biologic treatment. . Daily controller medication(s):   Continue Dulera 200 2 puffs twice a day with spacer rinse mouth afterwards. o Start Spiriva 1.72mcg 2 puffs daily.  Demonstrated proper use. o Continue Singulair 10mg  daily.  . Prior to physical activity: May use albuterol rescue inhaler 2 puffs 5 to 15 minutes prior to strenuous physical activities. Marland Kitchen Rescue medications: May use albuterol rescue inhaler 2 puffs or nebulizer every 4 to 6 hours as needed for shortness of breath, chest tightness, coughing, and wheezing. Monitor frequency of use.   Other allergic rhinitis Perennial rhinoconjunctivitis symptoms with worsening in the fall and winter for 30+ years.  Has tried Zyrtec, Singulair and Flonase as needed with some benefit.  Patient had blood work about 5 years ago which was apparently positive to cats, dogs, tree, grass and mold per patient report.  No recent ENT evaluation.  Today skin testing was positive to: grass, tree, cat, dog, horse, mouse, ragweed, weed, mold.  Discussed environmental control measures.  May use over the counter antihistamines such as Zyrtec (cetirizine), Claritin (loratadine), Allegra (fexofenadine), or Xyzal (levocetirizine) daily as needed.  Start Flonase 1-2 sprays daily.  Demonstrated proper use.  Had a detailed discussion with patient/family that clinical history is suggestive of allergic rhinitis, and may benefit from allergy immunotherapy (AIT). Discussed in detail regarding the dosing, schedule, side effects (mild to moderate local allergic reaction and rarely systemic allergic reactions including anaphylaxis), and benefits (significant improvement in nasal symptoms, seasonal flares of asthma) of immunotherapy with the patient. There is significant time commitment involved with allergy shots, which includes weekly immunotherapy injections and then biweekly to monthly injections for 3-5 years.   Will start allergy immunotherapy once asthma is in better control.  History of frequent upper respiratory  infection History of frequent upper respiratory infections including sinusitis, pneumonia and ear infections.  No previous immune evaluation.  Get blood work as below.  Keep track of infections.  Return in about 4 weeks (around 02/21/2019).  Meds ordered this encounter  Medications  . Tiotropium Bromide Monohydrate (SPIRIVA RESPIMAT) 1.25 MCG/ACT AERS    Sig: Inhale 2 puffs into the lungs daily.    Dispense:  4 g    Refill:  2  . fluticasone (FLONASE) 50 MCG/ACT nasal spray    Sig: Use 1-2 sprays per nostril daily    Dispense:  16 g    Refill:  2    2ND REQUEST    Lab Orders     CBC with Differential     IGE     IgG, IgA, IgM     Strep pneumoniae 23 Serotypes IgG     Diphtheria / Tetanus Antibody Panel  Other allergy screening: Asthma: yes Rhino conjunctivitis: yes Food allergy: no Medication allergy: yes  Aspirin - hives Levaquin - hives Pamprin - hives Hymenoptera allergy: no Urticaria: no Eczema:no History of recurrent infections suggestive of immunodeficency:  Patient has history multiple infections including sinus infection, pneumonia, ear infections. Denies any GI infections/diarrhea, skin infections/abscesses. Patient also has no history of opportunistic infections including fungal infections, viral infections.    Patient reports several antibiotic use in the last 12 months and 0 hospital admissions. Patient does not have any secondary causes of immunodeficiency including diabetes mellitus, protein losing enteropathy, renal or hepatic dysfunction, history of cancer or irradiation or history of HIV, hepatitis  B or C. Patient has been on and off steroids.   Diagnostics: Spirometry:  Tracings reviewed. Her effort: Good reproducible efforts. FVC: 2.16L FEV1: 1.86L, 56% predicted FEV1/FVC ratio: 86% Interpretation: Spirometry consistent with possible restrictive disease with 20% improvement in FEV1 post bronchodilator treatment. Please see scanned spirometry results  for details.  Skin Testing: Environmental allergy panel. Positive test to: grass, tree, cat, dog, horse, mouse, ragweed, weed, mold. Results discussed with patient/family. Airborne Adult Perc - 01/24/19 0953    Time Antigen Placed  16100952    Allergen Manufacturer  Waynette ButteryGreer    Location  Back    Number of Test  59    Panel 1  Select    1. Control-Buffer 50% Glycerol  Negative    2. Control-Histamine 1 mg/ml  4+    3. Albumin saline  Negative    4. Bahia  4+    5. [place]  Negative    6. Johnson  Negative    7. Kentucky Blue  2+    8. Meadow Fescue  3+    9. Perennial Rye  4+    10. Sweet Vernal  4+    11. Timothy  Negative    12. Cocklebur  Negative    13. Burweed Marshelder  Negative    14. Ragweed, short  Negative    15. Ragweed, Giant  Negative    16. Plantain,  English  Negative    17. Lamb's Quarters  Negative    18. Sheep Sorrell  Negative    19. Rough Pigweed  Negative    20. Marsh Elder, Rough  Negative    21. Mugwort, Common  Negative    22. Ash mix  Negative    23. Birch mix  Negative    24. Beech American  Negative    25. Box, Elder  Negative    26. Cedar, red  4+    27. Cottonwood, Guinea-BissauEastern  Negative    28. Elm mix  Negative    29. Hickory mix  Negative    30. Maple mix  Negative    31. Oak, Guinea-Bissau[place]astern mix  Negative    32. Pecan Pollen  Negative    33. Pine mix  Negative    34. Sycamore Eastern  Negative    35. Walnut, Black Pollen  Negative    36. Alternaria alternata  Negative    37. Cladosporium Herbarum  Negative    38. Aspergillus mix  Negative    39. Penicillium mix  Negative    40. Bipolaris sorokiniana (Helminthosporium)  Negative    41. Drechslera spicifera (Curvularia)  Negative    42. Mucor plumbeus  Negative    43. Fusarium moniliforme  Negative    44. Aureobasidium pullulans (pullulara)  Negative    45. Rhizopus oryzae  Negative    46. Botrytis cinera  Negative    47. Epicoccum nigrum  Negative    48. Phoma betae  Negative    49. Candida  Albicans  Negative    50. Trichophyton mentagrophytes  4+    51. Mite, D Farinae  5,000 AU/ml  Negative    52. Mite, D Pteronyssinus  5,000 AU/ml  Negative    53. Cat Hair 10,000 BAU/ml  4+    54.  Dog Epithelia  4+    55. Mixed Feathers  Negative    56. Horse Epithelia  4+    57. Cockroach, German  Negative    58. Mouse  4+  59. Tobacco Leaf  Negative     Intradermal - 01/24/19 1019    Time Antigen Placed  1016    Allergen Manufacturer  Greer    Location  Arm    Number of Test  11    Intradermal  Select    Control  Negative    [place]  4+    Johnson  4+    Ragweed mix  2+    Weed mix  3+    Tree mix  Negative    Mold 1  Negative    Mold 2  3+    Mold 3  2+    Mold 4  Negative    Mite mix  Negative       Past Medical History: Patient Active Problem List   Diagnosis Date Noted  . Not well controlled severe persistent asthma 01/24/2019  . Other allergic rhinitis 01/24/2019  . History of frequent upper respiratory infection 01/24/2019  . Left elbow fracture, closed, initial encounter 12/16/2018  . Rhinitis, chronic 05/11/2018  . Diarrhea 11/19/2016  . Rectal bleeding 11/19/2016  . Abdominal pain 11/19/2016  . Asthma exacerbation 10/10/2016  . ADD (attention deficit disorder) 10/10/2016  . Depression with anxiety 08/13/2014  . Morbid obesity due to excess calories (HCC) 08/13/2014  . Leukocytosis 11/27/2012  . Dehydration 11/27/2012  . Sinus tachycardia 11/27/2012  . Hypokalemia 11/26/2012  . Extrinsic asthma 08/17/2011   Past Medical History:  Diagnosis Date  . Asthma   . GERD (gastroesophageal reflux disease)   . History of chicken pox   . Pneumonia 2017  . UTI (urinary tract infection)    Past Surgical History: Past Surgical History:  Procedure Laterality Date  . CESAREAN SECTION  09/27/12  . NO PAST SURGERIES    . ORIF ELBOW FRACTURE Left 12/16/2018   Procedure: Left elbow radial head resection and arthroplasty with ligament repair as necessary;   Surgeon: Dominica Severin, MD;  Location: [facility] OR;  Service: Orthopedics;  Laterality: Left;  2 hrs   Medication List:  Current Outpatient Medications  Medication Sig Dispense Refill  . Albuterol Sulfate (PROAIR RESPICLICK) 108 (90 Base) MCG/ACT AEPB Inhale 2 puffs into the lungs every 6 (six) hours as needed. 1 each 11  . cetirizine (ZYRTEC) 10 MG tablet Take 10 mg by mouth at bedtime.    . famotidine (PEPCID) 20 MG tablet TAKE 1 TABLET (20 MG TOTAL) BY MOUTH AT BEDTIME. 30 tablet 3  . levalbuterol (XOPENEX) 1.25 MG/3ML nebulizer solution Take 1.25 mg by nebulization every 4 (four) hours as needed for wheezing. 72 mL 0  . mometasone-formoterol (DULERA) 200-5 MCG/ACT AERO Inhale 2 puffs into the lungs 2 (two) times daily. 2 Inhaler 3  . montelukast (SINGULAIR) 10 MG tablet Take 1 tablet (10 mg total) by mouth at bedtime. 30 tablet 11  . Multiple Vitamin (MULTIVITAMIN WITH MINERALS) TABS tablet Take 1 tablet by mouth daily. Women's One A Day    . omeprazole (PRILOSEC) 40 MG capsule Take 1 capsule (40 mg total) by mouth daily. 90 capsule 0  . fluticasone (FLONASE) 50 MCG/ACT nasal spray Use 1-2 sprays per nostril daily 16 g 2  . Tiotropium Bromide Monohydrate (SPIRIVA RESPIMAT) 1.25 MCG/ACT AERS Inhale 2 puffs into the lungs daily. 4 g 2   No current facility-administered medications for this visit.    Allergies: Allergies  Allergen Reactions  . Aspirin Hives  . Levaquin [Levofloxacin In D5w] Hives  . Pamprin [Apap-Pamabrom-Pyrilamine] Hives   Social History: Social History  Socioeconomic History  . Marital status: Divorced    Spouse name: Not on file  . Number of children: 2  . Years of education: Not on file  . Highest education level: Not on file  Occupational History  . Occupation: Stay at home Mother    Employer: [employer]  Social Needs  . Financial resource strain: Not on file  . Food insecurity:    Worry: Not on file    Inability: Not on file  .  Transportation needs:    Medical: Not on file    Non-medical: Not on file  Tobacco Use  . Smoking status: Former Smoker    Last attempt to quit: 2004    Years since quitting: 16.1  . Smokeless tobacco: Never Used  . Tobacco comment: smoked 1-2 months then quit  Substance and Sexual Activity  . Alcohol use: No  . Drug use: No  . Sexual activity: Yes    Birth control/protection: I.U.D.  Lifestyle  . Physical activity:    Days per week: Not on file    Minutes per session: Not on file  . Stress: Not on file  Relationships  . Social connections:    Talks on phone: Not on file    Gets together: Not on file    Attends religious service: Not on file    Active member of club or organization: Not on file    Attends meetings of clubs or organizations: Not on file    Relationship status: Not on file  Other Topics Concern  . Not on file  Social History Narrative  . Not on file   Lives in a 1976 built home Smoking: denies Occupation: account specialist.  Environmental History: Water Damage/mildew in the house: yes Carpet in the family room: yes Carpet in the bedroom: yes Heating: gas Cooling: central Pet: yes 2 cats  Family History: Family History  Problem Relation Age of Onset  . Allergies Sister   . Cancer Sister        fallopian tube  . Fibroids Mother        Malignant  . Asthma Mother   . Diabetes Father   . Colon cancer Maternal Grandfather   . Diabetes Paternal Grandmother   . Liver cancer Paternal Aunt   . Asthma Maternal Uncle   . Asthma Maternal Grandmother   . Colon polyps Neg Hx   . Esophageal cancer Neg Hx   . Stomach cancer Neg Hx   . Rectal cancer Neg Hx   . Allergic rhinitis Neg Hx    Review of Systems  Constitutional: Negative for appetite change, chills, fever and unexpected weight change.  HENT: Positive for congestion and sneezing. Negative for rhinorrhea.   Eyes: Negative for itching.  Respiratory: Positive for cough, chest tightness,  shortness of breath and wheezing.   Cardiovascular: Negative for chest pain.  Gastrointestinal: Negative for abdominal pain.  Genitourinary: Negative for difficulty urinating.  Skin: Negative for rash.  Allergic/Immunologic: Negative for environmental allergies and food allergies.  Neurological: Negative for headaches.   Objective: BP 128/78 (BP Location: Left Arm, Patient Position: Sitting, Cuff Size: Large)   Pulse (!) 104   Temp 97.9 F (36.6 C) (Oral)   Resp 20   Ht 5\' 6"  (1.676 m)   Wt 293 lb 12.8 oz (133.3 kg)   SpO2 94%   BMI 47.42 kg/m  Body mass index is 47.42 kg/m. Physical Exam  Constitutional: She is oriented to person, place, and time. She appears  well-developed and well-nourished.  HENT:  Head: Normocephalic and atraumatic.  Right Ear: External ear normal.  Left Ear: External ear normal.  Nose: Nose normal.  Mouth/Throat: Oropharynx is clear and moist.  Eyes: Conjunctivae and EOM are normal.  Neck: Neck supple.  Cardiovascular: Normal rate, regular rhythm and normal heart sounds. Exam reveals no gallop and no friction rub.  No murmur heard. Pulmonary/Chest: Effort normal and breath sounds normal. She has no wheezes. She has no rales.  Abdominal: Soft.  Lymphadenopathy:    She has no cervical adenopathy.  Neurological: She is alert and oriented to person, place, and time.  Skin: Skin is warm. No rash noted.  Psychiatric: She has a normal mood and affect. Her behavior is normal.  Nursing note and vitals reviewed.  The plan was reviewed with the patient/family, and all questions/concerned were addressed.  It was my pleasure to see Omolara today and participate in her care. Please feel free to contact me with any questions or concerns.  Sincerely,  Wyline Mood, DO Allergy & Immunology  [facility] office: [phone] [facility] office: [phone]

## 2019-01-30 LAB — STREP PNEUMONIAE 23 SEROTYPES IGG
PNEUMO AB TYPE 20: 3.6 ug/mL (ref >1.3)
PNEUMO AB TYPE 57 (19A): 20.6 ug/mL (ref >1.3)
Pneumo Ab Type 1*: >13.1 ug/mL (ref >1.3)
Pneumo Ab Type 12 (12F)*: 7.3 ug/mL (ref >1.3)
Pneumo Ab Type 14*: >29.1 ug/mL (ref >1.3)
Pneumo Ab Type 17 (17F)*: 1.4 ug/mL (ref >1.3)
Pneumo Ab Type 19 (19F)*: 19.4 ug/mL (ref >1.3)
Pneumo Ab Type 2*: 1.2 ug/mL — ABNORMAL LOW (ref >1.3)
Pneumo Ab Type 22 (22F)*: 1.4 ug/mL (ref >1.3)
Pneumo Ab Type 23 (23F)*: 0.6 ug/mL — ABNORMAL LOW (ref >1.3)
Pneumo Ab Type 26 (6B)*: 3.6 ug/mL (ref >1.3)
Pneumo Ab Type 3*: >55.8 ug/mL (ref >1.3)
Pneumo Ab Type 34 (10A)*: >28.8 ug/mL (ref >1.3)
Pneumo Ab Type 4*: 2.6 ug/mL (ref >1.3)
Pneumo Ab Type 43 (11A)*: 7.6 ug/mL (ref >1.3)
Pneumo Ab Type 5*: >74 ug/mL (ref >1.3)
Pneumo Ab Type 51 (7F)*: 8.3 ug/mL (ref >1.3)
Pneumo Ab Type 54 (15B)*: 21.4 ug/mL (ref >1.3)
Pneumo Ab Type 56 (18C)*: 2.6 ug/mL (ref >1.3)
Pneumo Ab Type 68 (9V)*: 1.1 ug/mL — ABNORMAL LOW (ref >1.3)
Pneumo Ab Type 70 (33F)*: >14.4 ug/mL (ref >1.3)
Pneumo Ab Type 8*: 0.9 ug/mL — ABNORMAL LOW (ref >1.3)
Pneumo Ab Type 9 (9N)*: 4.6 ug/mL (ref >1.3)

## 2019-01-30 LAB — CBC WITH DIFFERENTIAL/PLATELET
Basophils Absolute: 0.1 10*3/uL (ref 0.0–0.2)
Basos: 1 %
EOS (ABSOLUTE): 0.5 10*3/uL — ABNORMAL HIGH (ref 0.0–0.4)
Eos: 6 %
Hematocrit: 39.8 % (ref 34.0–46.6)
Hemoglobin: 13.5 g/dL (ref 11.1–15.9)
Immature Grans (Abs): 0 10*3/uL (ref 0.0–0.1)
Immature Granulocytes: 0 %
Lymphocytes Absolute: 1.8 10*3/uL (ref 0.7–3.1)
Lymphs: 20 %
MCH: 29.9 pg (ref 26.6–33.0)
MCHC: 33.9 g/dL (ref 31.5–35.7)
MCV: 88 fL (ref 79–97)
Monocytes Absolute: 0.8 10*3/uL (ref 0.1–0.9)
Monocytes: 9 %
Neutrophils Absolute: 5.7 10*3/uL (ref 1.4–7.0)
Neutrophils: 64 %
Platelets: 383 10*3/uL (ref 150–450)
RBC: 4.51 x10E6/uL (ref 3.77–5.28)
RDW: 13.6 % (ref 11.7–15.4)
WBC: 8.9 10*3/uL (ref 3.4–10.8)

## 2019-01-30 LAB — DIPHTHERIA / TETANUS ANTIBODY PANEL
DIPHTHERIA AB: 1.37 [IU]/mL (ref <0.10)
Tetanus Ab, IgG: 2.05 IU/mL (ref <0.10)

## 2019-01-30 LAB — IGE: IgE (Immunoglobulin E), Serum: 179 IU/mL (ref 6–495)

## 2019-01-30 LAB — IGG, IGA, IGM
IgA/Immunoglobulin A, Serum: 150 mg/dL (ref 87–352)
IgG (Immunoglobin G), Serum: 905 mg/dL (ref 700–1600)
IgM (Immunoglobulin M), Srm: 209 mg/dL (ref 26–217)

## 2019-01-31 ENCOUNTER — Encounter: Payer: Self-pay | Admitting: Allergy

## 2019-02-19 ENCOUNTER — Ambulatory Visit: Payer: Self-pay | Admitting: Osteopathic Medicine

## 2019-02-21 ENCOUNTER — Ambulatory Visit: Payer: Self-pay | Admitting: Osteopathic Medicine

## 2019-02-21 ENCOUNTER — Telehealth: Payer: Self-pay | Admitting: Osteopathic Medicine

## 2019-02-21 NOTE — Telephone Encounter (Signed)
Patient called after appointment time to reshcdule. Appointment made for 02/28/2019. No further questions at this time.

## 2019-02-22 ENCOUNTER — Ambulatory Visit: Payer: Managed Care, Other (non HMO) | Admitting: Allergy

## 2019-02-22 ENCOUNTER — Other Ambulatory Visit: Payer: Self-pay

## 2019-02-22 ENCOUNTER — Encounter: Payer: Self-pay | Admitting: Allergy

## 2019-02-22 ENCOUNTER — Telehealth: Payer: Self-pay | Admitting: *Deleted

## 2019-02-22 VITALS — BP 114/72 | HR 100 | Resp 18

## 2019-02-22 DIAGNOSIS — Z8709 Personal history of other diseases of the respiratory system: Secondary | ICD-10-CM | POA: Diagnosis not present

## 2019-02-22 DIAGNOSIS — J4550 Severe persistent asthma, uncomplicated: Secondary | ICD-10-CM | POA: Diagnosis not present

## 2019-02-22 DIAGNOSIS — H1010 Acute atopic conjunctivitis, unspecified eye: Secondary | ICD-10-CM | POA: Diagnosis not present

## 2019-02-22 DIAGNOSIS — J302 Other seasonal allergic rhinitis: Secondary | ICD-10-CM

## 2019-02-22 DIAGNOSIS — J3089 Other allergic rhinitis: Secondary | ICD-10-CM

## 2019-02-22 NOTE — Assessment & Plan Note (Addendum)
Past history - History of frequent upper respiratory infections including sinusitis, pneumonia and ear infections.  Interim history - Reviewed bloodwork: normal immunoglobulin levels and good pneumococcal and tetanus/diptheria titers.   Keep track of infections.

## 2019-02-22 NOTE — Progress Notes (Signed)
Follow Up Note  RE: [name] MRN: [id number] DOB: [birth year] Date of Office Visit: 02/22/2019  Referring provider: Sunnie Nielsen, DO Primary care provider: Sunnie Nielsen, DO  Chief Complaint: Asthma  History of Present Illness: I had the pleasure of seeing [name] for a follow up visit at the [facility] on 02/22/2019. She is a 38 y.o. female, who is being followed for asthma, allergic rhinitis, history of frequent upper respiratory infections. Today she is here for regular follow up visit. Her previous allergy office visit was on 01/24/2019 with Dr. Selena Batten.   Asthma:  Currently on Dulera 2 puffs BID, Singulair daily and Spiriva 2 puffs and accidentally used it twice a day. She noticed slight improvement in symptoms and still using albuterol 2-3 times a day. Patient read pamphlets on biologics and very interested in starting therapy.    Other allergic rhinitis  Taking zyrtec daily and started Flonase 2 sprays daily with some benefit.  History of frequent upper respiratory infection  No infection since the last visit.   Assessment and Plan: [name] is a 38 y.o. female with: Not well controlled severe persistent asthma Past history - Patient was diagnosed with asthma over 30 years ago.  She was doing well up until 2012 when she got really ill and was hospitalized.  Recently started on Dulera 200 2 puffs twice a day which has been helping better than the Symbicort.  Still using albuterol rescue inhaler on a daily basis and had multiple courses of prednisone the last year with 2 ER visits and at least 6 urgent care visits as well.  Chest x-ray in December 2019 showed mild peribronchial thickening which may relate to chronic bronchitis or asthma.  Patient was followed by pulmonology in the past. Interim history - slightly better with Spiriva but was taking it BID instead of QD. Still using albuterol daily.  Today's spirometry showed moderate  restriction.  Based on IgE and eosinophil counts patient meets criteria for multiple biologic therapies. However due to work and inability to take frequent time off will start on either [place] or [place] depending on insurance approval. Reviewed risks and benefits.   Daily controller medication(s): Continue Dulera 200 2 puffs twice a day with spacer rinse mouth afterwards. ? Continue Spiriva 1.19mcg 2 puffs daily. ? Continue Singulair 10mg  daily.   Prior to physical activity:May use albuterol rescue inhaler 2 puffs 5 to 15 minutes prior to strenuous physical activities.  Rescue medications:May use albuterol rescue inhaler 2 puffs or nebulizer every 4 to 6 hours as needed for shortness of breath, chest tightness, coughing, and wheezing. Monitor frequency of use.   Seasonal and perennial allergic rhinoconjunctivitis Past history - Perennial rhinoconjunctivitis symptoms with worsening in the fall and winter for 30+ years.  Has tried Zyrtec, Singulair and Flonase as needed with some benefit.  Patient had blood work about 5 years ago which was apparently positive to cats, dogs, tree, grass and mold per patient report.  No recent ENT evaluation. 2020 skin testing was positive to: grass, tree, cat, dog, horse, mouse, ragweed, weed, mold. Interim history -   Continue environmental control measures.  May use over the counter antihistamines such as Zyrtec (cetirizine), Claritin (loratadine), Allegra (fexofenadine), or Xyzal (levocetirizine) daily as needed.  Continue Flonase 1-2 sprays daily.    Will start allergy immunotherapy once asthma is in better control and work schedule allows weekly injections.   History of frequent upper respiratory infection Past history - History  of frequent upper respiratory infections including sinusitis, pneumonia and ear infections.  Interim history - Reviewed bloodwork: normal immunoglobulin levels and good pneumococcal and tetanus/diptheria titers.   Keep track  of infections.  Return in about 3 months (around 05/25/2019).  Diagnostics: Spirometry:  Tracings reviewed. Her effort: Good reproducible efforts. FVC: 2.37L FEV1: 1.95L, 59% predicted FEV1/FVC ratio: 82% Interpretation: Spirometry consistent with moderate restrictive disease.  Please see scanned spirometry results for details.  Medication List:  Current Outpatient Medications  Medication Sig Dispense Refill  . Albuterol Sulfate (PROAIR RESPICLICK) 108 (90 Base) MCG/ACT AEPB Inhale 2 puffs into the lungs every 6 (six) hours as needed. 1 each 11  . cetirizine (ZYRTEC) 10 MG tablet Take 10 mg by mouth at bedtime.    . famotidine (PEPCID) 20 MG tablet TAKE 1 TABLET (20 MG TOTAL) BY MOUTH AT BEDTIME. 30 tablet 3  . fluticasone (FLONASE) 50 MCG/ACT nasal spray Use 1-2 sprays per nostril daily 16 g 2  . levalbuterol (XOPENEX) 1.25 MG/3ML nebulizer solution Take 1.25 mg by nebulization every 4 (four) hours as needed for wheezing. 72 mL 0  . mometasone-formoterol (DULERA) 200-5 MCG/ACT AERO Inhale 2 puffs into the lungs 2 (two) times daily. 2 Inhaler 3  . montelukast (SINGULAIR) 10 MG tablet Take 1 tablet (10 mg total) by mouth at bedtime. 30 tablet 11  . Multiple Vitamin (MULTIVITAMIN WITH MINERALS) TABS tablet Take 1 tablet by mouth daily. Women's One A Day    . omeprazole (PRILOSEC) 40 MG capsule Take 1 capsule (40 mg total) by mouth daily. 90 capsule 0  . Tiotropium Bromide Monohydrate (SPIRIVA RESPIMAT) 1.25 MCG/ACT AERS Inhale 2 puffs into the lungs daily. 4 g 2   No current facility-administered medications for this visit.    Allergies: Allergies  Allergen Reactions  . Aspirin Hives  . Levaquin [Levofloxacin In D5w] Hives  . Pamprin [Apap-Pamabrom-Pyrilamine] Hives   I reviewed her past medical history, social history, family history, and environmental history and no significant changes have been reported from previous visit on 01/24/2019.  Review of Systems  Constitutional:  Negative for appetite change, chills, fever and unexpected weight change.  HENT: Positive for congestion and sneezing. Negative for rhinorrhea.   Eyes: Negative for itching.  Respiratory: Positive for cough, chest tightness, shortness of breath and wheezing.   Cardiovascular: Negative for chest pain.  Gastrointestinal: Negative for abdominal pain.  Genitourinary: Negative for difficulty urinating.  Skin: Negative for rash.  Allergic/Immunologic: Positive for environmental allergies. Negative for food allergies.  Neurological: Negative for headaches.   Objective: BP 114/72 (BP Location: Left Arm, Patient Position: Sitting, Cuff Size: Large)   Pulse 100   Resp 18   SpO2 97%  There is no height or weight on file to calculate BMI. Physical Exam  Constitutional: She is oriented to person, place, and time. She appears well-developed and well-nourished.  HENT:  Head: Normocephalic and atraumatic.  Right Ear: External ear normal.  Left Ear: External ear normal.  Nose: Nose normal.  Mouth/Throat: Oropharynx is clear and moist.  Eyes: Conjunctivae and EOM are normal.  Neck: Neck supple.  Cardiovascular: Normal rate, regular rhythm and normal heart sounds. Exam reveals no gallop and no friction rub.  No murmur heard. Pulmonary/Chest: Effort normal and breath sounds normal. She has no wheezes. She has no rales.  Abdominal: Soft.  Lymphadenopathy:    She has no cervical adenopathy.  Neurological: She is alert and oriented to person, place, and time.  Skin: Skin is warm. No  rash noted.  Psychiatric: She has a normal mood and affect. Her behavior is normal.  Nursing note and vitals reviewed.  Previous notes and tests were reviewed. The plan was reviewed with the patient/family, and all questions/concerned were addressed.  It was my pleasure to see [name] today and participate in her care. Please feel free to contact me with any questions or concerns.  Sincerely,  Wyline Mood, DO Allergy &  Immunology  [facility] office: [phone] [facility] office: [phone]

## 2019-02-22 NOTE — Assessment & Plan Note (Signed)
>>  ASSESSMENT AND PLAN FOR HISTORY OF FREQUENT UPPER RESPIRATORY INFECTION WRITTEN ON 02/22/2019 10:53 PM BY Ellamae Sia, DO  Past history - History of frequent upper respiratory infections including sinusitis, pneumonia and ear infections.  Interim history - Reviewed bloodwork: normal immunoglobulin levels and good pneumococcal and tetanus/diptheria titers.   Keep track of infections.

## 2019-02-22 NOTE — Telephone Encounter (Signed)
Per Dr Selena Batten can you submit for Harrington Challenger or Virginia Crews and call patient

## 2019-02-22 NOTE — Assessment & Plan Note (Addendum)
Past history - Perennial rhinoconjunctivitis symptoms with worsening in the fall and winter for 30+ years.  Has tried Zyrtec, Singulair and Flonase as needed with some benefit.  Patient had blood work about 5 years ago which was apparently positive to cats, dogs, tree, grass and mold per patient report.  No recent ENT evaluation. 2020 skin testing was positive to: grass, tree, cat, dog, horse, mouse, ragweed, weed, mold. Interim history -   Continue environmental control measures.  May use over the counter antihistamines such as Zyrtec (cetirizine), Claritin (loratadine), Allegra (fexofenadine), or Xyzal (levocetirizine) daily as needed.  Continue Flonase 1-2 sprays daily.    Will start allergy immunotherapy once asthma is in better control and work schedule allows weekly injections.

## 2019-02-22 NOTE — Patient Instructions (Addendum)
Not well controlled severe persistent asthma  2020 skin prick testing was also positive to grass, tree, cat, dog, horse, mouse, ragweed, weed, mold.  She does have 2 cats at home.  Will start on biologic injections for asthma.   Daily controller medication(s): Continue Dulera 200 2 puffs twice a day with spacer rinse mouth afterwards. ? Start Spiriva 1.31mcg 2 puffs daily. ? Continue Singulair 10mg  daily.   Prior to physical activity:May use albuterol rescue inhaler 2 puffs 5 to 15 minutes prior to strenuous physical activities.  Rescue medications:May use albuterol rescue inhaler 2 puffs or nebulizer every 4 to 6 hours as needed for shortness of breath, chest tightness, coughing, and wheezing. Monitor frequency of use.   Other allergic rhinitis  Continue environmental control measures.  May use over the counter antihistamines such as Zyrtec (cetirizine), Claritin (loratadine), Allegra (fexofenadine), or Xyzal (levocetirizine) daily as needed.  Continue Flonase 1-2 sprays daily.    Will start allergy immunotherapy once asthma is in better control.  History of frequent upper respiratory infection  Keep track of infections.  Follow up in 3 months

## 2019-02-22 NOTE — Assessment & Plan Note (Signed)
Past history - Patient was diagnosed with asthma over 30 years ago.  She was doing well up until 2012 when she got really ill and was hospitalized.  Recently started on Dulera 200 2 puffs twice a day which has been helping better than the Symbicort.  Still using albuterol rescue inhaler on a daily basis and had multiple courses of prednisone the last year with 2 ER visits and at least 6 urgent care visits as well.  Chest x-ray in December 2019 showed mild peribronchial thickening which may relate to chronic bronchitis or asthma.  Patient was followed by pulmonology in the past. Interim history - slightly better with Spiriva but was taking it BID instead of QD. Still using albuterol daily.  Today's spirometry showed moderate restriction.  Based on IgE and eosinophil counts patient meets criteria for multiple biologic therapies. However due to work and inability to take frequent time off will start on either [place] or [place] depending on insurance approval. Reviewed risks and benefits.   Daily controller medication(s): Continue Dulera 200 2 puffs twice a day with spacer rinse mouth afterwards. ? Continue Spiriva 1.53mcg 2 puffs daily. ? Continue Singulair 10mg  daily.   Prior to physical activity:May use albuterol rescue inhaler 2 puffs 5 to 15 minutes prior to strenuous physical activities.  Rescue medications:May use albuterol rescue inhaler 2 puffs or nebulizer every 4 to 6 hours as needed for shortness of breath, chest tightness, coughing, and wheezing. Monitor frequency of use.

## 2019-02-23 NOTE — Telephone Encounter (Signed)
Got it will submit and reach out to patient

## 2019-02-26 NOTE — Telephone Encounter (Signed)
Noted  

## 2019-02-28 ENCOUNTER — Telehealth: Payer: Self-pay | Admitting: Osteopathic Medicine

## 2019-02-28 ENCOUNTER — Ambulatory Visit: Payer: Self-pay | Admitting: Osteopathic Medicine

## 2019-02-28 NOTE — Telephone Encounter (Signed)
Called patient and left voicemail with information below. Asked patient to call us once ready to reschedule missed appointment.

## 2019-02-28 NOTE — Telephone Encounter (Signed)
Patient called at 2:50pm stating that she can not make appointment due to work. Will call us back tomorrow to reschedule.

## 2019-02-28 NOTE — Telephone Encounter (Signed)
Please call patient: she has canclled with <24 hours notice twice now, which this clinic considers "no-show"   Please remind her of clinic policy that 3 no-shows in 1 year will result in dismissal from the practice, to protect access to appointments for all our patients.

## 2019-04-12 ENCOUNTER — Telehealth: Payer: Self-pay | Admitting: Allergy

## 2019-04-12 MED ORDER — TIOTROPIUM BROMIDE MONOHYDRATE 1.25 MCG/ACT IN AERS: 2.0000 | INHALATION_SPRAY | Freq: Every day | RESPIRATORY_TRACT | 2 refills | Status: DC

## 2019-04-12 NOTE — Telephone Encounter (Signed)
Prescription has been sent as requested and to the requested pharmacy.

## 2019-04-12 NOTE — Telephone Encounter (Signed)
PA initiated through [email or web address] for Spiriva. Pending review.

## 2019-04-12 NOTE — Telephone Encounter (Signed)
Patient is requesting a 90 day refill for Spiriva. She said her insurance will not pay if it isn't 90 day. She also needs to change pharmacy for this to [facility] on [address]

## 2019-04-13 NOTE — Telephone Encounter (Signed)
Form completed and placed in Dr. Elmyra Ricks office to be signed on Monday.

## 2019-04-13 NOTE — Telephone Encounter (Signed)
Covermymeds called to advise that Rosann Auerbach was unable to everify the eligibility of this patient. She is faxing a form over for [place] to complete and fax to [facility] directly.

## 2019-04-16 ENCOUNTER — Telehealth: Payer: Self-pay

## 2019-04-16 MED ORDER — TIOTROPIUM BROMIDE MONOHYDRATE 1.25 MCG/ACT IN AERS: 2.0000 | INHALATION_SPRAY | Freq: Every day | RESPIRATORY_TRACT | 2 refills | Status: DC

## 2019-04-16 NOTE — Telephone Encounter (Signed)
Received fax back from [place] stating that pharmacy benefits are with another pharmacy [employer] systems [employer].  Per patients insurance card, pharmacy benefits through [employer].  Going to submit Spiriva to [facility] to and wait for approval or denial. Patient is aware of this.  She did state that her insurance was able to provide a three month supply for 35 dollars through and [employer].  Patient is aware the [facility] may be in touch with her.

## 2019-04-16 NOTE — Telephone Encounter (Signed)
Formed faxed to [facility] and placed in bulk scanning.

## 2019-04-17 ENCOUNTER — Ambulatory Visit (INDEPENDENT_AMBULATORY_CARE_PROVIDER_SITE_OTHER): Payer: Managed Care, Other (non HMO) | Admitting: *Deleted

## 2019-04-17 ENCOUNTER — Other Ambulatory Visit: Payer: Self-pay

## 2019-04-17 DIAGNOSIS — J4550 Severe persistent asthma, uncomplicated: Secondary | ICD-10-CM

## 2019-04-17 MED ORDER — EPINEPHRINE 0.3 MG/0.3ML IJ SOAJ: 0.3000 mg | Freq: Once | INTRAMUSCULAR | 2 refills | Status: AC

## 2019-04-17 MED ORDER — BENRALIZUMAB 30 MG/ML ~~LOC~~ SOSY
30.0000 mg | PREFILLED_SYRINGE | Freq: Once | SUBCUTANEOUS | Status: AC
  Administered 2019-04-17: 09:00:00 30 mg via SUBCUTANEOUS

## 2019-04-17 NOTE — Progress Notes (Signed)
Immunotherapy   Patient Details  Name: [name] MRN: [id number] Date of Birth: [birth year]  04/17/2019  Leone Brand started injections for  [facility]  Frequency: Every 28 days X3 doses, then every 8 weeks.  Epi-Pen: Yes Consent signed and patient instructions given. Patient received 79mL of Fasenra today in the [facility]. Patient waited 30 minutes and did not experience any issues.    [name] 04/17/2019, 9:19 AM

## 2019-04-20 ENCOUNTER — Other Ambulatory Visit: Payer: Self-pay | Admitting: Allergy

## 2019-05-15 ENCOUNTER — Ambulatory Visit: Payer: Self-pay

## 2019-05-16 ENCOUNTER — Other Ambulatory Visit: Payer: Self-pay | Admitting: Allergy

## 2019-05-17 NOTE — Telephone Encounter (Signed)
Please advise 

## 2019-05-21 ENCOUNTER — Other Ambulatory Visit: Payer: Self-pay

## 2019-05-21 MED ORDER — EPINEPHRINE 0.3 MG/0.3ML IJ SOAJ: 0.3000 mg | Freq: Once | INTRAMUSCULAR | 2 refills | Status: AC

## 2019-05-22 ENCOUNTER — Other Ambulatory Visit: Payer: Self-pay | Admitting: Internal Medicine

## 2019-05-24 ENCOUNTER — Ambulatory Visit (INDEPENDENT_AMBULATORY_CARE_PROVIDER_SITE_OTHER): Payer: Managed Care, Other (non HMO)

## 2019-05-24 ENCOUNTER — Other Ambulatory Visit: Payer: Self-pay

## 2019-05-24 DIAGNOSIS — J4550 Severe persistent asthma, uncomplicated: Secondary | ICD-10-CM

## 2019-05-24 MED ORDER — BENRALIZUMAB 30 MG/ML ~~LOC~~ SOSY
30.0000 mg | PREFILLED_SYRINGE | Freq: Once | SUBCUTANEOUS | Status: AC
  Administered 2019-05-24: 30 mg via SUBCUTANEOUS

## 2019-05-30 ENCOUNTER — Ambulatory Visit: Payer: Managed Care, Other (non HMO) | Admitting: Allergy

## 2019-06-21 ENCOUNTER — Ambulatory Visit: Payer: Self-pay

## 2019-07-03 ENCOUNTER — Ambulatory Visit (INDEPENDENT_AMBULATORY_CARE_PROVIDER_SITE_OTHER): Payer: Managed Care, Other (non HMO) | Admitting: *Deleted

## 2019-07-03 ENCOUNTER — Other Ambulatory Visit: Payer: Self-pay

## 2019-07-03 DIAGNOSIS — J4550 Severe persistent asthma, uncomplicated: Secondary | ICD-10-CM | POA: Diagnosis not present

## 2019-07-03 MED ORDER — BENRALIZUMAB 30 MG/ML ~~LOC~~ SOSY
30.0000 mg | PREFILLED_SYRINGE | Freq: Once | SUBCUTANEOUS | Status: AC
  Administered 2019-07-03: 30 mg via SUBCUTANEOUS

## 2019-07-11 ENCOUNTER — Ambulatory Visit: Payer: Managed Care, Other (non HMO) | Admitting: Allergy

## 2019-07-11 NOTE — Progress Notes (Deleted)
Follow Up Note  RE: [name] MRN: [id number] DOB: [birth year] Date of Office Visit: 07/11/2019  Referring provider: Sunnie NielsenAlexander, Natalie, DO Primary care provider: Sunnie NielsenAlexander, Natalie, DO  Chief Complaint: No chief complaint on file.  History of Present Illness: I had the pleasure of seeing [name] for a follow up visit at the [facility] on 07/11/2019. She is a 38 y.o. female, who is being followed for asthma, allergic rhinoconjunctivitis, history of frequent upper respiratory infection. Today she is here for regular follow up visit. Her previous allergy office visit was on 02/22/2019 with Dr. Selena BattenKim.   Not well controlled severe persistent asthma Past history - Patient was diagnosed with asthma over 30 years ago.  She was doing well up until 2012 when she got really ill and was hospitalized.  Recently started on Dulera 200 2 puffs twice a day which has been helping better than the Symbicort.  Still using albuterol rescue inhaler on a daily basis and had multiple courses of prednisone the last year with 2 ER visits and at least 6 urgent care visits as well.  Chest x-ray in December 2019 showed mild peribronchial thickening which may relate to chronic bronchitis or asthma.  Patient was followed by pulmonology in the past. Interim history - slightly better with Spiriva but was taking it BID instead of QD. Still using albuterol daily.  Today's spirometry showed moderate restriction.  Based on IgE and eosinophil counts patient meets criteria for multiple biologic therapies. However due to work and inability to take frequent time off will start on either [place] or [place] depending on insurance approval. Reviewed risks and benefits.   Daily controller medication(s):Continue Dulera 200 2 puffs twice a day with spacer rinse mouth afterwards. ? Continue Spiriva1.4025mcg2 puffs daily. ? Continue Singulair 10mg  daily.   Prior to physical activity:May use albuterol rescue  inhaler 2 puffs 5 to 15 minutes prior to strenuous physical activities.  Rescue medications:May use albuterol rescue inhaler 2 puffs or nebulizer every 4 to 6 hours as needed for shortness of breath, chest tightness, coughing, and wheezing. Monitor frequency of use.  Seasonal and perennial allergic rhinoconjunctivitis Past history - Perennial rhinoconjunctivitis symptoms with worsening in the fall and winter for 30+ years.  Has tried Zyrtec, Singulair and Flonase as needed with some benefit.  Patient had blood work about 5 years ago which was apparently positive to cats, dogs, tree, grass and mold per patient report.  No recent ENT evaluation. 2020 skin testing was positive to: grass, tree, cat, dog, horse, mouse, ragweed, weed, mold. Interim history -   Continue environmental control measures.  May use over the counter antihistamines such as Zyrtec (cetirizine), Claritin (loratadine), Allegra (fexofenadine), or Xyzal (levocetirizine) daily as needed.  Continue Flonase 1-2 sprays daily.  Will start allergy immunotherapy once asthma is in better control and work schedule allows weekly injections.   History of frequent upper respiratory infection Past history - History of frequent upper respiratory infections including sinusitis, pneumonia and ear infections.  Interim history - Reviewed bloodwork: normal immunoglobulin levels and good pneumococcal and tetanus/diptheria titers.   Keep track of infections.  Return in about 3 months (around 05/25/2019).  Assessment and Plan: [name] is a 38 y.o. female with: No problem-specific Assessment & Plan notes found for this encounter.  No follow-ups on file.  No orders of the defined types were placed in this encounter.  Lab Orders  No laboratory test(s) ordered today    Diagnostics: Spirometry:  Tracings reviewed. Her effort: {Blank single:19197::"Good reproducible efforts.","It was hard to get consistent efforts and there is a question  as to whether this reflects a maximal maneuver.","Poor effort, data can not be interpreted."} FVC: ***L FEV1: ***L, ***% predicted FEV1/FVC ratio: ***% Interpretation: {Blank single:19197::"Spirometry consistent with mild obstructive disease","Spirometry consistent with moderate obstructive disease","Spirometry consistent with severe obstructive disease","Spirometry consistent with possible restrictive disease","Spirometry consistent with mixed obstructive and restrictive disease","Spirometry uninterpretable due to technique","Spirometry consistent with normal pattern","No overt abnormalities noted given today's efforts"}.  Please see scanned spirometry results for details.  Skin Testing: {Blank single:19197::"Select foods","Environmental allergy panel","Environmental allergy panel and select foods","Food allergy panel","None","Deferred due to recent antihistamines use"}. Positive test to: ***. Negative test to: ***.  Results discussed with patient/family.   Medication List:  Current Outpatient Medications  Medication Sig Dispense Refill  . Albuterol Sulfate (PROAIR RESPICLICK) 108 (90 Base) MCG/ACT AEPB Inhale 2 puffs into the lungs every 6 (six) hours as needed. 1 each 11  . cetirizine (ZYRTEC) 10 MG tablet Take 10 mg by mouth at bedtime.    . famotidine (PEPCID) 20 MG tablet TAKE 1 TABLET (20 MG TOTAL) BY MOUTH AT BEDTIME. 30 tablet 3  . FASENRA 30 MG/ML SOSY INJECT 30 MG UNDER THE SKIN AT WEEKS 0, 4, AND 8 FOLLOWED BY EVERY 8 WEEKS THEREAFTER 1 mL 12  . fluticasone (FLONASE) 50 MCG/ACT nasal spray Use 1-2 sprays per nostril daily 16 g 2  . levalbuterol (XOPENEX) 1.25 MG/3ML nebulizer solution Take 1.25 mg by nebulization every 4 (four) hours as needed for wheezing. 72 mL 0  . mometasone-formoterol (DULERA) 200-5 MCG/ACT AERO Inhale 2 puffs into the lungs 2 (two) times daily. 2 Inhaler 3  . montelukast (SINGULAIR) 10 MG tablet TAKE 1 TABLET (10 MG TOTAL) BY MOUTH AT BEDTIME. 30 tablet 0  .  Multiple Vitamin (MULTIVITAMIN WITH MINERALS) TABS tablet Take 1 tablet by mouth daily. Women's One A Day    . omeprazole (PRILOSEC) 40 MG capsule Take 1 capsule (40 mg total) by mouth daily. 90 capsule 0  . SPIRIVA RESPIMAT 1.25 MCG/ACT AERS INHALE 2 PUFFS INTO THE LUNGS DAILY 12 g 4  . SYMBICORT 160-4.5 MCG/ACT inhaler INHALE 2 PUFFS BY MOUTH INTO THE LUNGS 2 (TWO) TIMES DAILY. 10.2 g 0   No current facility-administered medications for this visit.    Allergies: Allergies  Allergen Reactions  . Aspirin Hives  . Levaquin [Levofloxacin In D5w] Hives  . Pamprin [Apap-Pamabrom-Pyrilamine] Hives   I reviewed her past medical history, social history, family history, and environmental history and no significant changes have been reported from previous visit on 02/22/2019.  Review of Systems  Constitutional: Negative for appetite change, chills, fever and unexpected weight change.  HENT: Positive for congestion and sneezing. Negative for rhinorrhea.   Eyes: Negative for itching.  Respiratory: Positive for cough, chest tightness, shortness of breath and wheezing.   Cardiovascular: Negative for chest pain.  Gastrointestinal: Negative for abdominal pain.  Genitourinary: Negative for difficulty urinating.  Skin: Negative for rash.  Allergic/Immunologic: Positive for environmental allergies. Negative for food allergies.  Neurological: Negative for headaches.   Objective: There were no vitals taken for this visit. There is no height or weight on file to calculate BMI. Physical Exam  Constitutional: She is oriented to person, place, and time. She appears well-developed and well-nourished.  HENT:  Head: Normocephalic and atraumatic.  Right Ear: External ear normal.  Left Ear: External ear normal.  Nose: Nose normal.  Mouth/Throat: Oropharynx is clear  and moist.  Eyes: Conjunctivae and EOM are normal.  Neck: Neck supple.  Cardiovascular: Normal rate, regular rhythm and normal heart sounds.  Exam reveals no gallop and no friction rub.  No murmur heard. Pulmonary/Chest: Effort normal and breath sounds normal. She has no wheezes. She has no rales.  Abdominal: Soft.  Neurological: She is alert and oriented to person, place, and time.  Skin: Skin is warm. No rash noted.  Psychiatric: She has a normal mood and affect. Her behavior is normal.  Nursing note and vitals reviewed.  Previous notes and tests were reviewed. The plan was reviewed with the patient/family, and all questions/concerned were addressed.  It was my pleasure to see Tanysha today and participate in her care. Please feel free to contact me with any questions or concerns.  Sincerely,  Rexene Alberts, DO Allergy & Immunology  [facility] office: [phone] [facility] office: [place] office: [phone]

## 2019-07-12 ENCOUNTER — Other Ambulatory Visit: Payer: Self-pay

## 2019-07-12 ENCOUNTER — Ambulatory Visit (INDEPENDENT_AMBULATORY_CARE_PROVIDER_SITE_OTHER): Payer: Managed Care, Other (non HMO) | Admitting: Osteopathic Medicine

## 2019-07-12 ENCOUNTER — Encounter: Payer: Self-pay | Admitting: Osteopathic Medicine

## 2019-07-12 VITALS — Temp 97.9°F | Wt 289.0 lb

## 2019-07-12 DIAGNOSIS — L819 Disorder of pigmentation, unspecified: Secondary | ICD-10-CM

## 2019-07-12 NOTE — Progress Notes (Signed)
Virtual Visit via Video (App used: Doximity) Note  I connected with      [name] on 07/12/19 at 3:00 PM by a telemedicine application and verified that I am speaking with the correct person using two identifiers.  Patient is at home I am in office   I discussed the limitations of evaluation and management by telemedicine and the availability of in person appointments. The patient expressed understanding and agreed to proceed.  History of Present Illness: [name] is a 38 y.o. female who would like to discuss yellowed skin   Patient reports patches of fairly bright yellow on the skin of her arms worse on the left hand, also below corners of the mouth, just noticed starting this morning. No new foods, lotions, soaps. Just started taking turmeric supplements this morning. Doesn't htink that she would have gotten into any kind of highlighter or ink/pain. otherwise feels totally normal.      Observations/Objective: Temp 97.9 F (36.6 C) (Oral)   Wt 289 lb (131.1 kg)   BMI 46.65 kg/m  BP Readings from Last 3 Encounters:  02/22/19 114/72  01/24/19 128/78  01/08/19 (!) 109/58   Exam: Normal Speech.  Relatively bright yellow patches on the palm of L hand, not continuous, yellow on the edges of nails, also fairly bright. Yellow on chin below corners of the mouth. Video resolution isn't great but definitely doesn't look orange or brown, no scleral icterus  Lab and Radiology Results No results found for this or any previous visit (from the past 72 hour(s)). No results found.     Assessment and Plan: 38 y.o. female with The encounter diagnosis was Discolored skin.  Seem sunlikely Addisons/adrenal issue, seems really improbable that turmeric supplement would do this, it really looks like she got some kind of ink on the skin but she's certain this can't be the case. Labs as below, if worse/change would definitely send to dermatology.    PDMP not reviewed this  encounter. Orders Placed This Encounter  Procedures  . CBC  . COMPLETE METABOLIC PANEL WITH GFR  . Hemoglobin A1c  . TSH  . Carotene, Serum  . Vitamin A  . Lipid Panel w/reflex Direct LDL   No orders of the defined types were placed in this encounter.     Follow Up Instructions: Return for RECHECK PENDING RESULTS / IF WORSE OR CHANGE.    I discussed the assessment and treatment plan with the patient. The patient was provided an opportunity to ask questions and all were answered. The patient agreed with the plan and demonstrated an understanding of the instructions.   The patient was advised to call back or seek an in-person evaluation if any new concerns, if symptoms worsen or if the condition fails to improve as anticipated.  25 minutes of non-face-to-face time was provided during this encounter.                      Historical information moved to improve visibility of documentation.  Past Medical History:  Diagnosis Date  . Asthma   . GERD (gastroesophageal reflux disease)   . History of chicken pox   . Pneumonia 2017  . UTI (urinary tract infection)    Past Surgical History:  Procedure Laterality Date  . CESAREAN SECTION  09/27/12  . NO PAST SURGERIES    . ORIF ELBOW FRACTURE Left 12/16/2018   Procedure: Left elbow radial head resection and arthroplasty with ligament repair as  necessary;  Surgeon: Dominica SeverinGramig, William, MD;  [facility];  Service: Orthopedics;  Laterality: Left;  2 hrs   Social History   Tobacco Use  . Smoking status: Former Smoker    Quit date: 2004    Years since quitting: 16.5  . Smokeless tobacco: Never Used  . Tobacco comment: smoked 1-2 months then quit  Substance Use Topics  . Alcohol use: No   family history includes Allergies in her sister; Asthma in her maternal grandmother, maternal uncle, and mother; Cancer in her sister; Colon cancer in her maternal grandfather; Diabetes in her father and paternal grandmother;  Fibroids in her mother; Liver cancer in her paternal aunt.  Medications: Current Outpatient Medications  Medication Sig Dispense Refill  . Albuterol Sulfate (PROAIR RESPICLICK) 108 (90 Base) MCG/ACT AEPB Inhale 2 puffs into the lungs every 6 (six) hours as needed. 1 each 11  . cetirizine (ZYRTEC) 10 MG tablet Take 10 mg by mouth at bedtime.    Marland Kitchen. FASENRA 30 MG/ML SOSY INJECT 30 MG UNDER THE SKIN AT WEEKS 0, 4, AND 8 FOLLOWED BY EVERY 8 WEEKS THEREAFTER 1 mL 12  . fluticasone (FLONASE) 50 MCG/ACT nasal spray Use 1-2 sprays per nostril daily 16 g 2  . levalbuterol (XOPENEX) 1.25 MG/3ML nebulizer solution Take 1.25 mg by nebulization every 4 (four) hours as needed for wheezing. 72 mL 0  . mometasone-formoterol (DULERA) 200-5 MCG/ACT AERO Inhale 2 puffs into the lungs 2 (two) times daily. 2 Inhaler 3  . montelukast (SINGULAIR) 10 MG tablet TAKE 1 TABLET (10 MG TOTAL) BY MOUTH AT BEDTIME. 30 tablet 0  . Multiple Vitamin (MULTIVITAMIN WITH MINERALS) TABS tablet Take 1 tablet by mouth daily. Women's One A Day    . omeprazole (PRILOSEC) 40 MG capsule Take 1 capsule (40 mg total) by mouth daily. 90 capsule 0  . SPIRIVA RESPIMAT 1.25 MCG/ACT AERS INHALE 2 PUFFS INTO THE LUNGS DAILY 12 g 4  . famotidine (PEPCID) 20 MG tablet TAKE 1 TABLET (20 MG TOTAL) BY MOUTH AT BEDTIME. (Patient not taking: Reported on 07/12/2019) 30 tablet 3  . SYMBICORT 160-4.5 MCG/ACT inhaler INHALE 2 PUFFS BY MOUTH INTO THE LUNGS 2 (TWO) TIMES DAILY. (Patient not taking: Reported on 07/12/2019) 10.2 g 0   No current facility-administered medications for this visit.    Allergies  Allergen Reactions  . Aspirin Hives  . Levaquin [Levofloxacin In D5w] Hives  . Pamprin [Apap-Pamabrom-Pyrilamine] Hives    PDMP not reviewed this encounter. Orders Placed This Encounter  Procedures  . CBC  . COMPLETE METABOLIC PANEL WITH GFR  . Hemoglobin A1c  . TSH  . Carotene, Serum  . Vitamin A  . Lipid Panel w/reflex Direct LDL   No orders  of the defined types were placed in this encounter.

## 2019-07-13 ENCOUNTER — Telehealth: Payer: Self-pay | Admitting: *Deleted

## 2019-07-13 NOTE — Telephone Encounter (Signed)
Appointment has been changed to 08/03/2019. Called and left a detailed voicemail advising patient of change in appointment.

## 2019-07-13 NOTE — Telephone Encounter (Signed)
-----   Message from [place] sent at 07/11/2019  1:01 PM EDT ----- Patient should be getting next Fasenra in 8 weeks from last inj. Please r/s patient her next should be 8/14 Thanks,Tammy

## 2019-07-17 LAB — COMPLETE METABOLIC PANEL WITH GFR
AG Ratio: 1.5 (calc) (ref 1.0–2.5)
ALT: 15 U/L (ref 6–29)
AST: 14 U/L (ref 10–30)
Albumin: 4 g/dL (ref 3.6–5.1)
Alkaline phosphatase (APISO): 74 U/L (ref 31–125)
BUN: 15 mg/dL (ref 7–25)
CO2: 28 mmol/L (ref 20–32)
Calcium: 9.3 mg/dL (ref 8.6–10.2)
Chloride: 103 mmol/L (ref 98–110)
Creat: 0.74 mg/dL (ref 0.50–1.10)
GFR, Est African American: 121 mL/min/{1.73_m2} (ref >=60)
GFR, Est Non African American: 104 mL/min/{1.73_m2} (ref >=60)
Globulin: 2.6 g/dL (calc) (ref 1.9–3.7)
Glucose, Bld: 95 mg/dL (ref 65–99)
Potassium: 4.2 mmol/L (ref 3.5–5.3)
Sodium: 139 mmol/L (ref 135–146)
Total Bilirubin: 0.2 mg/dL (ref 0.2–1.2)
Total Protein: 6.6 g/dL (ref 6.1–8.1)

## 2019-07-17 LAB — LIPID PANEL W/REFLEX DIRECT LDL
Cholesterol: 207 mg/dL — ABNORMAL HIGH (ref <200)
HDL: 41 mg/dL — ABNORMAL LOW (ref >=50)
LDL Cholesterol (Calc): 132 mg/dL (calc) — ABNORMAL HIGH
Non-HDL Cholesterol (Calc): 166 mg/dL (calc) — ABNORMAL HIGH (ref <130)
Total CHOL/HDL Ratio: 5 (calc) — ABNORMAL HIGH (ref <5.0)
Triglycerides: 198 mg/dL — ABNORMAL HIGH (ref <150)

## 2019-07-17 LAB — CBC
HCT: 38.3 % (ref 35.0–45.0)
Hemoglobin: 13.1 g/dL (ref 11.7–15.5)
MCH: 30.3 pg (ref 27.0–33.0)
MCHC: 34.2 g/dL (ref 32.0–36.0)
MCV: 88.7 fL (ref 80.0–100.0)
MPV: 10.1 fL (ref 7.5–12.5)
Platelets: 384 10*3/uL (ref 140–400)
RBC: 4.32 10*6/uL (ref 3.80–5.10)
RDW: 13.7 % (ref 11.0–15.0)
WBC: 9.5 10*3/uL (ref 3.8–10.8)

## 2019-07-17 LAB — VITAMIN A: Vitamin A (Retinoic Acid): 47 ug/dL (ref 38–98)

## 2019-07-17 LAB — HEMOGLOBIN A1C
Hgb A1c MFr Bld: 5.8 % of total Hgb — ABNORMAL HIGH (ref <5.7)
Mean Plasma Glucose: 120 (calc)
eAG (mmol/L): 6.6 (calc)

## 2019-07-17 LAB — TSH: TSH: 1.47 mIU/L

## 2019-07-17 LAB — CAROTENE, SERUM: Carotene, Total-Serum: 5 ug/dL — ABNORMAL LOW (ref 6–77)

## 2019-07-31 ENCOUNTER — Ambulatory Visit: Payer: Self-pay

## 2019-08-06 ENCOUNTER — Other Ambulatory Visit: Payer: Self-pay | Admitting: Allergy

## 2019-08-06 ENCOUNTER — Other Ambulatory Visit: Payer: Self-pay | Admitting: Internal Medicine

## 2019-08-22 ENCOUNTER — Encounter: Payer: Self-pay | Admitting: Allergy

## 2019-08-22 ENCOUNTER — Ambulatory Visit (INDEPENDENT_AMBULATORY_CARE_PROVIDER_SITE_OTHER): Payer: Managed Care, Other (non HMO) | Admitting: Allergy

## 2019-08-22 ENCOUNTER — Other Ambulatory Visit: Payer: Self-pay

## 2019-08-22 VITALS — BP 120/82 | HR 107 | Temp 98.1°F | Resp 20

## 2019-08-22 DIAGNOSIS — Z8709 Personal history of other diseases of the respiratory system: Secondary | ICD-10-CM | POA: Diagnosis not present

## 2019-08-22 DIAGNOSIS — H1010 Acute atopic conjunctivitis, unspecified eye: Secondary | ICD-10-CM

## 2019-08-22 DIAGNOSIS — K219 Gastro-esophageal reflux disease without esophagitis: Secondary | ICD-10-CM | POA: Diagnosis not present

## 2019-08-22 DIAGNOSIS — J3089 Other allergic rhinitis: Secondary | ICD-10-CM

## 2019-08-22 DIAGNOSIS — J4550 Severe persistent asthma, uncomplicated: Secondary | ICD-10-CM | POA: Diagnosis not present

## 2019-08-22 DIAGNOSIS — J302 Other seasonal allergic rhinitis: Secondary | ICD-10-CM | POA: Diagnosis not present

## 2019-08-22 MED ORDER — MOMETASONE FURO-FORMOTEROL FUM 200-5 MCG/ACT IN AERO: 2.0000 | INHALATION_SPRAY | Freq: Two times a day (BID) | RESPIRATORY_TRACT | 2 refills | Status: DC

## 2019-08-22 MED ORDER — FAMOTIDINE 20 MG PO TABS: 20.0000 mg | ORAL_TABLET | Freq: Every day | ORAL | 3 refills | Status: DC

## 2019-08-22 MED ORDER — SPIRIVA RESPIMAT 1.25 MCG/ACT IN AERS: 2.0000 | INHALATION_SPRAY | Freq: Every day | RESPIRATORY_TRACT | 2 refills | Status: DC

## 2019-08-22 MED ORDER — BUDESONIDE 0.5 MG/2ML IN SUSP: 0.5000 mg | Freq: Every day | RESPIRATORY_TRACT | 0 refills | Status: DC

## 2019-08-22 MED ORDER — FLUTICASONE PROPIONATE 50 MCG/ACT NA SUSP: NASAL | 2 refills | Status: DC

## 2019-08-22 MED ORDER — PROAIR RESPICLICK 108 (90 BASE) MCG/ACT IN AEPB: 2.0000 | INHALATION_SPRAY | RESPIRATORY_TRACT | 5 refills | Status: DC | PRN

## 2019-08-22 MED ORDER — MONTELUKAST SODIUM 10 MG PO TABS: 10.0000 mg | ORAL_TABLET | Freq: Every day | ORAL | 5 refills | Status: DC

## 2019-08-22 NOTE — Assessment & Plan Note (Addendum)
Past history - History of frequent upper respiratory infections including sinusitis, pneumonia and ear infections. 2020  normal immunoglobulin levels and good pneumococcal and tetanus/diptheria titers.  Interim history - No infections since last OV.  Keep track of infections.  Get flu vaccine in the fall.

## 2019-08-22 NOTE — Assessment & Plan Note (Addendum)
Past history - Patient was diagnosed with asthma over 30 years ago.  She was doing well up until 2012 when she got really ill and was hospitalized.  Recently started on Dulera 200 2 puffs twice a day which has been helping better than the Symbicort.  Still using albuterol rescue inhaler on a daily basis and had multiple courses of prednisone the last year with 2 ER visits and at least 6 urgent care visits as well.  Chest x-ray in December 2019 showed mild peribronchial thickening which may relate to chronic bronchitis or asthma.  Patient was followed by pulmonology in the past. Interim history - started [facility] with no significant benefit. She has been sleeping with a cat night. Needing her rescue inhaler before bed, during the night and in the morning. No prednisone since last visit.  Discussed with patient that the cat dander is most likely flaring her asthma symptoms.   Today's spirometry showed moderate restriction.  Start albuterol nebulizer at night and then take budesonide 0.5mg  nebulizer at night for 2 weeks and then stop.   Daily controller medication(s):Continue Dulera 200 2 puffs twice a day with spacer rinse mouth afterwards - 3 month supply.  ? Continue Spiriva1.19mcg2 puffs daily - 3 months supply.  ? Continue Singulair 10mg  daily.  ? Continue Fasenra injections every 8 weeks for 2 more injections and then we will reassess.   Prior to physical activity:May use albuterol rescue inhaler 2 puffs 5 to 15 minutes prior to strenuous physical activities.  Rescue medications:May use albuterol rescue inhaler 2 puffs or nebulizer every 4 to 6 hours as needed for shortness of breath, chest tightness, coughing, and wheezing. Monitor frequency of use.  During upper respiratory infections: Start Pulmicort 0.5mg  nebulizer 1-2 times a day for 1-2 weeks during asthma flares.   Repeat spirometry at next visit.

## 2019-08-22 NOTE — Assessment & Plan Note (Signed)
>>  ASSESSMENT AND PLAN FOR HISTORY OF FREQUENT UPPER RESPIRATORY INFECTION WRITTEN ON 08/22/2019  4:41 PM BY Garnet Sierras, DO  Past history - History of frequent upper respiratory infections including sinusitis, pneumonia and ear infections. 2020  normal immunoglobulin levels and good pneumococcal and tetanus/diptheria titers.  Interim history - No infections since last OV.  Keep track of infections.  Get flu vaccine in the fall.

## 2019-08-22 NOTE — Assessment & Plan Note (Signed)
Past history - Perennial rhinoconjunctivitis symptoms with worsening in the fall and winter for 30+ years.  Has tried Zyrtec, Singulair and Flonase as needed with some benefit.  Patient had blood work about 5 years ago which was apparently positive to cats, dogs, tree, grass and mold per patient report.  No recent ENT evaluation. 2020 skin testing was positive to: grass, tree, cat, dog, horse, mouse, ragweed, weed, mold. Interim history - Persistent symptoms but not taking medications daily.  Continue environmental control measures especially regarding the cat dander.   Take Zyrtec (cetirizine) 10mg  daily.  Continue Flonase 1-2 sprays daily.  Will start allergy immunotherapy once asthma is in better control and work schedule allows weekly injections.

## 2019-08-22 NOTE — Patient Instructions (Addendum)
Asthma:  Start albuterol nebulizer at night and then take budesonide 0.5mg  nebulizer at night for 2 weeks and then stop.   Daily controller medication(s):Continue Dulera 200 2 puffs twice a day with spacer rinse mouth afterwards - 3 month supply.  ? Continue Spiriva1.28mcg2 puffs daily - 3 months supply.  ? Continue Singulair 10mg  daily.  ? Continue Fasenra injections every 8 weeks.   Prior to physical activity:May use albuterol rescue inhaler 2 puffs 5 to 15 minutes prior to strenuous physical activities.  Rescue medications:May use albuterol rescue inhaler 2 puffs or nebulizer every 4 to 6 hours as needed for shortness of breath, chest tightness, coughing, and wheezing. Monitor frequency of use. Asthma control goals:  Full participation in all desired activities (may need albuterol before activity) Albuterol use two times or less a week on average (not counting use with activity) Cough interfering with sleep two times or less a month Oral steroids no more than once a year No hospitalizations  Seasonal and perennial allergic rhinoconjunctivitis Past history - 2020 skin testing was positive to: grass, tree, cat, dog, horse, mouse, ragweed, weed, mold.  Continue environmental control measures.  Take Zyrtec (cetirizine) 10mg  daily.  Continue Flonase 1-2 sprays daily.  Do not let cat sleep with you.   History of frequent upper respiratory infection  Keep track of infections.   Start pepcid 20mg  in the evening and continue omeprazole in the morning.   Follow up in 3 months or sooner if needed.  Get flu vaccine in the fall.   Pet Allergen Avoidance: . Contrary to popular opinion, there are no "hypoallergenic" breeds of dogs or cats. That is because people are not allergic to an animal's hair, but to an allergen found in the animal's saliva, dander (dead skin flakes) or urine. Pet allergy symptoms typically occur within minutes. For some people, symptoms can build up and  become most severe 8 to 12 hours after contact with the animal. People with severe allergies can experience reactions in public places if dander has been transported on the pet owners' clothing. Marland Kitchen Keeping an animal outdoors is only a partial solution, since homes with pets in the yard still have higher concentrations of animal allergens. . Before getting a pet, ask your allergist to determine if you are allergic to animals. If your pet is already considered part of your family, try to minimize contact and keep the pet out of the bedroom and other rooms where you spend a great deal of time. . As with dust mites, vacuum carpets often or replace carpet with a hardwood floor, tile or linoleum. . High-efficiency particulate air (HEPA) cleaners can reduce allergen levels over time. . While dander and saliva are the source of cat and dog allergens, urine is the source of allergens from rabbits, hamsters, mice and [place] pigs; so ask a non-allergic family member to clean the animal's cage. . If you have a pet allergy, talk to your allergist about the potential for allergy immunotherapy (allergy shots). This strategy can often provide long-term relief.

## 2019-08-22 NOTE — Progress Notes (Signed)
Follow Up Note  RE: [name] MRN: [id number] DOB: 30-May-1981 Date of Office Visit: 08/22/2019  Referring provider: Emeterio Reeve, DO Primary care provider: Emeterio Reeve, DO  Chief Complaint: Asthma Berna Bue not helping) and Allergies (allergies are bad and lost of post nasal drip)  History of Present Illness: I had the pleasure of seeing [name] for a follow up visit at the [facility] on 08/22/2019. She is a 38 y.o. female, who is being followed for asthma, allergic rhinoconjunctivitis, history of frequent URIs. Today she is here for regular follow up visit. Her previous allergy office visit was on 02/22/2019 with Dr. Maudie Mercury.   Asthma: Patient started on Fasenra injections with no significant benefit but had no oral prednisone or asthma flares since last OV.  Currently on Dulera 200 2 puffs BID, Spiriva 2 puffs daily, Singulair tablet.  Using albuterol 3 times a day. This occurs in the middle of the night and first thing in the morning. Otherwise she is doing okay during the day.  Patient wakes up coughing, wheezing with chest tightness. She is now living with her mother who has 2 cats and one of the cats has been sleeping with her.   No ER/urgent care visits or prednisone use since the last visit.  Takes omeprazole in the mornings and used to be on Pepcid but not anymore.   Seasonal and perennial allergic rhinoconjunctivitis Lately the cat has been sleeping in her bed. Sometimes takes zyrtec as needed and Flonase as needed but not on a regular basis. Has issues with her sinuses.   History of frequent upper respiratory infection No infections or antibiotics since the last visit.   Assessment and Plan: [name] is a 38 y.o. female with: Not well controlled severe persistent asthma Past history - Patient was diagnosed with asthma over 30 years ago.  She was doing well up until 2012 when she got really ill and was hospitalized.  Recently started on  Dulera 200 2 puffs twice a day which has been helping better than the Symbicort.  Still using albuterol rescue inhaler on a daily basis and had multiple courses of prednisone the last year with 2 ER visits and at least 6 urgent care visits as well.  Chest x-ray in December 2019 showed mild peribronchial thickening which may relate to chronic bronchitis or asthma.  Patient was followed by pulmonology in the past. Interim history - started [facility] with no significant benefit. She has been sleeping with a cat night. Needing her rescue inhaler before bed, during the night and in the morning. No prednisone since last visit.  Discussed with patient that the cat dander is most likely flaring her asthma symptoms.   Today's spirometry showed moderate restriction.  Start albuterol nebulizer at night and then take budesonide 0.5mg  nebulizer at night for 2 weeks and then stop.   Daily controller medication(s):Continue Dulera 200 2 puffs twice a day with spacer rinse mouth afterwards - 3 month supply.  ? Continue Spiriva1.54mcg2 puffs daily - 3 months supply.  ? Continue Singulair 10mg  daily.  ? Continue Fasenra injections every 8 weeks for 2 more injections and then we will reassess.   Prior to physical activity:May use albuterol rescue inhaler 2 puffs 5 to 15 minutes prior to strenuous physical activities.  Rescue medications:May use albuterol rescue inhaler 2 puffs or nebulizer every 4 to 6 hours as needed for shortness of breath, chest tightness, coughing, and wheezing. Monitor frequency of use.  During  upper respiratory infections: Start Pulmicort 0.5mg  nebulizer 1-2 times a day for 1-2 weeks during asthma flares.   Repeat spirometry at next visit.   Seasonal and perennial allergic rhinoconjunctivitis Past history - Perennial rhinoconjunctivitis symptoms with worsening in the fall and winter for 30+ years.  Has tried Zyrtec, Singulair and Flonase as needed with some benefit.  Patient had blood  work about 5 years ago which was apparently positive to cats, dogs, tree, grass and mold per patient report.  No recent ENT evaluation. 2020 skin testing was positive to: grass, tree, cat, dog, horse, mouse, ragweed, weed, mold. Interim history - Persistent symptoms but not taking medications daily.  Continue environmental control measures especially regarding the cat dander.   Take Zyrtec (cetirizine) 10mg  daily.  Continue Flonase 1-2 sprays daily.  Will start allergy immunotherapy once asthma is in better control and work schedule allows weekly injections.   History of frequent upper respiratory infection Past history - History of frequent upper respiratory infections including sinusitis, pneumonia and ear infections. 2020  normal immunoglobulin levels and good pneumococcal and tetanus/diptheria titers.  Interim history - No infections since last OV.  Keep track of infections.  Get flu vaccine in the fall.   Gastroesophageal reflux disease  Start Pepcid 20mg  in the evening.  Continue omeprazole 40mg  in the morning.  Return in about 3 months (around 11/21/2019).  Meds ordered this encounter  Medications  . budesonide (PULMICORT) 0.5 MG/2ML nebulizer solution    Sig: Take 2 mLs (0.5 mg total) by nebulization at bedtime. For 2 weeks and may use during asthma flares up to twice a day if needed.    Dispense:  120 mL    Refill:  0  . mometasone-formoterol (DULERA) 200-5 MCG/ACT AERO    Sig: Inhale 2 puffs into the lungs 2 (two) times daily.    Dispense:  39 g    Refill:  2  . montelukast (SINGULAIR) 10 MG tablet    Sig: Take 1 tablet (10 mg total) by mouth at bedtime.    Dispense:  30 tablet    Refill:  5  . Tiotropium Bromide Monohydrate (SPIRIVA RESPIMAT) 1.25 MCG/ACT AERS    Sig: Inhale 2 puffs into the lungs daily.    Dispense:  36 g    Refill:  2  . fluticasone (FLONASE) 50 MCG/ACT nasal spray    Sig: Use 1-2 sprays per nostril daily    Dispense:  16 g    Refill:  2   . famotidine (PEPCID) 20 MG tablet    Sig: Take 1 tablet (20 mg total) by mouth at bedtime.    Dispense:  30 tablet    Refill:  3  . Albuterol Sulfate (PROAIR RESPICLICK) 108 (90 Base) MCG/ACT AEPB    Sig: Inhale 2 puffs into the lungs every 4 (four) hours as needed (coughing, wheezing, shortness of breath).    Dispense:  1 each    Refill:  5   Diagnostics: Spirometry:  Tracings reviewed. Her effort: Good reproducible efforts. FVC: 2.14L FEV1: 1.94L, 59% predicted FEV1/FVC ratio: 91% Interpretation: Spirometry consistent with possible restrictive disease.  Please see scanned spirometry results for details.  Medication List:  Current Outpatient Medications  Medication Sig Dispense Refill  . Albuterol Sulfate (PROAIR RESPICLICK) 108 (90 Base) MCG/ACT AEPB Inhale 2 puffs into the lungs every 4 (four) hours as needed (coughing, wheezing, shortness of breath). 1 each 5  . cetirizine (ZYRTEC) 10 MG tablet Take 10 mg by mouth at bedtime.    Marland Kitchen  FASENRA 30 MG/ML SOSY INJECT 30 MG UNDER THE SKIN AT WEEKS 0, 4, AND 8 FOLLOWED BY EVERY 8 WEEKS THEREAFTER 1 mL 12  . fluticasone (FLONASE) 50 MCG/ACT nasal spray Use 1-2 sprays per nostril daily 16 g 2  . levalbuterol (XOPENEX) 1.25 MG/3ML nebulizer solution Take 1.25 mg by nebulization every 4 (four) hours as needed for wheezing. 72 mL 0  . mometasone-formoterol (DULERA) 200-5 MCG/ACT AERO Inhale 2 puffs into the lungs 2 (two) times daily. 39 g 2  . montelukast (SINGULAIR) 10 MG tablet Take 1 tablet (10 mg total) by mouth at bedtime. 30 tablet 5  . Multiple Vitamin (MULTIVITAMIN WITH MINERALS) TABS tablet Take 1 tablet by mouth daily. Women's One A Day    . Tiotropium Bromide Monohydrate (SPIRIVA RESPIMAT) 1.25 MCG/ACT AERS Inhale 2 puffs into the lungs daily. 36 g 2  . budesonide (PULMICORT) 0.5 MG/2ML nebulizer solution Take 2 mLs (0.5 mg total) by nebulization at bedtime. For 2 weeks and may use during asthma flares up to twice a day if needed. 120  mL 0  . famotidine (PEPCID) 20 MG tablet Take 1 tablet (20 mg total) by mouth at bedtime. 30 tablet 3  . omeprazole (PRILOSEC) 40 MG capsule Take 1 capsule (40 mg total) by mouth daily. 90 capsule 0   No current facility-administered medications for this visit.    Allergies: Allergies  Allergen Reactions  . Aspirin Hives  . Levaquin [Levofloxacin In D5w] Hives  . Pamprin [Apap-Pamabrom-Pyrilamine] Hives   I reviewed her past medical history, social history, family history, and environmental history and no significant changes have been reported from previous visit on 02/22/2019.  Review of Systems  Constitutional: Negative for appetite change, chills, fever and unexpected weight change.  HENT: Positive for congestion, postnasal drip, rhinorrhea and sneezing.   Eyes: Negative for itching.  Respiratory: Positive for cough, chest tightness, shortness of breath and wheezing.   Cardiovascular: Negative for chest pain.  Gastrointestinal: Negative for abdominal pain.  Genitourinary: Negative for difficulty urinating.  Skin: Negative for rash.  Allergic/Immunologic: Positive for environmental allergies. Negative for food allergies.  Neurological: Negative for headaches.   Objective: BP 120/82   Pulse (!) 107   Temp 98.1 F (36.7 C)   Resp 20   SpO2 97%  There is no height or weight on file to calculate BMI. Physical Exam  Constitutional: She is oriented to person, place, and time. She appears well-developed and well-nourished.  HENT:  Head: Normocephalic and atraumatic.  Right Ear: External ear normal.  Left Ear: External ear normal.  Nose: Nose normal.  Mouth/Throat: Oropharynx is clear and moist.  Eyes: Conjunctivae and EOM are normal.  Neck: Neck supple.  Cardiovascular: Normal rate, regular rhythm and normal heart sounds. Exam reveals no gallop and no friction rub.  No murmur heard. Pulmonary/Chest: Effort normal and breath sounds normal. She has no wheezes. She has no rales.   Abdominal: Soft.  Neurological: She is alert and oriented to person, place, and time.  Skin: Skin is warm. No rash noted.  Psychiatric: She has a normal mood and affect. Her behavior is normal.  Nursing note and vitals reviewed.  Previous notes and tests were reviewed. The plan was reviewed with the patient/family, and all questions/concerned were addressed.  It was my pleasure to see [name] today and participate in her care. Please feel free to contact me with any questions or concerns.  Sincerely,  Wyline MoodYoon Malva Diesing, DO Allergy & Immunology  [facility] office: [phone] [facility] office: [place] office: [phone]

## 2019-08-22 NOTE — Assessment & Plan Note (Signed)
   Start Pepcid 20mg  in the evening.  Continue omeprazole 40mg  in the morning.

## 2019-08-28 ENCOUNTER — Ambulatory Visit: Payer: Self-pay

## 2019-10-03 ENCOUNTER — Ambulatory Visit: Payer: Managed Care, Other (non HMO)

## 2019-10-04 ENCOUNTER — Ambulatory Visit: Payer: Managed Care, Other (non HMO)

## 2019-10-11 ENCOUNTER — Ambulatory Visit (INDEPENDENT_AMBULATORY_CARE_PROVIDER_SITE_OTHER): Payer: Managed Care, Other (non HMO) | Admitting: *Deleted

## 2019-10-11 ENCOUNTER — Other Ambulatory Visit: Payer: Self-pay

## 2019-10-11 DIAGNOSIS — J4550 Severe persistent asthma, uncomplicated: Secondary | ICD-10-CM

## 2019-10-11 MED ORDER — BENRALIZUMAB 30 MG/ML ~~LOC~~ SOSY
30.0000 mg | PREFILLED_SYRINGE | Freq: Once | SUBCUTANEOUS | Status: AC
  Administered 2019-10-11: 09:00:00 30 mg via SUBCUTANEOUS

## 2019-10-19 ENCOUNTER — Other Ambulatory Visit: Payer: Self-pay | Admitting: Allergy

## 2019-11-06 ENCOUNTER — Telehealth: Payer: Self-pay

## 2019-11-06 ENCOUNTER — Other Ambulatory Visit: Payer: Self-pay

## 2019-11-06 ENCOUNTER — Emergency Department (HOSPITAL_COMMUNITY): Payer: Managed Care, Other (non HMO)

## 2019-11-06 ENCOUNTER — Ambulatory Visit: Payer: Self-pay

## 2019-11-06 ENCOUNTER — Emergency Department (HOSPITAL_COMMUNITY)
Admission: EM | Admit: 2019-11-06 | Discharge: 2019-11-06 | Disposition: A | Discharge status: Home / Self Care | Payer: Managed Care, Other (non HMO) | Attending: Emergency Medicine | Admitting: Emergency Medicine

## 2019-11-06 DIAGNOSIS — J4521 Mild intermittent asthma with (acute) exacerbation: Secondary | ICD-10-CM | POA: Insufficient documentation

## 2019-11-06 DIAGNOSIS — Z87891 Personal history of nicotine dependence: Secondary | ICD-10-CM | POA: Insufficient documentation

## 2019-11-06 DIAGNOSIS — U071 COVID-19: Secondary | ICD-10-CM | POA: Insufficient documentation

## 2019-11-06 DIAGNOSIS — R0602 Shortness of breath: Secondary | ICD-10-CM | POA: Diagnosis present

## 2019-11-06 DIAGNOSIS — Z79899 Other long term (current) drug therapy: Secondary | ICD-10-CM | POA: Insufficient documentation

## 2019-11-06 DIAGNOSIS — B349 Viral infection, unspecified: Secondary | ICD-10-CM

## 2019-11-06 LAB — BASIC METABOLIC PANEL
Anion gap: 9 (ref 5–15)
BUN: 9 mg/dL (ref 6–20)
CO2: 28 mmol/L (ref 22–32)
Calcium: 8.4 mg/dL — ABNORMAL LOW (ref 8.9–10.3)
Chloride: 101 mmol/L (ref 98–111)
Creatinine, Ser: 0.68 mg/dL (ref 0.44–1.00)
GFR calc Af Amer: >60 mL/min (ref >60)
GFR calc non Af Amer: >60 mL/min (ref >60)
Glucose, Bld: 80 mg/dL (ref 70–99)
Potassium: 3.5 mmol/L (ref 3.5–5.1)
Sodium: 138 mmol/L (ref 135–145)

## 2019-11-06 LAB — CBC WITH DIFFERENTIAL/PLATELET
Abs Immature Granulocytes: 0.01 10*3/uL (ref 0.00–0.07)
Basophils Absolute: 0 10*3/uL (ref 0.0–0.1)
Basophils Relative: 0 %
Eosinophils Absolute: 0 10*3/uL (ref 0.0–0.5)
Eosinophils Relative: 0 %
HCT: 43.1 % (ref 36.0–46.0)
Hemoglobin: 14 g/dL (ref 12.0–15.0)
Immature Granulocytes: 0 %
Lymphocytes Relative: 24 %
Lymphs Abs: 1.8 10*3/uL (ref 0.7–4.0)
MCH: 28.7 pg (ref 26.0–34.0)
MCHC: 32.5 g/dL (ref 30.0–36.0)
MCV: 88.5 fL (ref 80.0–100.0)
Monocytes Absolute: 0.7 10*3/uL (ref 0.1–1.0)
Monocytes Relative: 9 %
Neutro Abs: 4.9 10*3/uL (ref 1.7–7.7)
Neutrophils Relative %: 67 %
Platelets: 353 10*3/uL (ref 150–400)
RBC: 4.87 MIL/uL (ref 3.87–5.11)
RDW: 12.9 % (ref 11.5–15.5)
WBC: 7.4 10*3/uL (ref 4.0–10.5)
nRBC: 0 % (ref 0.0–0.2)

## 2019-11-06 LAB — TROPONIN I (HIGH SENSITIVITY): Troponin I (High Sensitivity): 3 ng/L (ref <18)

## 2019-11-06 LAB — D-DIMER, QUANTITATIVE: D-Dimer, Quant: 0.45 ug/mL-FEU (ref 0.00–0.50)

## 2019-11-06 MED ORDER — DEXAMETHASONE SODIUM PHOSPHATE 10 MG/ML IJ SOLN
10.0000 mg | Freq: Once | INTRAMUSCULAR | Status: DC
  Filled 2019-11-06: qty 1

## 2019-11-06 MED ORDER — ALBUTEROL SULFATE HFA 108 (90 BASE) MCG/ACT IN AERS
8.0000 | INHALATION_SPRAY | Freq: Once | RESPIRATORY_TRACT | Status: DC
  Filled 2019-11-06: qty 6.7

## 2019-11-06 MED ORDER — BENZONATATE 100 MG PO CAPS: 100.0000 mg | ORAL_CAPSULE | Freq: Three times a day (TID) | ORAL | 0 refills | Status: DC

## 2019-11-06 MED ORDER — ALBUTEROL SULFATE HFA 108 (90 BASE) MCG/ACT IN AERS
4.0000 | INHALATION_SPRAY | Freq: Once | RESPIRATORY_TRACT | Status: AC
  Administered 2019-11-06: 5 via RESPIRATORY_TRACT

## 2019-11-06 MED ORDER — DEXAMETHASONE SODIUM PHOSPHATE 10 MG/ML IJ SOLN: 10.0000 mg | Freq: Once | INTRAMUSCULAR | Status: DC

## 2019-11-06 MED ORDER — DEXAMETHASONE SODIUM PHOSPHATE 10 MG/ML IJ SOLN
10.0000 mg | Freq: Once | INTRAMUSCULAR | Status: AC
  Administered 2019-11-06: 10 mg via INTRAVENOUS

## 2019-11-06 NOTE — ED Triage Notes (Signed)
Pt reports having SOB x1 week and losing taste and smell a couple of days ago. Pt reports mom received covid positive results 11/16.

## 2019-11-06 NOTE — Telephone Encounter (Signed)
Incoming call from Patient with a complaint of SOB and chest congestion.  Onset last Tuesday.    Patient reports having asthma.  O2 sats around 92.    Reports chest tightness.   Hr around 103.   Coughing uo brownish greenish moucous  No fever today but in past week.  Call to office to schedule.  Recommended virtual visit.  Patient disagreed.  [facility]ended[facility] Urgent care  If Patient wanted to be seen in person.  Patient hung up.         Answer Assessment - Initial Assessment Questions 1. RESPIRATORY STATUS: "Describe your breathing?" (e.g., wheezing, shortness of breath, unable to speak, severe coughing)      SOB 2. ONSET: "When did this breathing problem begin?"     Last Tuesday 3. PATTERN "Does the difficult breathing come and go, or has it been constant since it started?"      constant 4. SEVERITY: "How bad is your breathing?" (e.g., mild, moderate, severe)    - MILD: No SOB at rest, mild SOB with walking, speaks normally in sentences, can lay down, no retractions, pulse < 100.    - MODERATE: SOB at rest, SOB with minimal exertion and prefers to sit, cannot lie down flat, speaks in phrases, mild retractions, audible wheezing, pulse 100-120.    - SEVERE: Very SOB at rest, speaks in single words, struggling to breathe, sitting hunched forward, retractions, pulse > 120     Moderate or sever.   5. RECURRENT SYMPTOM: "Have you had difficulty breathing before?" If so, ask: "When was the last time?" and "What happened that time?"      *No Answer* 6. CARDIAC HISTORY: "Do you have any history of heart disease?" (e.g., heart attack, angina, bypass surgery, angioplasty)     7. LUNG HISTORY: "Do you have any history of lung disease?"  (e.g., pulmonary embolus, asthma, emphysema)      asthma 8. CAUSE: "What do you think is causing the breathing problem?"      *No Answer* 9. OTHER SYMPTOMS: "Do you have any other symptoms? (e.g., dizziness, runny nose, cough, chest pain, fever)     Cough cough  wheezing.   10. PREGNANCY: "Is there any chance you are pregnant?" "When was your last menstrual period?"     denies 11. TRAVEL: "Have you traveled out of the country in the last month?" (e.g., travel history, exposures)     denies  Answer Assessment - Initial Assessment Questions 1. LOCATION: "Where does it hurt?"     "just hurts  2. RADIATION: "Does the pain go anywhere else?" (e.g., into neck, jaw, arms, back)    Back and ribs  3. ONSET: "When did the chest pain begin?" (Minutes, hours or days)     Last week  4. PATTERN "Does the pain come and go, or has it been constant since it started?"  "Does it get worse with exertion?"     Comes and goes cough it hurts 5. DURATION: "How long does it last" (e.g., seconds, minutes, hours)     *No Answer* 6. SEVERITY: "How bad is the pain?"  (e.g., Scale 1-10; mild, moderate, or severe)    - MILD (1-3): doesn't interfere with normal activities     - MODERATE (4-7): interferes with normal activities or awakens from sleep    - SEVERE (8-10): excruciating pain, unable to do any normal activities      Mild and moderate 7. CARDIAC RISK FACTORS: "Do you have any history of heart problems  or risk factors for heart disease?" (e.g., angina, prior heart attack; diabetes, high blood pressure, high cholesterol, smoker, or strong family history of heart disease)     *deniesLMONARY RISK FACTORS: "Do you have any history of lung disease?"  (e.g., blood clots in lung, asthma, emphysema, birth control pills)   asthma 9. CAUSE: "What do you think is causing the chest pain?"     *No Answer* 10. OTHER SYMPTOMS: "Do you have any other symptoms?" (e.g., dizziness, nausea, vomiting, sweating, fever, difficulty breathing, cough)      Dizziness,   Greenish brown mucous 11. PREGNANCY: "Is there any chance you are pregnant?" "When was your last menstrual period?"       denies  Protocols used: BREATHING DIFFICULTY-A-AH, CHEST PAIN-A-AH

## 2019-11-06 NOTE — Telephone Encounter (Signed)
Policy is policy. I agree with recommendations.

## 2019-11-06 NOTE — Discharge Instructions (Addendum)
Your labwork, EKG, and chest xray were reassuring today. We have given you a dose of steroids in the ED as well as multiple puffs of an inhaler.   Please continue using your inhalers at home as prescribed. I have prescribed short course of medication to help with the coughing. Stay home and self isolate until you receive your covid results - if positive you will need to stay home and self isolate for 2 weeks.   Return to the ED immediately for any worsening symptoms including worsening chest pain, worsening shortness of breath/wheezing uncontrolled with inhalers at home, coughing up blood, or feeling like you could pass out.      Person Under Monitoring Name: [name]  Location: [address] [place] [address]   Infection Prevention Recommendations for Individuals Confirmed to have, or Being Evaluated for, 2019 Novel Coronavirus (COVID-19) Infection Who Receive Care at Home  Individuals who are confirmed to have, or are being evaluated for, COVID-19 should follow the prevention steps below until a healthcare provider or local or state health department says they can return to normal activities.  Stay home except to get medical care You should restrict activities outside your home, except for getting medical care. Do not go to work, school, or public areas, and do not use public transportation or taxis.  Call ahead before visiting your doctor Before your medical appointment, call the healthcare provider and tell them that you have, or are being evaluated for, COVID-19 infection. This will help the healthcare providers office take steps to keep other people from getting infected. Ask your healthcare provider to call the local or state health department.  Monitor your symptoms Seek prompt medical attention if your illness is worsening (e.g., difficulty breathing). Before going to your medical appointment, call the healthcare provider and tell them that you have, or are being  evaluated for, COVID-19 infection. Ask your healthcare provider to call the local or state health department.  Wear a facemask You should wear a facemask that covers your nose and mouth when you are in the same room with other people and when you visit a healthcare provider. People who live with or visit you should also wear a facemask while they are in the same room with you.  Separate yourself from other people in your home As much as possible, you should stay in a different room from other people in your home. Also, you should use a separate bathroom, if available.  Avoid sharing household items You should not share dishes, drinking glasses, cups, eating utensils, towels, bedding, or other items with other people in your home. After using these items, you should wash them thoroughly with soap and water.  Cover your coughs and sneezes Cover your mouth and nose with a tissue when you cough or sneeze, or you can cough or sneeze into your sleeve. Throw used tissues in a lined trash can, and immediately wash your hands with soap and water for at least 20 seconds or use an alcohol-based hand rub.  Wash your [employer] your hands often and thoroughly with soap and water for at least 20 seconds. You can use an alcohol-based hand sanitizer if soap and water are not available and if your hands are not visibly dirty. Avoid touching your eyes, nose, and mouth with unwashed hands.   Prevention Steps for Caregivers and Household Members of Individuals Confirmed to have, or Being Evaluated for, COVID-19 Infection Being Cared for in the Home  If you  live with, or provide care at home for, a person confirmed to have, or being evaluated for, COVID-19 infection please follow these guidelines to prevent infection:  Follow healthcare providers instructions Make sure that you understand and can help the patient follow any healthcare provider instructions for all care.  Provide for the patients  basic needs You should help the patient with basic needs in the home and provide support for getting groceries, prescriptions, and other personal needs.  Monitor the patients symptoms If they are getting sicker, call his or her medical provider and tell them that the patient has, or is being evaluated for, COVID-19 infection. This will help the healthcare providers office take steps to keep other people from getting infected. Ask the healthcare provider to call the local or state health department.  Limit the number of people who have contact with the patient If possible, have only one caregiver for the patient. Other household members should stay in another home or place of residence. If this is not possible, they should stay in another room, or be separated from the patient as much as possible. Use a separate bathroom, if available. Restrict visitors who do not have an essential need to be in the home.  Keep older adults, very young children, and other sick people away from the patient Keep older adults, very young children, and those who have compromised immune systems or chronic health conditions away from the patient. This includes people with chronic heart, lung, or kidney conditions, diabetes, and cancer.  Ensure good ventilation Make sure that shared spaces in the home have good air flow, such as from an air conditioner or an opened window, weather permitting.  Wash your hands often Wash your hands often and thoroughly with soap and water for at least 20 seconds. You can use an alcohol based hand sanitizer if soap and water are not available and if your hands are not visibly dirty. Avoid touching your eyes, nose, and mouth with unwashed hands. Use disposable paper towels to dry your hands. If not available, use dedicated cloth towels and replace them when they become wet.  Wear a facemask and gloves Wear a disposable facemask at all times in the room and gloves when you touch or  have contact with the patients blood, body fluids, and/or secretions or excretions, such as sweat, saliva, sputum, nasal mucus, vomit, urine, or feces.  Ensure the mask fits over your nose and mouth tightly, and do not touch it during use. Throw out disposable facemasks and gloves after using them. Do not reuse. Wash your hands immediately after removing your facemask and gloves. If your personal clothing becomes contaminated, carefully remove clothing and launder. Wash your hands after handling contaminated clothing. Place all used disposable facemasks, gloves, and other waste in a lined container before disposing them with other household waste. Remove gloves and wash your hands immediately after handling these items.  Do not share dishes, glasses, or other household items with the patient Avoid sharing household items. You should not share dishes, drinking glasses, cups, eating utensils, towels, bedding, or other items with a patient who is confirmed to have, or being evaluated for, COVID-19 infection. After the person uses these items, you should wash them thoroughly with soap and water.  Wash laundry thoroughly Immediately remove and wash clothes or bedding that have blood, body fluids, and/or secretions or excretions, such as sweat, saliva, sputum, nasal mucus, vomit, urine, or feces, on them. Wear gloves when handling laundry from the patient.  Read and follow directions on labels of laundry or clothing items and detergent. In general, wash and dry with the warmest temperatures recommended on the label.  Clean all areas the individual has used often Clean all touchable surfaces, such as counters, tabletops, doorknobs, bathroom fixtures, toilets, phones, keyboards, tablets, and bedside tables, every day. Also, clean any surfaces that may have blood, body fluids, and/or secretions or excretions on them. Wear gloves when cleaning surfaces the patient has come in contact with. Use a diluted  bleach solution (e.g., dilute bleach with 1 part bleach and 10 parts water) or a household disinfectant with a label that says EPA-registered for coronaviruses. To make a bleach solution at home, add 1 tablespoon of bleach to 1 quart (4 cups) of water. For a larger supply, add  cup of bleach to 1 gallon (16 cups) of water. Read labels of cleaning products and follow recommendations provided on product labels. Labels contain instructions for safe and effective use of the cleaning product including precautions you should take when applying the product, such as wearing gloves or eye protection and making sure you have good ventilation during use of the product. Remove gloves and wash hands immediately after cleaning.  Monitor yourself for signs and symptoms of illness Caregivers and household members are considered close contacts, should monitor their health, and will be asked to limit movement outside of the home to the extent possible. Follow the monitoring steps for close contacts listed on the symptom monitoring form.   ? If you have additional questions, contact your local health department or call the epidemiologist on call at [phone] (available 24/7). ? This guidance is subject to change. For the most up-to-date guidance from [facility], please refer to their website: [email or web address]

## 2019-11-06 NOTE — Telephone Encounter (Signed)
[name] called and reports a cough and congestion. She was wanting to be seen in the office. She believes she may need a breathing treatment. She refused a virtual visit. I did suggest that the urgent care is open until 8 pm tonight. She states she will go to an urgent care. She was upset because she could not be seen in our office.

## 2019-11-06 NOTE — ED Provider Notes (Signed)
[facility] DEPT Provider Note   CSN: [id number] Arrival date & time: 11/06/19  1245     History   Chief Complaint Chief Complaint  Patient presents with  . Shortness of Breath    HPI [name] is a 38 y.o. female with PMHx asthma and GERD who presents to the ED today with complaints of gradual onset, constant, shortness of breath x 1 week. Pt also complains of a dry cough and loss of taste/smell. Pt reports her mom tested positive for covid 68 yesterday.  Patient states that she has been using her inhalers at home without relief.  She attempted to call her PCP today to schedule an appointment to have a breathing treatment but was told to go to an urgent care instead.  Patient states that she went to urgent care and they told her they were unable to do a breathing treatment due to global pandemic and sent her here for further evaluation. Pt reports she has had fevers with tmax 101 a couple of days ago. She reports her temps have been around 68 recently. Has not taken any Ibuprofen or Tylenol today. Denies chills, abdominal pain, nausea, vomiting, diarrhea, or any other associated symptoms.        Past Medical History:  Diagnosis Date  . Asthma   . GERD (gastroesophageal reflux disease)   . History of chicken pox   . Pneumonia 2017  . UTI (urinary tract infection)     Patient Active Problem List   Diagnosis Date Noted  . Not well controlled severe persistent asthma 08/22/2019  . Gastroesophageal reflux disease 08/22/2019  . Seasonal and perennial allergic rhinoconjunctivitis 02/22/2019  . Other allergic rhinitis 01/24/2019  . History of frequent upper respiratory infection 01/24/2019  . Left elbow fracture, closed, initial encounter 12/16/2018  . Rhinitis, chronic 05/11/2018  . Diarrhea 11/19/2016  . Rectal bleeding 11/19/2016  . Abdominal pain 11/19/2016  . Asthma exacerbation 10/10/2016  . ADD (attention deficit disorder) 10/10/2016  .  Depression with anxiety 08/13/2014  . Morbid obesity due to excess calories ([place]) 08/13/2014  . Leukocytosis 11/27/2012  . Dehydration 11/27/2012  . Sinus tachycardia 11/27/2012  . Hypokalemia 11/26/2012  . Extrinsic asthma 08/17/2011    Past Surgical History:  Procedure Laterality Date  . CESAREAN SECTION  09/27/12  . NO PAST SURGERIES    . ORIF ELBOW FRACTURE Left 12/16/2018   Procedure: Left elbow radial head resection and arthroplasty with ligament repair as necessary;  Surgeon: Roseanne Kaufman, MD;  Location: [facility];  Service: Orthopedics;  Laterality: Left;  2 hrs     OB History   No obstetric history on file.      Home Medications    Prior to Admission medications   Medication Sig Start Date End Date Taking? Authorizing Provider  Albuterol Sulfate (PROAIR RESPICLICK) 195 (90 Base) MCG/ACT AEPB Inhale 2 puffs into the lungs every 4 (four) hours as needed (coughing, wheezing, shortness of breath). 08/22/19   Garnet Sierras, DO  benzonatate (TESSALON) 100 MG capsule Take 1 capsule (100 mg total) by mouth every 8 (eight) hours. 11/06/19   Ily Denno, PA-C  budesonide (PULMICORT) 0.5 MG/2ML nebulizer solution TAKE 2 MLS (0.5 MG) BY NEBULIZATION AT BEDTIME. FOR 2 WEEKS AND MAY USE DURING ASTHMA FLARES UP TO TWICE A DAY IF NEEDED. 10/19/19   Garnet Sierras, DO  cetirizine (ZYRTEC) 10 MG tablet Take 10 mg by mouth at bedtime.    [provider]  famotidine (  PEPCID) 20 MG tablet Take 1 tablet (20 mg total) by mouth at bedtime. 08/22/19   Ellamae SiaKim, Yoon M, DO  FASENRA 30 MG/ML SOSY INJECT 30 MG UNDER THE SKIN AT WEEKS 0, 4, AND 8 FOLLOWED BY EVERY 8 WEEKS THEREAFTER 05/17/19   Ellamae SiaKim, Yoon M, DO  fluticasone [facility](FLONASE) 50 MCG/ACT nasal spray Use 1-2 sprays per nostril daily 08/22/19   Ellamae SiaKim, Yoon M, DO  levalbuterol (XOPENEX) 1.25 MG/3ML nebulizer solution Take 1.25 mg by nebulization every 4 (four) hours as needed for wheezing. 12/09/18   Jacalyn LefevreHaviland, Julie, MD  mometasone-formoterol (DULERA)  200-5 MCG/ACT AERO Inhale 2 puffs into the lungs 2 (two) times daily. 08/22/19   Ellamae SiaKim, Yoon M, DO  montelukast (SINGULAIR) 10 MG tablet Take 1 tablet (10 mg total) by mouth at bedtime. 08/22/19   Ellamae SiaKim, Yoon M, DO  Multiple Vitamin (MULTIVITAMIN WITH MINERALS) TABS tablet Take 1 tablet by mouth daily. Women's One A Day    [provider]  omeprazole (PRILOSEC) 40 MG capsule Take 1 capsule (40 mg total) by mouth daily. 01/08/19   Sunnie NielsenAlexander, Natalie, DO  Tiotropium Bromide Monohydrate (SPIRIVA RESPIMAT) 1.25 MCG/ACT AERS Inhale 2 puffs into the lungs daily. 08/22/19   Ellamae SiaKim, Yoon M, DO    Family History Family History  Problem Relation Age of Onset  . Allergies Sister   . Cancer Sister        fallopian tube  . Fibroids Mother        Malignant  . Asthma Mother   . Diabetes Father   . Colon cancer Maternal Grandfather   . Diabetes Paternal Grandmother   . Liver cancer Paternal Aunt   . Asthma Maternal Uncle   . Asthma Maternal Grandmother   . Colon polyps Neg Hx   . Esophageal cancer Neg Hx   . Stomach cancer Neg Hx   . Rectal cancer Neg Hx   . Allergic rhinitis Neg Hx     Social History Social History   Tobacco Use  . Smoking status: Former Smoker    Quit date: 2004    Years since quitting: 16.8  . Smokeless tobacco: Never Used  . Tobacco comment: smoked 1-2 months then quit  Substance Use Topics  . Alcohol use: No  . Drug use: No     Allergies   Aspirin, Levaquin [levofloxacin in d5w], and Pamprin [apap-pamabrom-pyrilamine]   Review of Systems Review of Systems  Constitutional: Positive for fever. Negative for chills.  HENT: Negative for congestion.   Eyes: Negative for visual disturbance.  Respiratory: Positive for cough and shortness of breath.   Cardiovascular: Negative for leg swelling.  Gastrointestinal: Negative for abdominal pain, nausea and vomiting.  Genitourinary: Negative for difficulty urinating.  Musculoskeletal: Negative for myalgias.  Skin: Negative  for rash.  Neurological: Negative for headaches.     Physical Exam Updated Vital Signs BP 127/65   Pulse 100   Temp 97.9 F (36.6 C) (Oral)   Resp 18   Ht 5\' 6"  (1.676 m)   Wt 127 kg   SpO2 100%   BMI 45.19 kg/m   Physical Exam Vitals signs and nursing note reviewed.  Constitutional:      Appearance: She is obese. She is not ill-appearing or diaphoretic.  HENT:     Head: Normocephalic and atraumatic.  Eyes:     Conjunctiva/sclera: Conjunctivae normal.  Neck:     Musculoskeletal: Neck supple.  Cardiovascular:     Rate and Rhythm: Normal rate and regular rhythm.  Pulses: Normal pulses.  Pulmonary:     Effort: Pulmonary effort is normal.     Breath sounds: Wheezing present. No decreased breath sounds, rhonchi or rales.  Chest:     Chest wall: No tenderness.  Abdominal:     Palpations: Abdomen is soft.     Tenderness: There is no abdominal tenderness. There is no guarding or rebound.  Musculoskeletal:     Right lower leg: No edema.     Left lower leg: No edema.  Skin:    General: Skin is warm and dry.  Neurological:     Mental Status: She is alert.      ED Treatments / Results  Labs (all labs ordered are listed, but only abnormal results are displayed) Labs Reviewed  BASIC METABOLIC PANEL - Abnormal; Notable for the following components:      Result Value   Calcium 8.4 (*)    All other components within normal limits  NOVEL CORONAVIRUS, NAA (HOSP ORDER, SEND-OUT TO REF LAB; TAT 18-24 HRS)  CBC WITH DIFFERENTIAL/PLATELET  D-DIMER, QUANTITATIVE (NOT AT [facility])  TROPONIN I (HIGH SENSITIVITY)  TROPONIN I (HIGH SENSITIVITY)    EKG None  Radiology Dg Chest Port 1 View  Result Date: 11/06/2019 CLINICAL DATA:  Cough and shortness of breath EXAM: PORTABLE CHEST 1 VIEW COMPARISON:  December 09, 2018 FINDINGS: Lungs are clear. Heart size and pulmonary vascularity are normal. No adenopathy. No bone lesions. IMPRESSION: No edema or consolidation.  No evident  adenopathy. Electronically Signed   By: Bretta Bang III M.D.   On: 11/06/2019 14:53    Procedures Procedures (including critical care time)  Medications Ordered in ED Medications  albuterol (VENTOLIN HFA) 108 (90 Base) MCG/ACT inhaler 4-6 puff (5 puffs Inhalation Given 11/06/19 1441)  dexamethasone (DECADRON) injection 10 mg (10 mg Intravenous Given 11/06/19 1437)     Initial Impression / Assessment and Plan / ED Course  I have reviewed the triage vital signs and the nursing notes.  Pertinent labs & imaging results that were available during my care of the patient were reviewed by me and considered in my medical decision making (see chart for details).    38 year old female with history of asthma who presents to the ED today complaining of shortness of breath, loss of taste/smell, cough for the past week.  Reports she is having an asthma exacerbation and came to the ED from urgent care for a breathing treatment.  She was told at urgent care that she could not have a breathing treatment due to global pandemic policies.  I had lengthy discussion with patient regarding the fact that we also unfortunately cannot do breathing treatments currently.  Pt seems frustrated with this as her primary care provider would not see her in the office as well today.  Her mother did recently test positive for COVID-19.  She does have some wheezing on her exam but she is able to speak in full sentences without difficulty.  She is satting above 95% on room air. SOB may be more related to covid 19 than asthma exacerbation. Will give multiple puffs of albuterol inhaler and Decadron injection in the ED today and reevaluate.  Chest x-ray as well as Covid test ordered. Will ambulate pt prior to discharge to ensure she does not desaturate.   Chest xray negative. CBC without leukocytosis. No electrolyte abnormalities on BMP. Upon reevaluation pt states she does not feel improved after decadron and 5 puffs albuterol;  reports that she is having chest pain  which she initially denied. Reports a tightness in her chest since last week but has progressively gotten worse. Will add d dimer and trop at this time. Another 4 puffs albuterol given as well.   Trop and dimer negative. Trop < 4; do not feel she needs repeat at this time. After another 4 puffs of albuterol pt states she feels improved and ready to go home. She is no longer wheezing on exam. She was ambulated and her oxygen remained above 94% on RA. Will give pt tessalon perles to help with coughing. Pt advised to continue using inhalers at home and to self isolate until she receives her covid results. Strict return precautions have been discussed with pt including worsening shortness of breath/wheezing uncontrolled with inhalers at home, coughing up blood, or feeling like she could pass out. Pt is in agreement with plan at this time.   This note was prepared using Dragon voice recognition software and may include unintentional dictation errors due to the inherent limitations of voice recognition software.  [name] was evaluated in Emergency Department on 11/06/2019 for the symptoms described in the history of present illness. She was evaluated in the context of the global COVID-19 pandemic, which necessitated consideration that the patient might be at risk for infection with the SARS-CoV-2 virus that causes COVID-19. Institutional protocols and algorithms that pertain to the evaluation of patients at risk for COVID-19 are in a state of rapid change based on information released by regulatory bodies including the CDC and federal and state organizations. These policies and algorithms were followed during the patient's care in the ED.       Final Clinical Impressions(s) / ED Diagnoses   Final diagnoses:  Viral illness  Shortness of breath  Mild intermittent asthma with exacerbation    ED Discharge Orders         Ordered    benzonatate (TESSALON) 100 MG  capsule  Every 8 hours     11/06/19 1710           Tanda Rockers, PA-C 11/06/19 1713    Linwood Dibbles, MD 11/07/19 [phone]

## 2019-11-06 NOTE — ED Notes (Addendum)
Pt's baseline SpO2 laying was at 97% Room Air, Pt ambulation walking approximately 76ft in room, pt reported no abnormal symptoms, pt SpO2 maintained 94% Room Air w/ ambulation.

## 2019-11-07 ENCOUNTER — Telehealth: Payer: Self-pay

## 2019-11-07 LAB — NOVEL CORONAVIRUS, NAA (HOSP ORDER, SEND-OUT TO REF LAB; TAT 18-24 HRS): SARS-CoV-2, NAA: DETECTED — AB

## 2019-11-07 NOTE — Telephone Encounter (Signed)
PA initiated for Proair Respiclick. Patient made aware that insurance not wanting to cover medication. Patient advise to me that the [facility] works better for her rather then [facility]. Pending approval.

## 2019-11-08 NOTE — Telephone Encounter (Signed)
Approval has been faxed to pharmacy. Patient has also been notified via my chart

## 2019-11-08 NOTE — Telephone Encounter (Signed)
This request has been approved. Please note any additional information provided by [employer] at the bottom of your screen.  Approvedon November 18  CaseId:[id number];Status:Approved;Review Type:Qty;Coverage Start Date:10/08/2019;Coverage End Date:12/07/2019;

## 2019-11-28 ENCOUNTER — Ambulatory Visit: Payer: Managed Care, Other (non HMO) | Admitting: Allergy

## 2019-11-29 ENCOUNTER — Encounter: Payer: Self-pay | Admitting: Allergy

## 2019-11-29 ENCOUNTER — Ambulatory Visit (INDEPENDENT_AMBULATORY_CARE_PROVIDER_SITE_OTHER): Payer: Managed Care, Other (non HMO) | Admitting: Allergy

## 2019-11-29 ENCOUNTER — Other Ambulatory Visit: Payer: Self-pay

## 2019-11-29 DIAGNOSIS — J302 Other seasonal allergic rhinitis: Secondary | ICD-10-CM

## 2019-11-29 DIAGNOSIS — J3089 Other allergic rhinitis: Secondary | ICD-10-CM

## 2019-11-29 DIAGNOSIS — H1010 Acute atopic conjunctivitis, unspecified eye: Secondary | ICD-10-CM

## 2019-11-29 DIAGNOSIS — J4550 Severe persistent asthma, uncomplicated: Secondary | ICD-10-CM | POA: Diagnosis not present

## 2019-11-29 DIAGNOSIS — Z889 Allergy status to unspecified drugs, medicaments and biological substances status: Secondary | ICD-10-CM | POA: Insufficient documentation

## 2019-11-29 DIAGNOSIS — U071 COVID-19: Secondary | ICD-10-CM | POA: Insufficient documentation

## 2019-11-29 DIAGNOSIS — Z8709 Personal history of other diseases of the respiratory system: Secondary | ICD-10-CM

## 2019-11-29 DIAGNOSIS — K219 Gastro-esophageal reflux disease without esophagitis: Secondary | ICD-10-CM

## 2019-11-29 MED ORDER — LEVALBUTEROL HCL 1.25 MG/3ML IN NEBU: 1.2500 mg | INHALATION_SOLUTION | RESPIRATORY_TRACT | 1 refills | Status: DC | PRN

## 2019-11-29 MED ORDER — BUDESONIDE 0.5 MG/2ML IN SUSP: 0.5000 mg | Freq: Two times a day (BID) | RESPIRATORY_TRACT | 1 refills | Status: DC

## 2019-11-29 MED ORDER — ALBUTEROL SULFATE HFA 108 (90 BASE) MCG/ACT IN AERS: 2.0000 | INHALATION_SPRAY | RESPIRATORY_TRACT | 1 refills | Status: DC | PRN

## 2019-11-29 MED ORDER — SPIRIVA RESPIMAT 1.25 MCG/ACT IN AERS: 2.0000 | INHALATION_SPRAY | Freq: Every day | RESPIRATORY_TRACT | 1 refills | Status: DC

## 2019-11-29 NOTE — Assessment & Plan Note (Signed)
Past history - Patient was diagnosed with asthma over 30 years ago.  She was doing well up until 2012 when she got really ill and was hospitalized.  Recently started on Dulera 200 2 puffs twice a day which has been helping better than the Symbicort.  Still using albuterol rescue inhaler on a daily basis and had multiple courses of prednisone the last year with 2 ER visits and at least 6 urgent care visits as well.  Chest x-ray in December 2019 showed mild peribronchial thickening which may relate to chronic bronchitis or asthma.  Patient was followed by pulmonology in the past. Interim history - Patient was doing better until COVID-19 diagnosis in November. Still having coughing, wheezing and shortness of breath but slowly feeling better. Went to ER and had steroid injection.   Start albuterol nebulizer twice a day for 1 week and then use as needed.  Start budesonide 0.5mg  nebulizer twice a day until you see me in 1 month.   If not doing better then let us know, may need a course of prednisone.   Daily controller medication(s):Continue Dulera 200 2 puffs twice a day with spacer rinse mouth afterwards.  ? ContinueSpiriva1.72mcg2 puffs daily.  ? Continue Singulair 10mg  daily.  ? Continue Fasenra injections every 8 weeks for 2 more injections and then we will reassess.   Prior to physical activity:May use albuterol rescue inhaler 2 puffs 5 to 15 minutes prior to strenuous physical activities.  Rescue medications:May use albuterol rescue inhaler 2 puffs or nebulizer every 4 to 6 hours as needed for shortness of breath, chest tightness, coughing, and wheezing. Monitor frequency of use.  During upper respiratory infections: Start Pulmicort 0.5mg  nebulizer 2 times a day for 1-2 weeks during asthma flares.   Repeat spirometry at next visit.

## 2019-11-29 NOTE — Assessment & Plan Note (Signed)
Past history - Perennial rhinoconjunctivitis symptoms with worsening in the fall and winter for 30+ years.  No recent ENT evaluation. 2020 skin testing was positive to: grass, tree, cat, dog, horse, mouse, ragweed, weed, mold. Interim history - improved since not sleeping with cat.   Continue environmental control measures especially regarding the cat dander.   Take Zyrtec (cetirizine) 10mg  daily.  Continue Flonase 1-2 sprays daily.  Will start allergy immunotherapy once asthma is in better control and work schedule allows weekly injections.

## 2019-11-29 NOTE — Assessment & Plan Note (Signed)
Stable.   Continue Pepcid 20mg  in the evening.  Continue omeprazole 40mg  in the morning.

## 2019-11-29 NOTE — Assessment & Plan Note (Signed)
>>  ASSESSMENT AND PLAN FOR HISTORY OF FREQUENT UPPER RESPIRATORY INFECTION WRITTEN ON 11/29/2019  3:56 PM BY Garnet Sierras, DO  Past history - History of frequent upper respiratory infections including sinusitis, pneumonia and ear infections. 2020 normal immunoglobulin levels and good pneumococcal and tetanus/diptheria titers.  Interim history - one infection but not requiring antibiotics.   Keep track of infections.  Get flu vaccine when feeling better.  Will recheck basic immune bloodwork at next visit.

## 2019-11-29 NOTE — Progress Notes (Signed)
RE: SHALA BAUMBACH MRN: [id number] DOB: July 01, 1981 Date of Telemedicine Visit: 11/29/2019  Referring provider: Sunnie Nielsen, DO Primary care provider: Sunnie Nielsen, DO  Chief Complaint: Asthma (Televisit at home. Patient gave verbal consent to treat and bill insurance for this visit.)   Telemedicine Follow Up Visit via Telephone: I connected with Nathaniel Yaden for a follow up on 11/29/19 by telephone and verified that I am speaking with the correct person using two identifiers.   I discussed the limitations, risks, security and privacy concerns of performing an evaluation and management service by telephone and the availability of in person appointments. I also discussed with the patient that there may be a patient responsible charge related to this service. The patient expressed understanding and agreed to proceed.  Patient is at home/work. Provider is at the office.  Visit start time: 3:01PM Visit end time: 3:31PM Insurance consent/check in by: front desk Medical consent and medical assistant/nurse: Vonzell Schlatter.  History of Present Illness: She is a 38 y.o. female, who is being followed for asthma, allergic rhinoconjunctivitis, history of frequent upper respiratory infections and GERD. Her previous allergy office visit was on 08/22/2019 with Dr. Selena Batten. Today is a regular follow up visit.  Asthma: ACT score 9. Patient has been having issues with her breathing with the weather changes.   The nebulizer seemed to help more at night which helps her sleep better. Only did it for 2 weeks.   Currently on Dulera 200 2 puffs BID and Singulair daily.  Ran out Spiriva a few weeks ago. Still getting Fasenra injections and scheduled next week.   Cat is not sleeping with her anymore which is helping her allergies.   She had trouble breathing a week before Thanksgiving and her oxygen level was down in the 80s prompting her to go to the ER. She was diagnosed with COVID and was given  steroid injection.   Using albuterol a week before thanksgiving and went to ER. Had positive COVID-19 testing. Patient had dexamethasone injection.  Fevers, coughing and GI issues are improved and her breathing is slowly getting better. Anosmia is still there.  Pulse oximetry today was 96%.  No history of blood clots or bleeding but had reaction to aspirin as a child.   Patient's son was positive to COVID as well.  Seasonal and perennial allergic rhinoconjunctivitis Lot better since the cat is not around her.  History of frequent upper respiratory infection Did not get flu vaccine yet.   Gastroesophageal reflux disease Takes Pepcid 20mg  and omeprazole with good benefit.   Assessment and Plan: Genia is a 38 y.o. female with: Not well controlled severe persistent asthma Past history - Patient was diagnosed with asthma over 30 years ago.  She was doing well up until 2012 when she got really ill and was hospitalized.  Recently started on Dulera 200 2 puffs twice a day which has been helping better than the Symbicort.  Still using albuterol rescue inhaler on a daily basis and had multiple courses of prednisone the last year with 2 ER visits and at least 6 urgent care visits as well.  Chest x-ray in December 2019 showed mild peribronchial thickening which may relate to chronic bronchitis or asthma.  Patient was followed by pulmonology in the past. Interim history - Patient was doing better until COVID-19 diagnosis in November. Still having coughing, wheezing and shortness of breath but slowly feeling better. Went to ER and had steroid injection.   Start albuterol nebulizer twice a  day for 1 week and then use as needed.  Start budesonide 0.5mg  nebulizer twice a day until you see me in 1 month.   If not doing better then let us know, may need a course of prednisone.   Daily controller medication(s):Continue Dulera 200 2 puffs twice a day with spacer rinse mouth afterwards.  ?  ContinueSpiriva1.76mcg2 puffs daily.  ? Continue Singulair 10mg  daily.  ? Continue Fasenra injections every 8 weeks for 2 more injections and then we will reassess.   Prior to physical activity:May use albuterol rescue inhaler 2 puffs 5 to 15 minutes prior to strenuous physical activities.  Rescue medications:May use albuterol rescue inhaler 2 puffs or nebulizer every 4 to 6 hours as needed for shortness of breath, chest tightness, coughing, and wheezing. Monitor frequency of use.  During upper respiratory infections: Start Pulmicort 0.5mg  nebulizer 2 times a day for 1-2 weeks during asthma flares.   Repeat spirometry at next visit.   COVID-19 virus infection Diagnosed on 11/17 and doing slowly better.  If have acute worsening of symptoms please go to ER/urgent care for further evaluation.  If you are able to, check pulse oximetry and if below 94-92% please go to ER.  The following supplements may help:   Vitamin C 500mg  twice a day and Quercetin 250-500 mg twice a day  Vitamin D3 2000 - 4000 u/day  B Complex vitamins  Zinc 75-100 mg/day  Melatonin 6-10 mg at night (the optimal dose is unknown)  Seasonal and perennial allergic rhinoconjunctivitis Past history - Perennial rhinoconjunctivitis symptoms with worsening in the fall and winter for 30+ years.  No recent ENT evaluation. 2020 skin testing was positive to: grass, tree, cat, dog, horse, mouse, ragweed, weed, mold. Interim history - improved since not sleeping with cat.   Continue environmental control measures especially regarding the cat dander.   Take Zyrtec (cetirizine) 10mg  daily.  Continue Flonase 1-2 sprays daily.  Will start allergy immunotherapy once asthma is in better control and work schedule allows weekly injections.   History of frequent upper respiratory infection Past history - History of frequent upper respiratory infections including sinusitis, pneumonia and ear infections. 2020 normal  immunoglobulin levels and good pneumococcal and tetanus/diptheria titers.  Interim history - one infection but not requiring antibiotics.   Keep track of infections.  Get flu vaccine when feeling better.  Will recheck basic immune bloodwork at next visit.   Gastroesophageal reflux disease Stable.   Continue Pepcid 20mg  in the evening.  Continue omeprazole 40mg  in the morning.  Multiple drug allergies Reaction to aspirin in the form of hives as a child.  Continue to avoid aspirin, Levaquin and Pamprin.   Consider drug challenge once asthma is better controlled.   Return in about 4 weeks (around 12/27/2019).  Meds ordered this encounter  Medications  . Tiotropium Bromide Monohydrate (SPIRIVA RESPIMAT) 1.25 MCG/ACT AERS    Sig: Inhale 2 puffs into the lungs daily.    Dispense:  12 g    Refill:  1  . budesonide (PULMICORT) 0.5 MG/2ML nebulizer solution    Sig: Take 2 mLs (0.5 mg total) by nebulization 2 (two) times daily.    Dispense:  360 mL    Refill:  1  . levalbuterol (XOPENEX) 1.25 MG/3ML nebulizer solution    Sig: Take 1.25 mg by nebulization every 4 (four) hours as needed for wheezing or shortness of breath.    Dispense:  216 mL    Refill:  1  . albuterol (VENTOLIN  HFA) 108 (90 Base) MCG/ACT inhaler    Sig: Inhale 2 puffs into the lungs every 4 (four) hours as needed for wheezing or shortness of breath (coughing).    Dispense:  54 g    Refill:  1   Diagnostics: None.  Medication List:  Current Outpatient Medications  Medication Sig Dispense Refill  . azelastine (ASTELIN) 0.1 % nasal spray azelastine 137 mcg (0.1 %) nasal spray aerosol    . budesonide (PULMICORT) 0.5 MG/2ML nebulizer solution Take 2 mLs (0.5 mg total) by nebulization 2 (two) times daily. 360 mL 1  . cetirizine (ZYRTEC) 10 MG tablet Take 10 mg by mouth at bedtime.    Marland Kitchen. FASENRA 30 MG/ML SOSY INJECT 30 MG UNDER THE SKIN AT WEEKS 0, 4, AND 8 FOLLOWED BY EVERY 8 WEEKS THEREAFTER 1 mL 12  . fluticasone  (FLONASE) 50 MCG/ACT nasal spray Use 1-2 sprays per nostril daily 16 g 2  . levalbuterol (XOPENEX) 1.25 MG/3ML nebulizer solution Take 1.25 mg by nebulization every 4 (four) hours as needed for wheezing or shortness of breath. 216 mL 1  . mometasone-formoterol (DULERA) 200-5 MCG/ACT AERO Inhale 2 puffs into the lungs 2 (two) times daily. 39 g 2  . montelukast (SINGULAIR) 10 MG tablet Take 1 tablet (10 mg total) by mouth at bedtime. 30 tablet 5  . Multiple Vitamin (MULTIVITAMIN WITH MINERALS) TABS tablet Take 1 tablet by mouth daily. Women's One A Day    . omeprazole (PRILOSEC) 40 MG capsule Take 1 capsule (40 mg total) by mouth daily. 90 capsule 0  . sodium chloride (OCEAN) 0.65 % nasal spray Place into the nose.    . Tiotropium Bromide Monohydrate (SPIRIVA RESPIMAT) 1.25 MCG/ACT AERS Inhale 2 puffs into the lungs daily. 12 g 1  . albuterol (VENTOLIN HFA) 108 (90 Base) MCG/ACT inhaler Inhale 2 puffs into the lungs every 4 (four) hours as needed for wheezing or shortness of breath (coughing). 54 g 1  . benzonatate (TESSALON) 100 MG capsule Take 1 capsule (100 mg total) by mouth every 8 (eight) hours. (Patient not taking: Reported on 11/29/2019) 21 capsule 0  . famotidine (PEPCID) 20 MG tablet Take 1 tablet (20 mg total) by mouth at bedtime. (Patient not taking: Reported on 11/29/2019) 30 tablet 3   No current facility-administered medications for this visit.   Allergies: Allergies  Allergen Reactions  . Aspirin Hives  . Levaquin [Levofloxacin In D5w] Hives  . Pamprin [Apap-Pamabrom-Pyrilamine] Hives   I reviewed her past medical history, social history, family history, and environmental history and no significant changes have been reported from her previous visit.  Review of Systems  Constitutional: Negative for appetite change, chills, fever and unexpected weight change.  HENT: Positive for congestion and sneezing. Negative for postnasal drip and rhinorrhea.   Eyes: Negative for itching.   Respiratory: Positive for cough, chest tightness, shortness of breath and wheezing.   Cardiovascular: Negative for chest pain.  Gastrointestinal: Negative for abdominal pain.  Genitourinary: Negative for difficulty urinating.  Skin: Negative for rash.  Allergic/Immunologic: Positive for environmental allergies. Negative for food allergies.  Neurological: Negative for headaches.   Objective: Physical Exam  Patient was speaking in full sentences without any respiratory distress. Minimal coughing noted and no wheezing heard.  Pulse ox 96% Not obtained as encounter was done via telephone.   Previous notes and tests were reviewed.  I discussed the assessment and treatment plan with the patient. The patient was provided an opportunity to ask questions and all were  answered. The patient agreed with the plan and demonstrated an understanding of the instructions. After visit summary/patient instructions available via mychart.   The patient was advised to call back or seek an in-person evaluation if the symptoms worsen or if the condition fails to improve as anticipated.  I provided 30 minutes of non-face-to-face time during this encounter.  It was my pleasure to participate in [place]'s care today. Please feel free to contact me with any questions or concerns.   Sincerely,  Wyline Mood, DO Allergy & Immunology  [facility] office: [phone] [facility] office: [phone] [place] office: [phone]

## 2019-11-29 NOTE — Patient Instructions (Addendum)
Not well controlled severe persistent asthma  Start albuterol nebulizer twice a day for 1 week and then use as needed.  Start budesonide 0.5mg  nebulizer twice a day until you see me in 1 month.   If not doing better then let us know, may need a course of prednisone.   Daily controller medication(s):Continue Dulera 200 2 puffs twice a day with spacer rinse mouth afterwards.  ? ContinueSpiriva1.63mcg2 puffs daily.  ? Continue Singulair 10mg  daily.  ? Continue Fasenra injections every 8 weeks for 2 more injections and then we will reassess.   Prior to physical activity:May use albuterol rescue inhaler 2 puffs 5 to 15 minutes prior to strenuous physical activities.  Rescue medications:May use albuterol rescue inhaler 2 puffs or nebulizer every 4 to 6 hours as needed for shortness of breath, chest tightness, coughing, and wheezing. Monitor frequency of use.  During upper respiratory infections: Start Pulmicort 0.5mg  nebulizer 2 times a day for 1-2 weeks during asthma flares.  Asthma control goals:  Full participation in all desired activities (may need albuterol before activity) Albuterol use two times or less a week on average (not counting use with activity) Cough interfering with sleep two times or less a month Oral steroids no more than once a year No hospitalizations   COVID-19  If have acute worsening of symptoms please go to ER/urgent care for further evaluation.  If you are able to, check pulse oximetry and if below 94-92% please go to ER.  The following supplements may help:   Vitamin C 500mg  twice a day and Quercetin 250-500 mg twice a day  Vitamin D3 2000 - 4000 u/day  B Complex vitamins  Zinc 75-100 mg/day  Melatonin 6-10 mg at night (the optimal dose is unknown)  Seasonal and perennial allergic rhinoconjunctivitis 2020 skin testing was positive to: grass, tree, cat, dog, horse, mouse, ragweed, weed, mold.  Continue environmental control measures  especially regarding the cat dander.   Take Zyrtec (cetirizine) 10mg  daily.  Continue Flonase 1-2 sprays daily.  History of frequent upper respiratory infection  Keep track of infections.  Get flu vaccine when feeling better.  Will recheck basic immune bloodwork at next visit.   Gastroesophageal reflux disease  Continue Pepcid 20mg  in the evening.  Continue omeprazole 40mg  in the morning.  Follow up in 4 weeks or sooner if needed.  Sincerely,  Rexene Alberts, DO Allergy & Immunology  [facility] office: [phone] [facility] office: [place] office: [phone]

## 2019-11-29 NOTE — Assessment & Plan Note (Signed)
Past history - History of frequent upper respiratory infections including sinusitis, pneumonia and ear infections. 2020 normal immunoglobulin levels and good pneumococcal and tetanus/diptheria titers.  Interim history - one infection but not requiring antibiotics.   Keep track of infections.  Get flu vaccine when feeling better.  Will recheck basic immune bloodwork at next visit.

## 2019-11-29 NOTE — Assessment & Plan Note (Signed)
Diagnosed on 11/17 and doing slowly better.  If have acute worsening of symptoms please go to ER/urgent care for further evaluation.  If you are able to, check pulse oximetry and if below 94-92% please go to ER.  The following supplements may help:   Vitamin C 500mg  twice a day and Quercetin 250-500 mg twice a day  Vitamin D3 2000 - 4000 u/day  B Complex vitamins  Zinc 75-100 mg/day  Melatonin 6-10 mg at night (the optimal dose is unknown)

## 2019-11-29 NOTE — Assessment & Plan Note (Addendum)
Reaction to aspirin in the form of hives as a child.  Continue to avoid aspirin, Levaquin and Pamprin.   Consider drug challenge once asthma is better controlled.  

## 2019-12-06 ENCOUNTER — Other Ambulatory Visit: Payer: Self-pay

## 2019-12-06 ENCOUNTER — Ambulatory Visit (INDEPENDENT_AMBULATORY_CARE_PROVIDER_SITE_OTHER): Payer: Managed Care, Other (non HMO)

## 2019-12-06 DIAGNOSIS — J4550 Severe persistent asthma, uncomplicated: Secondary | ICD-10-CM

## 2019-12-06 MED ORDER — BENRALIZUMAB 30 MG/ML ~~LOC~~ SOSY
30.0000 mg | PREFILLED_SYRINGE | SUBCUTANEOUS | Status: AC
  Administered 2019-12-06 – 2021-04-30 (×9): 30 mg via SUBCUTANEOUS

## 2019-12-25 ENCOUNTER — Encounter: Payer: Self-pay | Admitting: Osteopathic Medicine

## 2019-12-26 MED ORDER — ALBUTEROL SULFATE HFA 108 (90 BASE) MCG/ACT IN AERS: 2.0000 | INHALATION_SPRAY | RESPIRATORY_TRACT | 0 refills | Status: DC | PRN

## 2019-12-31 ENCOUNTER — Ambulatory Visit: Payer: Managed Care, Other (non HMO) | Admitting: Allergy

## 2019-12-31 NOTE — Progress Notes (Deleted)
Follow Up Note  RE: [name] MRN: [id number] DOB: 1981/07/31 Date of Office Visit: 12/31/2019  Referring provider: Sunnie Nielsen, DO Primary care provider: Sunnie Nielsen, DO  Chief Complaint: No chief complaint on file.  History of Present Illness: I had the pleasure of seeing [name] for a follow up visit at the [facility] on 12/31/2019. She is a 39 y.o. female, who is being followed for asthma, recent COVID-19 infection, allergic rhinoconjunctivitis, history of frequent upper respiratory infections, GERD and multiple drug allergies. Her previous allergy office visit was on 11/29/2019 with Dr. Selena Batten via telemedicine. Today is a regular follow up visit.  Not well controlled severe persistent asthma Past history - Patient was diagnosed with asthma over 30 years ago.  She was doing well up until 2012 when she got really ill and was hospitalized.  Recently started on Dulera 200 2 puffs twice a day which has been helping better than the Symbicort.  Still using albuterol rescue inhaler on a daily basis and had multiple courses of prednisone the last year with 2 ER visits and at least 6 urgent care visits as well.  Chest x-ray in December 2019 showed mild peribronchial thickening which may relate to chronic bronchitis or asthma.  Patient was followed by pulmonology in the past. Interim history - Patient was doing better until COVID-19 diagnosis in November. Still having coughing, wheezing and shortness of breath but slowly feeling better. Went to ER and had steroid injection.   Start albuterol nebulizer twice a day for 1 week and then use as needed.  Start budesonide 0.5mg  nebulizer twice a day until you see me in 1 month.   If not doing better then let us know, may need a course of prednisone.   Daily controller medication(s):Continue Dulera 200 2 puffs twice a day with spacer rinse mouth afterwards.  ? ContinueSpiriva1.1mcg2 puffs daily.  ? Continue  Singulair 10mg  daily.  ? Continue Fasenra injections every 8 weeksfor 2 more injections and then we will reassess.  Prior to physical activity:May use albuterol rescue inhaler 2 puffs 5 to 15 minutes prior to strenuous physical activities.  Rescue medications:May use albuterol rescue inhaler 2 puffs or nebulizer every 4 to 6 hours as needed for shortness of breath, chest tightness, coughing, and wheezing. Monitor frequency of use.  During upper respiratory infections: StartPulmicort 0.5mg  nebulizer 2 times a day for 1-2 weeks during asthma flares.  Repeat spirometry at next visit.   COVID-19 virus infection Diagnosed on 11/17 and doing slowly better.  If have acute worsening of symptoms please go to ER/urgent care for further evaluation.  If you are able to, check pulse oximetry and if below 94-92% please go to ER.  The following supplements may help:  ? Vitamin C 500mg  twice a day and Quercetin 250-500 mg twice a day ? Vitamin D3 2000 - 4000 u/day ? B Complex vitamins ? Zinc 75-100 mg/day ? Melatonin 6-10 mg at night (the optimal dose is unknown)  Seasonal and perennial allergic rhinoconjunctivitis Past history - Perennial rhinoconjunctivitis symptoms with worsening in the fall and winter for 30+ years.  No recent ENT evaluation. 2020 skin testing was positive to: grass, tree, cat, dog, horse, mouse, ragweed, weed, mold. Interim history - improved since not sleeping with cat.   Continue environmental control measuresespecially regarding the cat dander.  Take Zyrtec (cetirizine) 10mg  daily.  Continue Flonase 1-2 sprays daily.  Will start allergy immunotherapy once asthma is in better control  and work schedule allows weekly injections.   History of frequent upper respiratory infection Past history - History of frequent upper respiratory infections including sinusitis, pneumonia and ear infections. 2020 normal immunoglobulin levels and good pneumococcal and  tetanus/diptheria titers.  Interim history - one infection but not requiring antibiotics.   Keep track of infections.  Get flu vaccine when feeling better.  Will recheck basic immune bloodwork at next visit.   Gastroesophageal reflux disease Stable.   Continue Pepcid 20mg  in the evening.  Continue omeprazole 40mg  in the morning.  Multiple drug allergies Reaction to aspirin in the form of hives as a child.  Continue to avoid aspirin, Levaquin and Pamprin.   Consider drug challenge once asthma is better controlled.   Return in about 4 weeks (around 12/27/2019).  Assessment and Plan: [name] is a 39 y.o. female with: No problem-specific Assessment & Plan notes found for this encounter.  No follow-ups on file.  No orders of the defined types were placed in this encounter.  Lab Orders  No laboratory test(s) ordered today    Diagnostics: Spirometry:  Tracings reviewed. Her effort: {Blank single:19197::"Good reproducible efforts.","It was hard to get consistent efforts and there is a question as to whether this reflects a maximal maneuver.","Poor effort, data can not be interpreted."} FVC: ***L FEV1: ***L, ***% predicted FEV1/FVC ratio: ***% Interpretation: {Blank single:19197::"Spirometry consistent with mild obstructive disease","Spirometry consistent with moderate obstructive disease","Spirometry consistent with severe obstructive disease","Spirometry consistent with possible restrictive disease","Spirometry consistent with mixed obstructive and restrictive disease","Spirometry uninterpretable due to technique","Spirometry consistent with normal pattern","No overt abnormalities noted given today's efforts"}.  Please see scanned spirometry results for details.  Skin Testing: {Blank single:19197::"Select foods","Environmental allergy panel","Environmental allergy panel and select foods","Food allergy panel","None","Deferred due to recent antihistamines use"}. Positive test  to: ***. Negative test to: ***.  Results discussed with patient/family.   Medication List:  Current Outpatient Medications  Medication Sig Dispense Refill  . albuterol (VENTOLIN HFA) 108 (90 Base) MCG/ACT inhaler Inhale 2 puffs into the lungs every 4 (four) hours as needed for wheezing or shortness of breath (coughing). 54 g 0  . azelastine (ASTELIN) 0.1 % nasal spray azelastine 137 mcg (0.1 %) nasal spray aerosol    . benzonatate (TESSALON) 100 MG capsule Take 1 capsule (100 mg total) by mouth every 8 (eight) hours. (Patient not taking: Reported on 11/29/2019) 21 capsule 0  . budesonide (PULMICORT) 0.5 MG/2ML nebulizer solution Take 2 mLs (0.5 mg total) by nebulization 2 (two) times daily. 360 mL 1  . cetirizine (ZYRTEC) 10 MG tablet Take 10 mg by mouth at bedtime.    . famotidine (PEPCID) 20 MG tablet Take 1 tablet (20 mg total) by mouth at bedtime. (Patient not taking: Reported on 11/29/2019) 30 tablet 3  . FASENRA 30 MG/ML SOSY INJECT 30 MG UNDER THE SKIN AT WEEKS 0, 4, AND 8 FOLLOWED BY EVERY 8 WEEKS THEREAFTER 1 mL 12  . fluticasone (FLONASE) 50 MCG/ACT nasal spray Use 1-2 sprays per nostril daily 16 g 2  . levalbuterol (XOPENEX) 1.25 MG/3ML nebulizer solution Take 1.25 mg by nebulization every 4 (four) hours as needed for wheezing or shortness of breath. 216 mL 1  . mometasone-formoterol (DULERA) 200-5 MCG/ACT AERO Inhale 2 puffs into the lungs 2 (two) times daily. 39 g 2  . montelukast (SINGULAIR) 10 MG tablet Take 1 tablet (10 mg total) by mouth at bedtime. 30 tablet 5  . Multiple Vitamin (MULTIVITAMIN WITH MINERALS) TABS tablet Take 1 tablet by mouth daily. Women's  One A Day    . omeprazole (PRILOSEC) 40 MG capsule Take 1 capsule (40 mg total) by mouth daily. 90 capsule 0  . sodium chloride (OCEAN) 0.65 % nasal spray Place into the nose.    . Tiotropium Bromide Monohydrate (SPIRIVA RESPIMAT) 1.25 MCG/ACT AERS Inhale 2 puffs into the lungs daily. 12 g 1   Current Facility-Administered  Medications  Medication Dose Route Frequency Provider Last Rate Last Admin  . Benralizumab SOSY 30 mg  30 mg Subcutaneous Q8 Weeks Kennith Gain, MD   30 mg at 12/06/19 8676   Allergies: Allergies  Allergen Reactions  . Aspirin Hives  . Levaquin [Levofloxacin In D5w] Hives  . Pamprin [Apap-Pamabrom-Pyrilamine] Hives   I reviewed her past medical history, social history, family history, and environmental history and no significant changes have been reported from her previous visit.  Review of Systems  Constitutional: Negative for appetite change, chills, fever and unexpected weight change.  HENT: Positive for congestion and sneezing. Negative for postnasal drip and rhinorrhea.   Eyes: Negative for itching.  Respiratory: Positive for cough, chest tightness, shortness of breath and wheezing.   Cardiovascular: Negative for chest pain.  Gastrointestinal: Negative for abdominal pain.  Genitourinary: Negative for difficulty urinating.  Skin: Negative for rash.  Allergic/Immunologic: Positive for environmental allergies. Negative for food allergies.  Neurological: Negative for headaches.   Objective: There were no vitals taken for this visit. There is no height or weight on file to calculate BMI. Physical Exam  Constitutional: She is oriented to person, place, and time. She appears well-developed and well-nourished.  HENT:  Head: Normocephalic and atraumatic.  Right Ear: External ear normal.  Left Ear: External ear normal.  Nose: Nose normal.  Mouth/Throat: Oropharynx is clear and moist.  Eyes: Conjunctivae and EOM are normal.  Cardiovascular: Normal rate, regular rhythm and normal heart sounds. Exam reveals no gallop and no friction rub.  No murmur heard. Pulmonary/Chest: Effort normal and breath sounds normal. She has no wheezes. She has no rales.  Abdominal: Soft.  Musculoskeletal:     Cervical back: Neck supple.  Neurological: She is alert and oriented to person,  place, and time.  Skin: Skin is warm. No rash noted.  Psychiatric: She has a normal mood and affect. Her behavior is normal.  Nursing note and vitals reviewed.  Previous notes and tests were reviewed. The plan was reviewed with the patient/family, and all questions/concerned were addressed.  It was my pleasure to see Samaria today and participate in her care. Please feel free to contact me with any questions or concerns.  Sincerely,  Rexene Alberts, DO Allergy & Immunology  [facility] office: [phone] [facility] office: [place] office: [phone]

## 2020-01-18 ENCOUNTER — Telehealth: Payer: Self-pay | Admitting: *Deleted

## 2020-01-18 NOTE — Telephone Encounter (Signed)
L/m for patient to contact me regarding change in [facility] to [facility]

## 2020-01-28 ENCOUNTER — Other Ambulatory Visit: Payer: Self-pay | Admitting: *Deleted

## 2020-01-28 ENCOUNTER — Telehealth: Payer: Self-pay | Admitting: Allergy

## 2020-01-28 MED ORDER — MOMETASONE FURO-FORMOTEROL FUM 200-5 MCG/ACT IN AERO: 2.0000 | INHALATION_SPRAY | Freq: Two times a day (BID) | RESPIRATORY_TRACT | 1 refills | Status: DC

## 2020-01-28 NOTE — Telephone Encounter (Signed)
Please call patient.  I received a note from her [facility] that some of her inhalers won't be covered. Ask which inhalers she is using now. Where does she want me to send in her new inhalers.   dulera will no longer be covered - alt advair, Breo and symbicort.   She is also due for OV.  Thank you.

## 2020-01-28 NOTE — Telephone Encounter (Signed)
Called patient and she stated that she is taking Dulera and to try sending her medication to [facility] to see if it will be a different response. Advised patient that would send it in and see if there is a different response and will let patient know. Patient verbalized understanding. When mentioned about an OV needed for a follow up the line became disconnected.

## 2020-01-31 ENCOUNTER — Ambulatory Visit: Payer: Self-pay

## 2020-02-07 ENCOUNTER — Ambulatory Visit: Payer: Self-pay

## 2020-02-14 ENCOUNTER — Other Ambulatory Visit: Payer: Self-pay

## 2020-02-14 ENCOUNTER — Ambulatory Visit (INDEPENDENT_AMBULATORY_CARE_PROVIDER_SITE_OTHER): Payer: Managed Care, Other (non HMO)

## 2020-02-14 DIAGNOSIS — J4550 Severe persistent asthma, uncomplicated: Secondary | ICD-10-CM | POA: Diagnosis not present

## 2020-02-27 ENCOUNTER — Telehealth: Payer: Self-pay | Admitting: Allergy

## 2020-02-27 MED ORDER — MONTELUKAST SODIUM 10 MG PO TABS: 10.0000 mg | ORAL_TABLET | Freq: Every day | ORAL | 1 refills | Status: DC

## 2020-02-27 MED ORDER — FLUTICASONE PROPIONATE 50 MCG/ACT NA SUSP: NASAL | 1 refills | Status: DC

## 2020-02-27 MED ORDER — OMEPRAZOLE 40 MG PO CPDR: 40.0000 mg | DELAYED_RELEASE_CAPSULE | Freq: Every day | ORAL | 1 refills | Status: DC

## 2020-02-27 NOTE — Telephone Encounter (Signed)
Patient called and said that she need you to mail her refills on her meds for 3 months to her,so she can [facility] it [facility] [phone].

## 2020-02-27 NOTE — Telephone Encounter (Signed)
Refills sent to [facility] per patient's request!

## 2020-02-28 ENCOUNTER — Other Ambulatory Visit: Payer: Self-pay | Admitting: Allergy

## 2020-03-11 ENCOUNTER — Encounter: Payer: Self-pay | Admitting: Osteopathic Medicine

## 2020-04-10 ENCOUNTER — Ambulatory Visit (INDEPENDENT_AMBULATORY_CARE_PROVIDER_SITE_OTHER): Payer: Managed Care, Other (non HMO)

## 2020-04-10 ENCOUNTER — Other Ambulatory Visit: Payer: Self-pay

## 2020-04-10 DIAGNOSIS — J4550 Severe persistent asthma, uncomplicated: Secondary | ICD-10-CM | POA: Diagnosis not present

## 2020-04-29 ENCOUNTER — Other Ambulatory Visit: Payer: Self-pay | Admitting: Allergy

## 2020-04-29 NOTE — Telephone Encounter (Signed)
Please advise 

## 2020-05-06 ENCOUNTER — Telehealth: Payer: Self-pay

## 2020-05-06 NOTE — Telephone Encounter (Signed)
Received a prescription renewal request from [facility] for Ventolin INH HFA 200 18.00 However in the patients last note Ventolin 108 (90) was prescribed. Dr. Selena Batten will see and sign if she agrees to the request.

## 2020-05-08 ENCOUNTER — Telehealth: Payer: Self-pay

## 2020-05-08 MED ORDER — ALBUTEROL SULFATE HFA 108 (90 BASE) MCG/ACT IN AERS: 2.0000 | INHALATION_SPRAY | RESPIRATORY_TRACT | 0 refills | Status: DC | PRN

## 2020-05-08 NOTE — Telephone Encounter (Signed)
Received a prescription renewal for Ventolin via fax. Sending in a refill for her Ventolin.

## 2020-05-23 ENCOUNTER — Other Ambulatory Visit: Payer: Self-pay

## 2020-05-23 MED ORDER — MOMETASONE FURO-FORMOTEROL FUM 200-5 MCG/ACT IN AERO: 2.0000 | INHALATION_SPRAY | Freq: Two times a day (BID) | RESPIRATORY_TRACT | 5 refills | Status: DC

## 2020-05-29 ENCOUNTER — Telehealth: Payer: Self-pay

## 2020-05-29 MED ORDER — SPIRIVA RESPIMAT 1.25 MCG/ACT IN AERS: 2.0000 | INHALATION_SPRAY | Freq: Every day | RESPIRATORY_TRACT | 0 refills | Status: DC

## 2020-05-29 NOTE — Telephone Encounter (Signed)
Patient is requesting a refill on Spiriva 1.25. Her next appointment is 06/18/20 I'm sending in a courtesy refill until her next appointment.

## 2020-06-05 ENCOUNTER — Other Ambulatory Visit: Payer: Self-pay

## 2020-06-05 ENCOUNTER — Ambulatory Visit (INDEPENDENT_AMBULATORY_CARE_PROVIDER_SITE_OTHER): Payer: Managed Care, Other (non HMO)

## 2020-06-05 DIAGNOSIS — J4550 Severe persistent asthma, uncomplicated: Secondary | ICD-10-CM | POA: Diagnosis not present

## 2020-06-11 ENCOUNTER — Ambulatory Visit: Payer: Managed Care, Other (non HMO) | Admitting: Allergy

## 2020-06-18 ENCOUNTER — Ambulatory Visit: Payer: Managed Care, Other (non HMO) | Admitting: Allergy

## 2020-06-24 ENCOUNTER — Telehealth: Payer: Self-pay | Admitting: Allergy

## 2020-06-24 MED ORDER — ALBUTEROL SULFATE HFA 108 (90 BASE) MCG/ACT IN AERS: 2.0000 | INHALATION_SPRAY | RESPIRATORY_TRACT | 0 refills | Status: DC | PRN

## 2020-06-24 NOTE — Telephone Encounter (Signed)
Patient called and is out of town and needs her ventolin inhaler called into [place] pharmacy. [phone].

## 2020-06-24 NOTE — Telephone Encounter (Signed)
Spoke with patient, informed her that a 30 day courtesy refill has been sent to the requested pharmacy. I informed patient to keep her scheduled appointment for more refills/. Patient verbalized understanding.

## 2020-07-31 ENCOUNTER — Ambulatory Visit (INDEPENDENT_AMBULATORY_CARE_PROVIDER_SITE_OTHER): Payer: Managed Care, Other (non HMO)

## 2020-07-31 ENCOUNTER — Other Ambulatory Visit: Payer: Self-pay

## 2020-07-31 DIAGNOSIS — J4550 Severe persistent asthma, uncomplicated: Secondary | ICD-10-CM

## 2020-08-05 NOTE — Progress Notes (Signed)
Follow Up Note  RE: [name] MRN: [id number] DOB: Nov 05, 1981 Date of Office Visit: 08/06/2020  Referring provider: Sunnie Nielsen, DO Primary care provider: Sunnie Nielsen, DO  Chief Complaint: Follow-up  History of Present Illness: I had the pleasure of seeing [name] for a follow up visit at the [facility] on 08/06/2020. She is a 39 y.o. female, who is being followed for asthma on Fasenra injections, allergic rhinoconjunctivitis, history of frequent upper respiratory infections, GERD and multiple drug allergies. Her previous allergy office visit was on 11/29/2019 with Dr. Selena Batten via telemedicine. Today is a regular follow up visit. Up to date with COVID-19 vaccine: no  Asthma: Patient has been receiving the Fasenra injections at our office and noticing improvement while on it. Currently on Dulera 2 puffs twice a day and Spiriva 2 puffs once a day and Singulair daily.  Humidity tends to cause some chest tightness and using albuterol 1-2 times per day especially at night.  No ER/urgent care visits or prednisone use.   Patient had COVID-19 in November 2020  Seasonal and perennial allergic rhinoconjunctivitis Takes zyrtec 10mg  daily. Doesn't think the Flonase helped that much. Has not tried any other antihistamines recently.   History of frequent upper respiratory infection Had 1 sinus infection about 1 month ago and treated with antibiotics.   Gastroesophageal reflux disease Taking omeprazole 40mg  in the morning. Soda was triggering reflux and since she cut it out she has been able to stop her Pepcid.   Assessment and Plan: [name] is a 39 y.o. female with: Not well controlled severe persistent asthma Past history - Patient was diagnosed with asthma over 30 years ago.  She was doing well up until 2012 when she got really ill and was hospitalized.  Recently started on Dulera 200 2 puffs twice a day which has been helping better than the  Symbicort.  Still using albuterol rescue inhaler on a daily basis and had multiple courses of prednisone the last year with 2 ER visits and at least 6 urgent care visits as well.  Chest x-ray in December 2019 showed mild peribronchial thickening which may relate to chronic bronchitis or asthma.  Patient was followed by pulmonology in the past. COVID-19 in November 2020. Interim history - January 2020 is helping but the warm weather and humidity is causing some chest tightness. Still using albuterol 1-2 times per day especially at night. No oral prednisone, ER/urgent care visits.  Daily controller medication(s):Continue Dulera December 2020 2 puffs twice a day with spacer rinse mouth afterwards.  ? ContinueSpiriva1.47mcg2 puffs daily.  ? Continue Singulair 10mg  daily.  ? Continue Fasenra injections every 8 weeks.  ? START Alvesco 1 puff once a day during the warmer months and see if it helps your breathing. Sample given. Rinse mouth after each use.   Prior to physical activity:May use albuterol rescue inhaler 2 puffs 5 to 15 minutes prior to strenuous physical activities.  Rescue medications:May use albuterol rescue inhaler 2 puffs or nebulizer every 4 to 6 hours as needed for shortness of breath, chest tightness, coughing, and wheezing. Monitor frequency of use.  During upper respiratory infections: Start Pulmicort 0.5mg  nebulizer 2 times a day for 1-2 weeks during asthma flares.   Repeat spirometry at next visit.  Seasonal and perennial allergic rhinoconjunctivitis Past history - Perennial rhinoconjunctivitis symptoms with worsening in the fall and winter for 30+ years.  No recent ENT evaluation. 2020 skin testing was positive to: grass, tree, cat,  dog, horse, mouse, ragweed, weed, mold. Interim history - Flonase does not help.  Continue environmental control measures especially regarding the cat dander.   May use over the counter antihistamines such as Zyrtec (cetirizine), Claritin  (loratadine), Allegra (fexofenadine), or Xyzal (levocetirizine) daily as needed. May take twice a day if needed.  May use Flonase (fluticasone) nasal spray 1 spray per nostril twice a day as needed for nasal congestion.   Nasal saline spray (i.e., Simply Saline) or nasal saline lavage (i.e., NeilMed) is recommended as needed and prior to medicated nasal sprays.  Consider allergy immunotherapy once asthma is in better control and work schedule allows weekly injections.   Gastroesophageal reflux disease Improved with eliminating soda from her diet.   Continue omeprazole 40mg  in the morning.  History of frequent upper respiratory infection Past history - History of frequent upper respiratory infections including sinusitis, pneumonia and ear infections. 2020 normal immunoglobulin levels and good pneumococcal and tetanus/diptheria titers.  Interim history - 1 sinus infection treated with antibiotics.   Keep track of infections.  Multiple drug allergies Reaction to aspirin in the form of hives as a child.  Continue to avoid aspirin, Levaquin and Pamprin.   Consider drug challenge once asthma is better controlled.   Vaccine counseling Discussed risks and benefits of COVID-19 vaccination.  Recommend getting the COVID-19 vaccine.   Return in about 3 months (around 11/06/2020).  Meds ordered this encounter  Medications  . albuterol (VENTOLIN HFA) 108 (90 Base) MCG/ACT inhaler    Sig: Inhale 2 puffs into the lungs every 4 (four) hours as needed for wheezing or shortness of breath (coughing).    Dispense:  18 g    Refill:  1  . mometasone-formoterol (DULERA) 200-5 MCG/ACT AERO    Sig: Inhale 2 puffs into the lungs 2 (two) times daily.    Dispense:  13 g    Refill:  5    Please keep on file for when the patient needs to pick up.  . montelukast (SINGULAIR) 10 MG tablet    Sig: Take 1 tablet (10 mg total) by mouth at bedtime.    Dispense:  30 tablet    Refill:  5    Please keep on  file for when the patient needs to pick up.  11/08/2020 omeprazole (PRILOSEC) 40 MG capsule    Sig: Take 1 capsule (40 mg total) by mouth daily.    Dispense:  30 capsule    Refill:  5  . Tiotropium Bromide Monohydrate (SPIRIVA RESPIMAT) 1.25 MCG/ACT AERS    Sig: Inhale 2 puffs into the lungs daily.    Dispense:  4 g    Refill:  5    Please keep on file for when the patient needs to pick up.   Diagnostics: Spirometry:  Tracings reviewed. Her effort: Good reproducible efforts. FVC: 2.89L FEV1: 2.24L, 68% predicted FEV1/FVC ratio: 78% Interpretation: Spirometry consistent with possible restrictive disease.  Please see scanned spirometry results for details.  Medication List:  Current Outpatient Medications  Medication Sig Dispense Refill  . albuterol (VENTOLIN HFA) 108 (90 Base) MCG/ACT inhaler Inhale 2 puffs into the lungs every 4 (four) hours as needed for wheezing or shortness of breath (coughing). 18 g 1  . budesonide (PULMICORT) 0.5 MG/2ML nebulizer solution Take 2 mLs (0.5 mg total) by nebulization 2 (two) times daily. 360 mL 1  . cetirizine (ZYRTEC) 10 MG tablet Take 10 mg by mouth at bedtime.    Marland Kitchen FASENRA 30 MG/ML SOSY INJECT 1  SYRINGE UNDER THE SKIN AT WEEK 8, FOLLOWED BY EVERY 8 WEEKS THEREAFTER 1 mL 6  . levalbuterol (XOPENEX) 1.25 MG/3ML nebulizer solution Take 1.25 mg by nebulization every 4 (four) hours as needed for wheezing or shortness of breath. 216 mL 1  . mometasone-formoterol (DULERA) 200-5 MCG/ACT AERO Inhale 2 puffs into the lungs 2 (two) times daily. 13 g 5  . montelukast (SINGULAIR) 10 MG tablet Take 1 tablet (10 mg total) by mouth at bedtime. 30 tablet 5  . Multiple Vitamin (MULTIVITAMIN WITH MINERALS) TABS tablet Take 1 tablet by mouth daily. Women's One A Day    . omeprazole (PRILOSEC) 40 MG capsule Take 1 capsule (40 mg total) by mouth daily. 30 capsule 5  . Tiotropium Bromide Monohydrate (SPIRIVA RESPIMAT) 1.25 MCG/ACT AERS Inhale 2 puffs into the lungs daily. 4 g 5    Current Facility-Administered Medications  Medication Dose Route Frequency Provider Last Rate Last Admin  . Benralizumab SOSY 30 mg  30 mg Subcutaneous Q8 Weeks Marcelyn Bruins, MD   30 mg at 07/31/20 6967   Allergies: Allergies  Allergen Reactions  . Aspirin Hives  . Levaquin [Levofloxacin In D5w] Hives  . Pamprin [Apap-Pamabrom-Pyrilamine] Hives   I reviewed her past medical history, social history, family history, and environmental history and no significant changes have been reported from her previous visit.  Review of Systems  Constitutional: Negative for appetite change, chills, fever and unexpected weight change.  HENT: Positive for congestion. Negative for postnasal drip and rhinorrhea.   Eyes: Negative for itching.  Respiratory: Positive for chest tightness. Negative for cough, shortness of breath and wheezing.   Cardiovascular: Negative for chest pain.  Gastrointestinal: Negative for abdominal pain.  Genitourinary: Negative for difficulty urinating.  Skin: Negative for rash.  Allergic/Immunologic: Positive for environmental allergies. Negative for food allergies.  Neurological: Negative for headaches.   Objective: BP 128/88 (BP Location: Left Arm, Patient Position: Sitting, Cuff Size: Large)   Pulse 95   Temp 98.2 F (36.8 C) (Temporal)   Resp 18   SpO2 95%  There is no height or weight on file to calculate BMI. Physical Exam Vitals and nursing note reviewed.  Constitutional:      Appearance: Normal appearance. She is well-developed.  HENT:     Head: Normocephalic and atraumatic.     Right Ear: Tympanic membrane and external ear normal.     Left Ear: Tympanic membrane and external ear normal.     Nose: Nose normal.     Mouth/Throat:     Mouth: Mucous membranes are moist.     Pharynx: Oropharynx is clear.  Eyes:     Conjunctiva/sclera: Conjunctivae normal.  Cardiovascular:     Rate and Rhythm: Normal rate and regular rhythm.     Heart sounds:  Normal heart sounds. No murmur heard.  No friction rub. No gallop.   Pulmonary:     Effort: Pulmonary effort is normal.     Breath sounds: Normal breath sounds. No wheezing or rales.  Musculoskeletal:     Cervical back: Neck supple.  Skin:    General: Skin is warm.     Findings: No rash.  Neurological:     Mental Status: She is alert and oriented to person, place, and time.  Psychiatric:        Behavior: Behavior normal.    Previous notes and tests were reviewed. The plan was reviewed with the patient/family, and all questions/concerned were addressed.  It was my pleasure to see Ilona  today and participate in her care. Please feel free to contact me with any questions or concerns.  Sincerely,  Rexene Alberts, DO Allergy & Immunology  [facility] office: [phone] [facility] office: [place] office: [phone]

## 2020-08-06 ENCOUNTER — Other Ambulatory Visit: Payer: Self-pay | Admitting: Allergy

## 2020-08-06 ENCOUNTER — Ambulatory Visit: Payer: Managed Care, Other (non HMO) | Admitting: Allergy

## 2020-08-06 ENCOUNTER — Other Ambulatory Visit: Payer: Self-pay

## 2020-08-06 ENCOUNTER — Encounter: Payer: Self-pay | Admitting: Allergy

## 2020-08-06 VITALS — BP 128/88 | HR 95 | Temp 98.2°F | Resp 18

## 2020-08-06 DIAGNOSIS — Z7185 Encounter for immunization safety counseling: Secondary | ICD-10-CM | POA: Insufficient documentation

## 2020-08-06 DIAGNOSIS — J4550 Severe persistent asthma, uncomplicated: Secondary | ICD-10-CM | POA: Diagnosis not present

## 2020-08-06 DIAGNOSIS — Z8709 Personal history of other diseases of the respiratory system: Secondary | ICD-10-CM

## 2020-08-06 DIAGNOSIS — Z889 Allergy status to unspecified drugs, medicaments and biological substances status: Secondary | ICD-10-CM

## 2020-08-06 DIAGNOSIS — Z7189 Other specified counseling: Secondary | ICD-10-CM

## 2020-08-06 DIAGNOSIS — J302 Other seasonal allergic rhinitis: Secondary | ICD-10-CM | POA: Diagnosis not present

## 2020-08-06 DIAGNOSIS — J3089 Other allergic rhinitis: Secondary | ICD-10-CM

## 2020-08-06 DIAGNOSIS — H1010 Acute atopic conjunctivitis, unspecified eye: Secondary | ICD-10-CM

## 2020-08-06 DIAGNOSIS — K219 Gastro-esophageal reflux disease without esophagitis: Secondary | ICD-10-CM

## 2020-08-06 MED ORDER — OMEPRAZOLE 40 MG PO CPDR: 40.0000 mg | DELAYED_RELEASE_CAPSULE | Freq: Every day | ORAL | 5 refills | Status: DC

## 2020-08-06 MED ORDER — MONTELUKAST SODIUM 10 MG PO TABS: 10.0000 mg | ORAL_TABLET | Freq: Every day | ORAL | 5 refills | Status: DC

## 2020-08-06 MED ORDER — ALBUTEROL SULFATE HFA 108 (90 BASE) MCG/ACT IN AERS: 2.0000 | INHALATION_SPRAY | RESPIRATORY_TRACT | 1 refills | Status: DC | PRN

## 2020-08-06 MED ORDER — MOMETASONE FURO-FORMOTEROL FUM 200-5 MCG/ACT IN AERO: 2.0000 | INHALATION_SPRAY | Freq: Two times a day (BID) | RESPIRATORY_TRACT | 5 refills | Status: DC

## 2020-08-06 MED ORDER — SPIRIVA RESPIMAT 1.25 MCG/ACT IN AERS: 2.0000 | INHALATION_SPRAY | Freq: Every day | RESPIRATORY_TRACT | 5 refills | Status: DC

## 2020-08-06 NOTE — Assessment & Plan Note (Signed)
Reaction to aspirin in the form of hives as a child.  Continue to avoid aspirin, Levaquin and Pamprin.   Consider drug challenge once asthma is better controlled.

## 2020-08-06 NOTE — Assessment & Plan Note (Signed)
Improved with eliminating soda from her diet.   Continue omeprazole 40mg  in the morning.

## 2020-08-06 NOTE — Patient Instructions (Addendum)
Asthma  Daily controller medication(s):Continue Dulera 2 puffs twice a day with spacer rinse mouth afterwards.  ? ContinueSpiriva1.8mcg2 puffs daily.  ? Continue Singulair 10mg  daily.  ? Continue Fasenra injections every 8 weeks. ? START Alvesco 1 puff once a day during the warmer months and see if it helps your breathing. Sample given. Rinse mouth after each use.   Prior to physical activity:May use albuterol rescue inhaler 2 puffs 5 to 15 minutes prior to strenuous physical activities.  Rescue medications:May use albuterol rescue inhaler 2 puffs or nebulizer every 4 to 6 hours as needed for shortness of breath, chest tightness, coughing, and wheezing. Monitor frequency of use.  During upper respiratory infections: Start Pulmicort 0.5mg  nebulizer 2 times a day for 1-2 weeks during asthma flares.  Asthma control goals:  Full participation in all desired activities (may need albuterol before activity) Albuterol use two times or less a week on average (not counting use with activity) Cough interfering with sleep two times or less a month Oral steroids no more than once a year No hospitalizations   Seasonal and perennial allergic rhinoconjunctivitis 2020 skin testing was positive to: grass, tree, cat, dog, horse, mouse, ragweed, weed, mold.  Continue environmental control measures especially regarding the cat dander.   May use over the counter antihistamines such as Zyrtec (cetirizine), Claritin (loratadine), Allegra (fexofenadine), or Xyzal (levocetirizine) daily as needed. May take twice a day if needed.  May use Flonase (fluticasone) nasal spray 1 spray per nostril twice a day as needed for nasal congestion.   Nasal saline spray (i.e., Simply Saline) or nasal saline lavage (i.e., NeilMed) is recommended as needed and prior to medicated nasal sprays.  History of frequent upper respiratory infection  Keep track of infections.  Gastroesophageal reflux  disease  Continue omeprazole 40mg  in the morning.  Follow up in 3 months or sooner if needed.  Recommend getting the COVID-19 vaccine - or . Here's more information: [employer]

## 2020-08-06 NOTE — Assessment & Plan Note (Signed)
Past history - Patient was diagnosed with asthma over 30 years ago.  She was doing well up until 2012 when she got really ill and was hospitalized.  Recently started on Dulera 200 2 puffs twice a day which has been helping better than the Symbicort.  Still using albuterol rescue inhaler on a daily basis and had multiple courses of prednisone the last year with 2 ER visits and at least 6 urgent care visits as well.  Chest x-ray in December 2019 showed mild peribronchial thickening which may relate to chronic bronchitis or asthma.  Patient was followed by pulmonology in the past. COVID-19 in November 2020. Interim history - Harrington Challenger is helping but the warm weather and humidity is causing some chest tightness. Still using albuterol 1-2 times per day especially at night. No oral prednisone, ER/urgent care visits.  Daily controller medication(s):Continue Dulera 2 puffs twice a day with spacer rinse mouth afterwards.  ? ContinueSpiriva1.51mcg2 puffs daily.  ? Continue Singulair 10mg  daily.  ? Continue Fasenra injections every 8 weeks.  ? START Alvesco 1 puff once a day during the warmer months and see if it helps your breathing. Sample given. Rinse mouth after each use.   Prior to physical activity:May use albuterol rescue inhaler 2 puffs 5 to 15 minutes prior to strenuous physical activities.  Rescue medications:May use albuterol rescue inhaler 2 puffs or nebulizer every 4 to 6 hours as needed for shortness of breath, chest tightness, coughing, and wheezing. Monitor frequency of use.  During upper respiratory infections: Start Pulmicort 0.5mg  nebulizer 2 times a day for 1-2 weeks during asthma flares.   Repeat spirometry at next visit.

## 2020-08-06 NOTE — Assessment & Plan Note (Signed)
Past history - History of frequent upper respiratory infections including sinusitis, pneumonia and ear infections. 2020 normal immunoglobulin levels and good pneumococcal and tetanus/diptheria titers.  Interim history - 1 sinus infection treated with antibiotics.   Keep track of infections.

## 2020-08-06 NOTE — Assessment & Plan Note (Signed)
>>  ASSESSMENT AND PLAN FOR HISTORY OF FREQUENT UPPER RESPIRATORY INFECTION WRITTEN ON 08/06/2020 12:51 PM BY Ellamae Sia, DO  Past history - History of frequent upper respiratory infections including sinusitis, pneumonia and ear infections. 2020 normal immunoglobulin levels and good pneumococcal and tetanus/diptheria titers.  Interim history - 1 sinus infection treated with antibiotics.   Keep track of infections.

## 2020-08-06 NOTE — Assessment & Plan Note (Signed)
Discussed risks and benefits of COVID-19 vaccination.  Recommend getting the COVID-19 vaccine.   

## 2020-08-06 NOTE — Assessment & Plan Note (Signed)
Past history - Perennial rhinoconjunctivitis symptoms with worsening in the fall and winter for 30+ years.  No recent ENT evaluation. 2020 skin testing was positive to: grass, tree, cat, dog, horse, mouse, ragweed, weed, mold. Interim history - Flonase does not help.  Continue environmental control measures especially regarding the cat dander.   May use over the counter antihistamines such as Zyrtec (cetirizine), Claritin (loratadine), Allegra (fexofenadine), or Xyzal (levocetirizine) daily as needed. May take twice a day if needed.  May use Flonase (fluticasone) nasal spray 1 spray per nostril twice a day as needed for nasal congestion.   Nasal saline spray (i.e., Simply Saline) or nasal saline lavage (i.e., NeilMed) is recommended as needed and prior to medicated nasal sprays.  Consider allergy immunotherapy once asthma is in better control and work schedule allows weekly injections.

## 2020-08-12 ENCOUNTER — Other Ambulatory Visit: Payer: Self-pay | Admitting: Allergy

## 2020-09-02 ENCOUNTER — Other Ambulatory Visit: Payer: Self-pay

## 2020-09-02 MED ORDER — BUDESONIDE 0.5 MG/2ML IN SUSP: 0.5000 mg | Freq: Two times a day (BID) | RESPIRATORY_TRACT | 1 refills | Status: DC

## 2020-09-04 IMAGING — CR DG CHEST 2V
2 series · 2 of 2 positions shown · non-contrast
Comparison: 01/04/2017

CLINICAL DATA: Pt has hx of asthma. Reports increased SOB since 4pm

EXAM:
CHEST - 2 VIEW

[w chest pa]
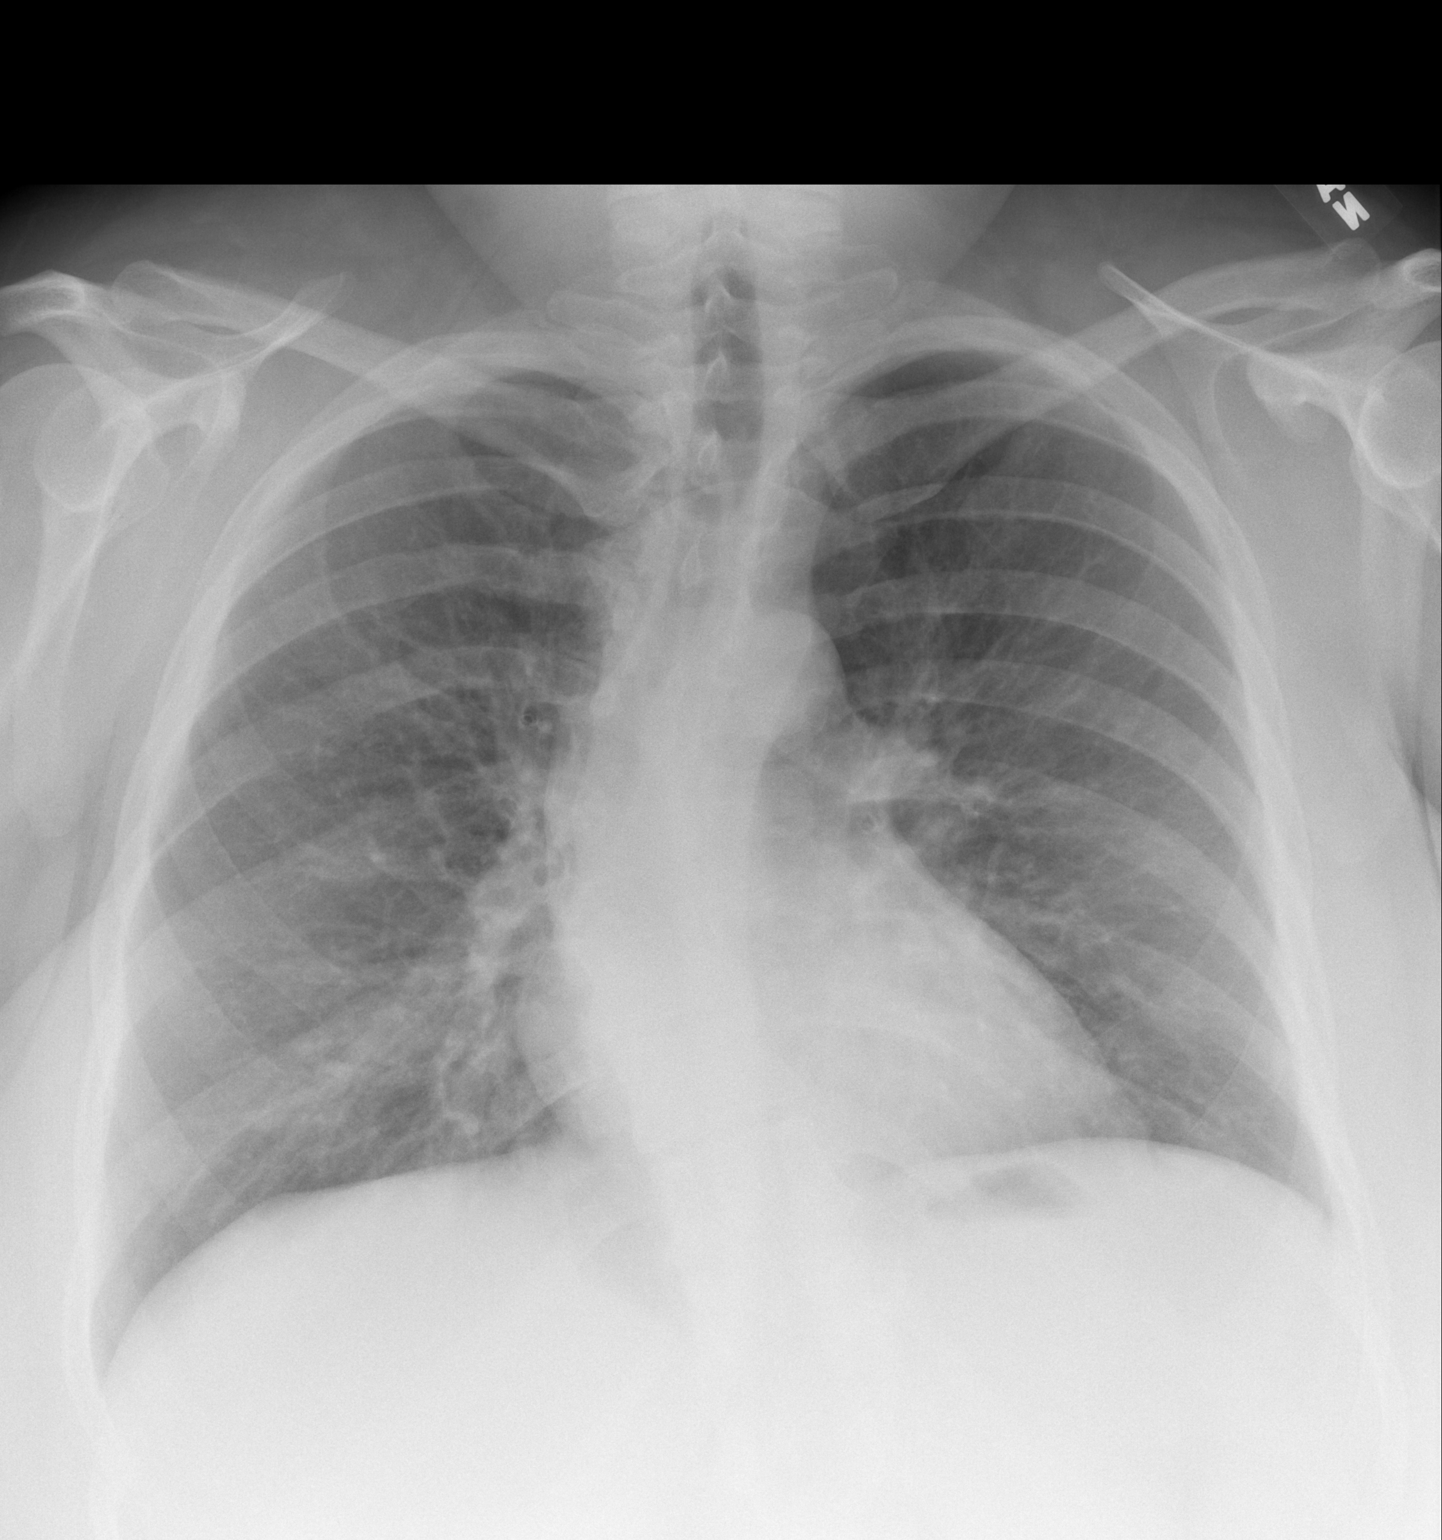

[w chest lat]
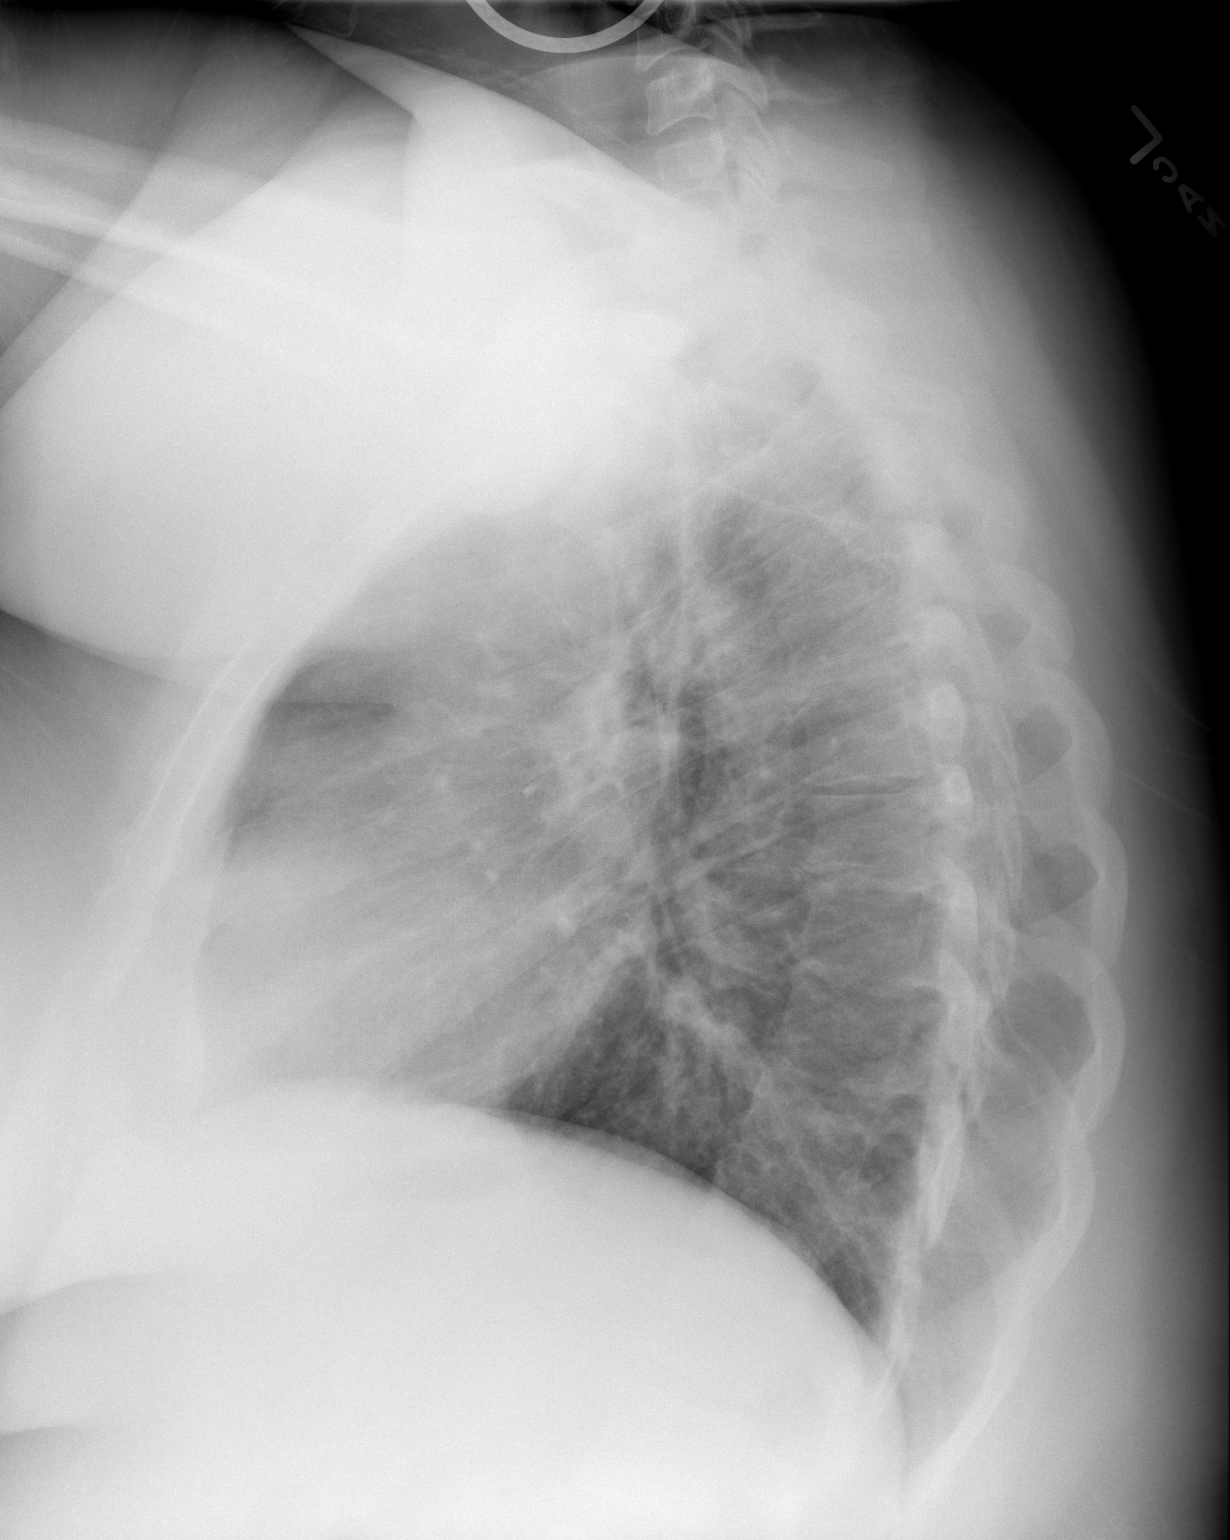

[2 of 2 positions shown; findings below may reference images not displayed]

FINDINGS: Both views are mildly degraded by patient body habitus. Mild convex
right thoracic spine curvature. Midline trachea. Normal heart size
and mediastinal contours. No pleural effusion or pneumothorax.
Diffuse peribronchial thickening. Clear lungs.
IMPRESSION: 1. No acute cardiopulmonary disease.
2. Mild peribronchial thickening which may relate to chronic
bronchitis or asthma.

## 2020-09-07 IMAGING — DX DG WRIST COMPLETE 3+V*L*
4 series · 4 of 4 positions shown · non-contrast
Comparison: None.

CLINICAL DATA: Status post fall, with left posterior wrist pain.
Initial encounter.

EXAM:
LEFT WRIST - COMPLETE 3+ VIEW

[wrist pa]
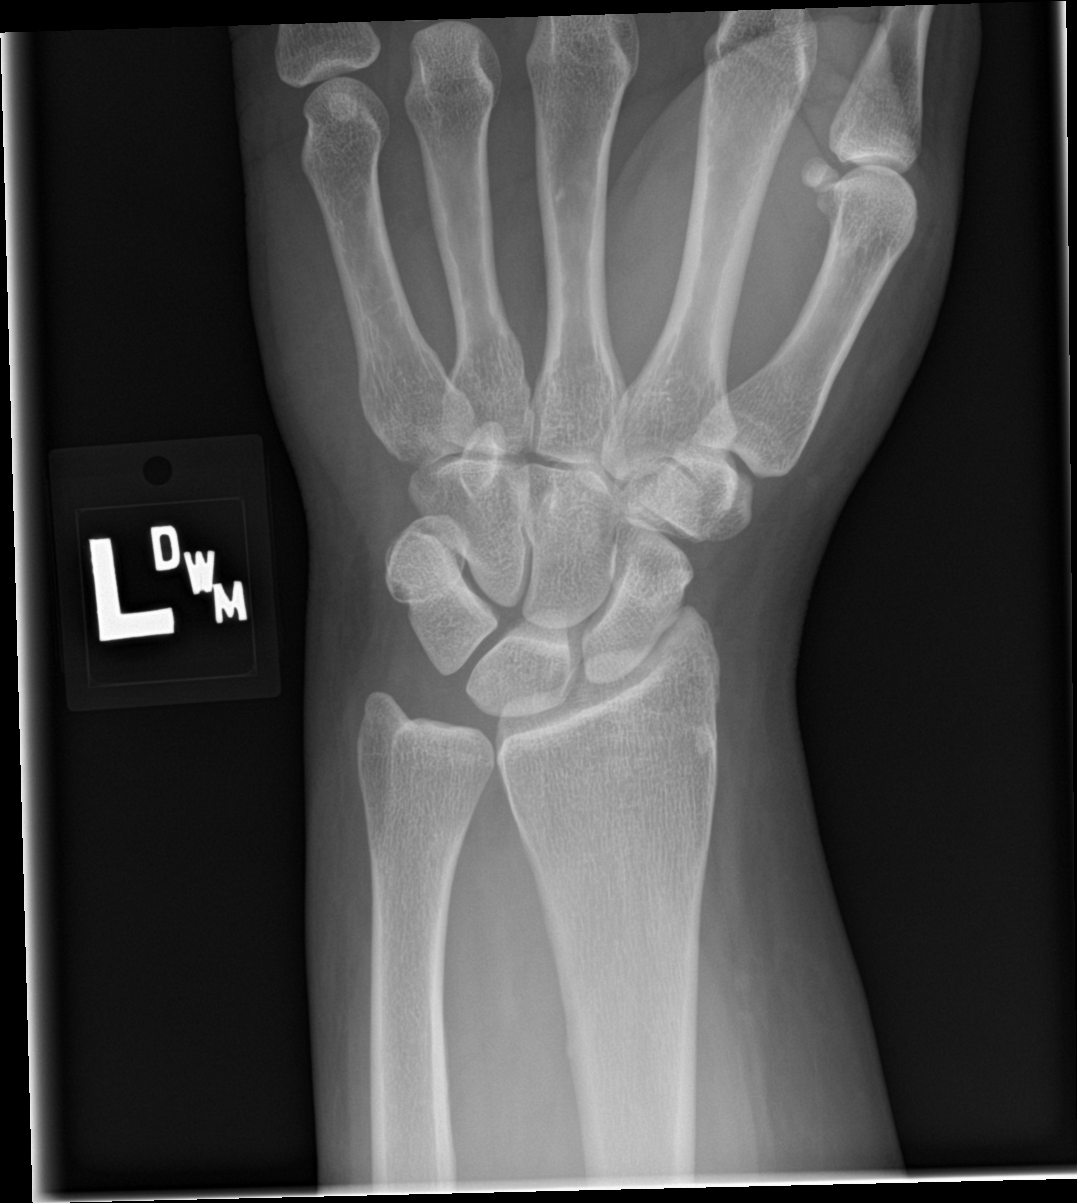

[wrist obl]
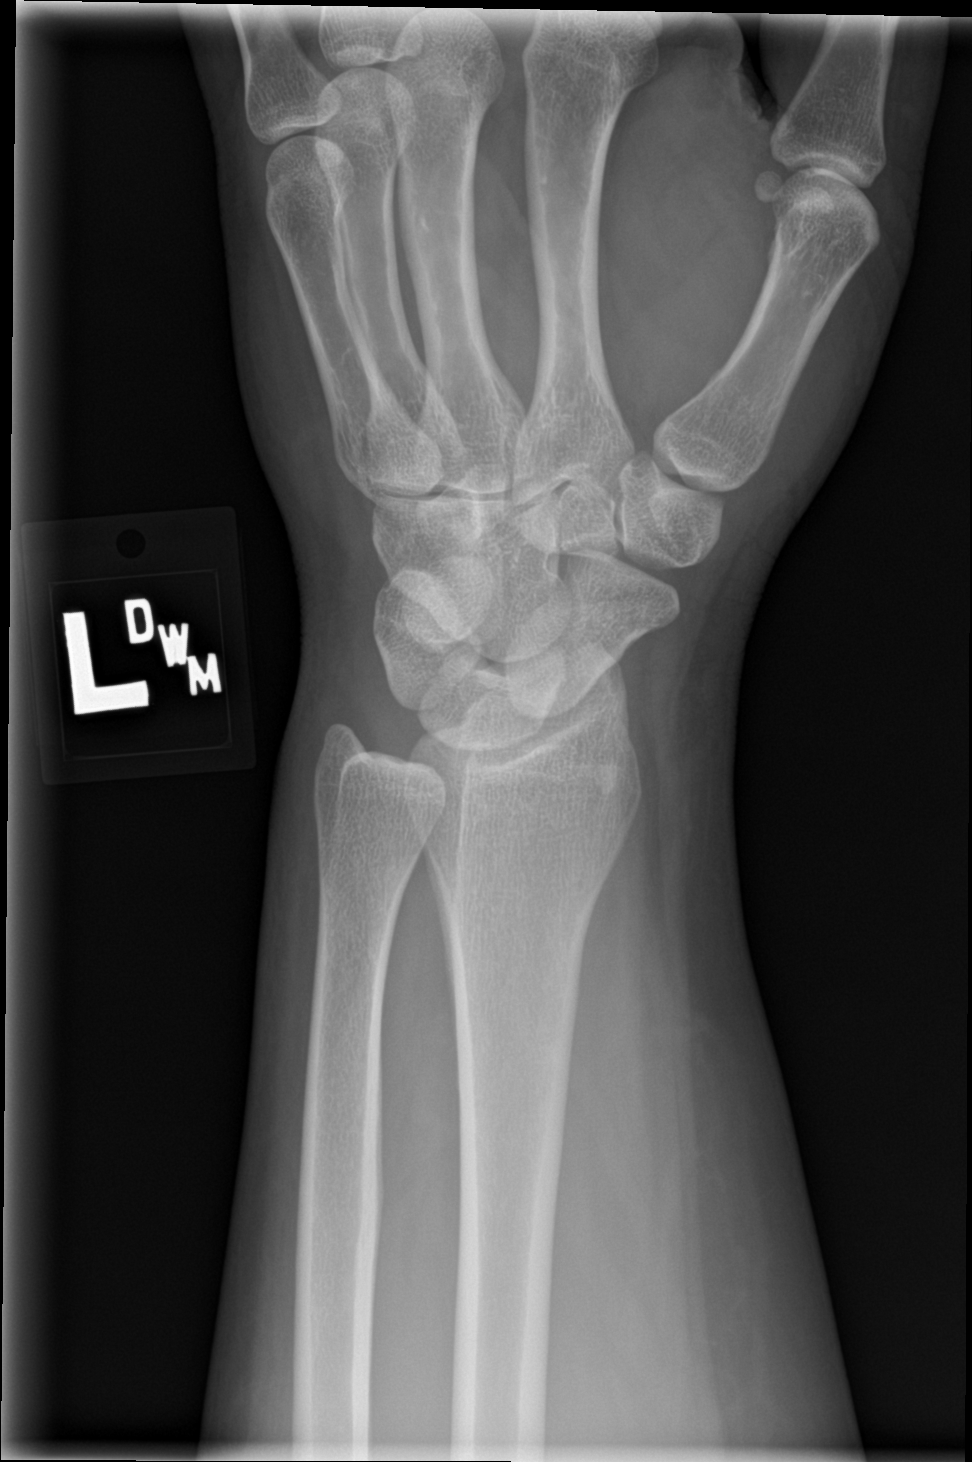

[wrist lat]
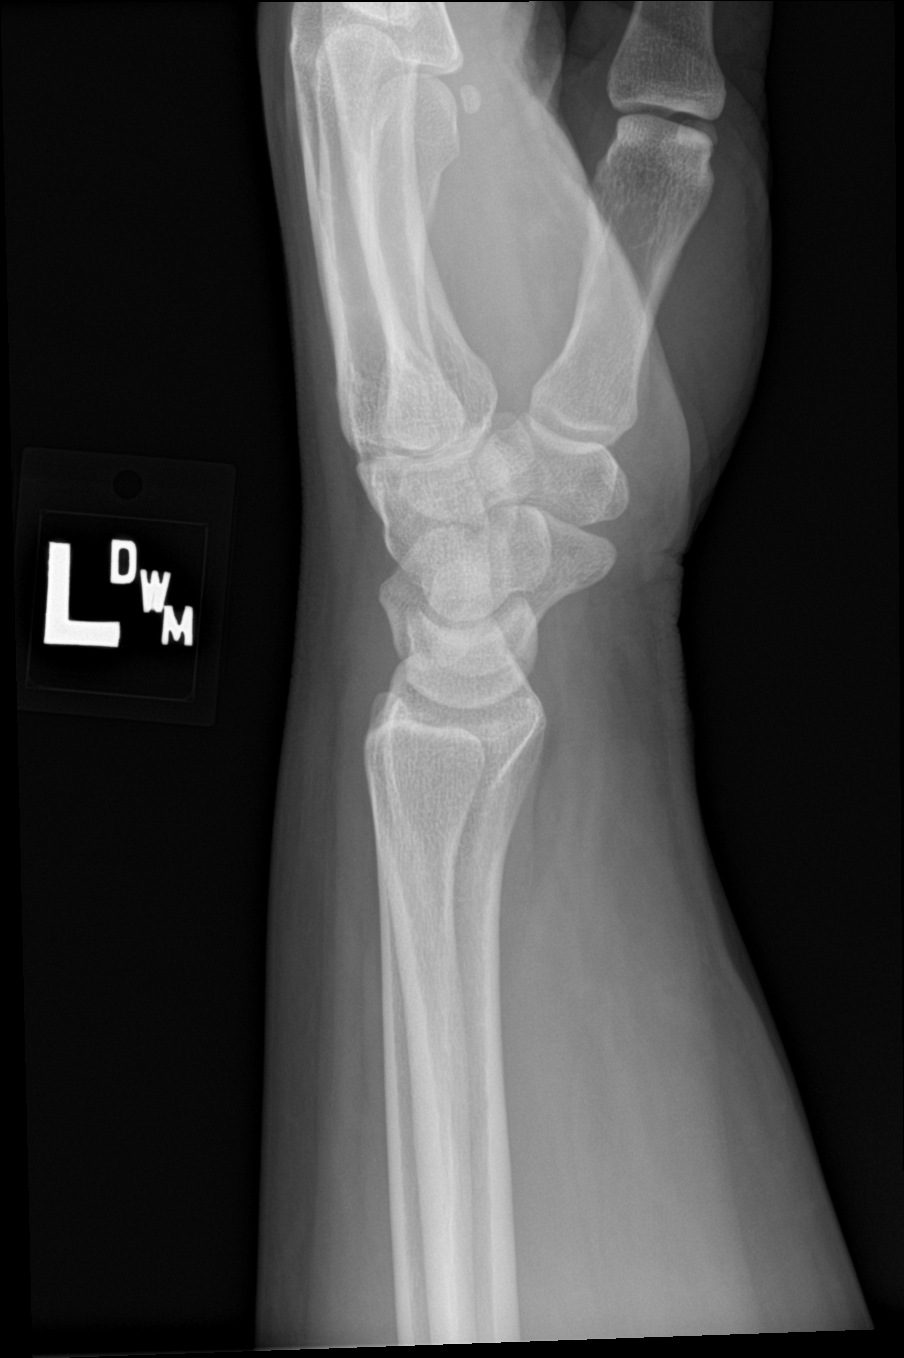

[wrist navicular]
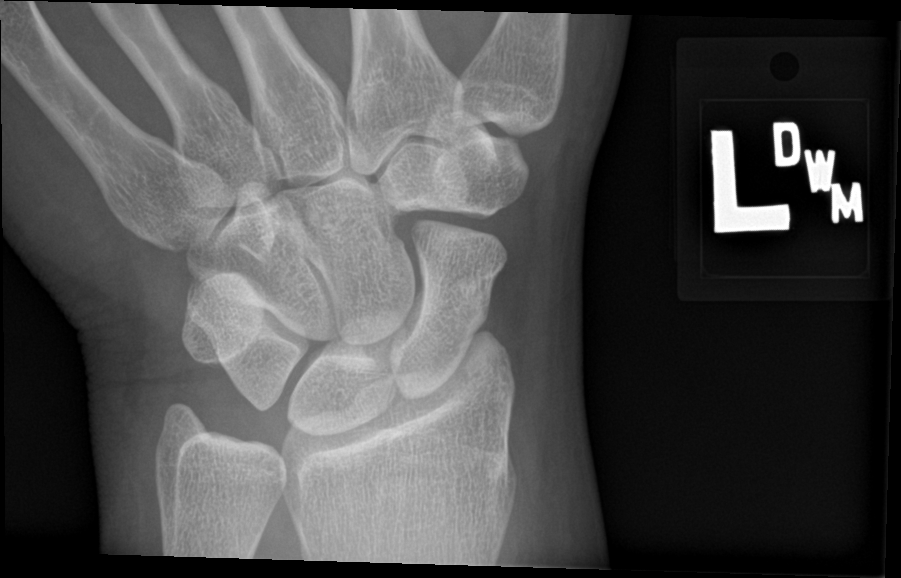

[4 of 4 positions shown; findings below may reference images not displayed]

FINDINGS: There is no evidence of fracture or dislocation. The carpal rows are
intact, and demonstrate normal alignment. The joint spaces are
preserved.

No significant soft tissue abnormalities are seen.
IMPRESSION: No evidence of fracture or dislocation.

## 2020-09-25 ENCOUNTER — Ambulatory Visit: Payer: Self-pay

## 2020-10-16 ENCOUNTER — Ambulatory Visit (INDEPENDENT_AMBULATORY_CARE_PROVIDER_SITE_OTHER): Payer: Managed Care, Other (non HMO) | Admitting: *Deleted

## 2020-10-16 ENCOUNTER — Other Ambulatory Visit: Payer: Self-pay

## 2020-10-16 DIAGNOSIS — J4550 Severe persistent asthma, uncomplicated: Secondary | ICD-10-CM | POA: Diagnosis not present

## 2020-10-28 NOTE — Progress Notes (Deleted)
Follow Up Note  RE: [name] MRN: [id number] DOB: Apr 14, 1981 Date of Office Visit: 10/29/2020  Referring provider: Sunnie Nielsen, DO Primary care provider: Sunnie Nielsen, DO  Chief Complaint: No chief complaint on file.  History of Present Illness: I had the pleasure of seeing [name] for a follow up visit at the [facility] on 10/28/2020. She is a 39 y.o. female, who is being followed for asthma on Fasenra, allergic rhinoconjunctivitis, GERD and history of frequent upper respiratory infection. Her previous allergy office visit was on 08/06/2020 with Dr. Selena Batten. Today is a regular follow up visit.  Not well controlled severe persistent asthma Past history - Patient was diagnosed with asthma over 30 years ago.  She was doing well up until 2012 when she got really ill and was hospitalized.  Recently started on Dulera 200 2 puffs twice a day which has been helping better than the Symbicort.  Still using albuterol rescue inhaler on a daily basis and had multiple courses of prednisone the last year with 2 ER visits and at least 6 urgent care visits as well.  Chest x-ray in December 2019 showed mild peribronchial thickening which may relate to chronic bronchitis or asthma.  Patient was followed by pulmonology in the past. COVID-19 in November 2020. Interim history - [name] is helping but the warm weather and humidity is causing some chest tightness. Still using albuterol 1-2 times per day especially at night. No oral prednisone, ER/urgent care visits.  Daily controller medication(s):Continue Dulera 2 puffs twice a day with spacer rinse mouth afterwards.  ? ContinueSpiriva1.56mcg2 puffs daily.  ? Continue Singulair 10mg  daily.  ? Continue Fasenra injections every 8 weeks.  ? START Alvesco 1 puff once a day during the warmer months and see if it helps your breathing. Sample given. Rinse mouth after each use.   Prior to physical activity:May  use albuterol rescue inhaler 2 puffs 5 to 15 minutes prior to strenuous physical activities.  Rescue medications:May use albuterol rescue inhaler 2 puffs or nebulizer every 4 to 6 hours as needed for shortness of breath, chest tightness, coughing, and wheezing. Monitor frequency of use.  During upper respiratory infections: StartPulmicort 0.5mg  nebulizer 2 times a day for 1-2 weeks during asthma flares.  Repeat spirometry at next visit.  Seasonal and perennial allergic rhinoconjunctivitis Past history - Perennial rhinoconjunctivitis symptoms with worsening in the fall and winter for 30+ years.  No recent ENT evaluation. 2020 skin testing was positive to: grass, tree, cat, dog, horse, mouse, ragweed, weed, mold. Interim history - Flonase does not help.  Continue environmental control measuresespecially regarding the cat dander.  May use over the counter antihistamines such as Zyrtec (cetirizine), Claritin (loratadine), Allegra (fexofenadine), or Xyzal (levocetirizine) daily as needed. May take twice a day if needed.  May use Flonase (fluticasone) nasal spray 1 spray per nostril twice a day as needed for nasal congestion.   Nasal saline spray (i.e., Simply Saline) or nasal saline lavage (i.e., NeilMed) is recommended as needed and prior to medicated nasal sprays.  Consider allergy immunotherapy once asthma is in better control and work schedule allows weekly injections.   Gastroesophageal reflux disease Improved with eliminating soda from her diet.   Continue omeprazole 40mg  in the morning.  History of frequent upper respiratory infection Past history - History of frequent upper respiratory infections including sinusitis, pneumonia and ear infections. 2020 normal immunoglobulin levels and good pneumococcal and tetanus/diptheria titers.  Interim history - 1  sinus infection treated with antibiotics.   Keep track of infections.  Multiple drug allergies Reaction to aspirin in  the form of hives as a child.  Continue to avoid aspirin, Levaquin and Pamprin.   Consider drug challenge once asthma is better controlled.   Vaccine counseling Discussed risks and benefits of COVID-19 vaccination.  Recommend getting the COVID-19 vaccine.   Return in about 3 months (around 11/06/2020).  Assessment and Plan: [name] is a 39 y.o. female with: No problem-specific Assessment & Plan notes found for this encounter.  No follow-ups on file.  No orders of the defined types were placed in this encounter.  Lab Orders  No laboratory test(s) ordered today    Diagnostics: Spirometry:  Tracings reviewed. Her effort: {Blank single:19197::"Good reproducible efforts.","It was hard to get consistent efforts and there is a question as to whether this reflects a maximal maneuver.","Poor effort, data can not be interpreted."} FVC: ***L FEV1: ***L, ***% predicted FEV1/FVC ratio: ***% Interpretation: {Blank single:19197::"Spirometry consistent with mild obstructive disease","Spirometry consistent with moderate obstructive disease","Spirometry consistent with severe obstructive disease","Spirometry consistent with possible restrictive disease","Spirometry consistent with mixed obstructive and restrictive disease","Spirometry uninterpretable due to technique","Spirometry consistent with normal pattern","No overt abnormalities noted given today's efforts"}.  Please see scanned spirometry results for details.  Skin Testing: {Blank single:19197::"Select foods","Environmental allergy panel","Environmental allergy panel and select foods","Food allergy panel","None","Deferred due to recent antihistamines use"}. Positive test to: ***. Negative test to: ***.  Results discussed with patient/family.   Medication List:  Current Outpatient Medications  Medication Sig Dispense Refill  . albuterol (VENTOLIN HFA) 108 (90 Base) MCG/ACT inhaler Inhale 2 puffs into the lungs every 4 (four) hours as  needed for wheezing or shortness of breath (coughing). 18 g 1  . budesonide (PULMICORT) 0.5 MG/2ML nebulizer solution Take 2 mLs (0.5 mg total) by nebulization 2 (two) times daily. 360 mL 1  . cetirizine (ZYRTEC) 10 MG tablet Take 10 mg by mouth at bedtime.    Marland Kitchen FASENRA 30 MG/ML SOSY INJECT 1 SYRINGE UNDER THE SKIN AT WEEK 8, FOLLOWED BY EVERY 8 WEEKS THEREAFTER 1 mL 6  . levalbuterol (XOPENEX) 1.25 MG/3ML nebulizer solution Take 1.25 mg by nebulization every 4 (four) hours as needed for wheezing or shortness of breath. 216 mL 1  . mometasone-formoterol (DULERA) 200-5 MCG/ACT AERO Inhale 2 puffs into the lungs 2 (two) times daily. 13 g 5  . montelukast (SINGULAIR) 10 MG tablet TAKE 1 TABLET AT BEDTIME 90 tablet 1  . Multiple Vitamin (MULTIVITAMIN WITH MINERALS) TABS tablet Take 1 tablet by mouth daily. Women's One A Day    . omeprazole (PRILOSEC) 40 MG capsule TAKE 1 CAPSULE DAILY 90 capsule 1  . SPIRIVA RESPIMAT 1.25 MCG/ACT AERS USE 2 INHALATIONS DAILY 12 g 0   Current Facility-Administered Medications  Medication Dose Route Frequency Provider Last Rate Last Admin  . Benralizumab SOSY 30 mg  30 mg Subcutaneous Q8 Weeks Marcelyn Bruins, MD   30 mg at 10/16/20 0900   Allergies: Allergies  Allergen Reactions  . Aspirin Hives  . Levaquin [Levofloxacin In D5w] Hives  . Pamprin [Apap-Pamabrom-Pyrilamine] Hives   I reviewed her past medical history, social history, family history, and environmental history and no significant changes have been reported from her previous visit.  Review of Systems  Constitutional: Negative for appetite change, chills, fever and unexpected weight change.  HENT: Positive for congestion. Negative for postnasal drip and rhinorrhea.   Eyes: Negative for itching.  Respiratory: Positive for chest tightness. Negative for  cough, shortness of breath and wheezing.   Cardiovascular: Negative for chest pain.  Gastrointestinal: Negative for abdominal pain.   Genitourinary: Negative for difficulty urinating.  Skin: Negative for rash.  Allergic/Immunologic: Positive for environmental allergies. Negative for food allergies.  Neurological: Negative for headaches.   Objective: There were no vitals taken for this visit. There is no height or weight on file to calculate BMI. Physical Exam Vitals and nursing note reviewed.  Constitutional:      Appearance: Normal appearance. She is well-developed.  HENT:     Head: Normocephalic and atraumatic.     Right Ear: Tympanic membrane and external ear normal.     Left Ear: Tympanic membrane and external ear normal.     Nose: Nose normal.     Mouth/Throat:     Mouth: Mucous membranes are moist.     Pharynx: Oropharynx is clear.  Eyes:     Conjunctiva/sclera: Conjunctivae normal.  Cardiovascular:     Rate and Rhythm: Normal rate and regular rhythm.     Heart sounds: Normal heart sounds. No murmur heard.  No friction rub. No gallop.   Pulmonary:     Effort: Pulmonary effort is normal.     Breath sounds: Normal breath sounds. No wheezing or rales.  Musculoskeletal:     Cervical back: Neck supple.  Skin:    General: Skin is warm.     Findings: No rash.  Neurological:     Mental Status: She is alert and oriented to person, place, and time.  Psychiatric:        Behavior: Behavior normal.    Previous notes and tests were reviewed. The plan was reviewed with the patient/family, and all questions/concerned were addressed.  It was my pleasure to see [name] today and participate in her care. Please feel free to contact me with any questions or concerns.  Sincerely,  Wyline Mood, DO Allergy & Immunology  [facility] office: [phone] [facility] office: [phone]

## 2020-10-29 ENCOUNTER — Ambulatory Visit: Payer: Managed Care, Other (non HMO) | Admitting: Allergy

## 2020-10-29 DIAGNOSIS — H1010 Acute atopic conjunctivitis, unspecified eye: Secondary | ICD-10-CM

## 2020-10-29 DIAGNOSIS — Z889 Allergy status to unspecified drugs, medicaments and biological substances status: Secondary | ICD-10-CM

## 2020-10-29 DIAGNOSIS — Z8709 Personal history of other diseases of the respiratory system: Secondary | ICD-10-CM

## 2020-10-29 DIAGNOSIS — J4550 Severe persistent asthma, uncomplicated: Secondary | ICD-10-CM

## 2020-10-29 DIAGNOSIS — K219 Gastro-esophageal reflux disease without esophagitis: Secondary | ICD-10-CM

## 2020-11-10 ENCOUNTER — Other Ambulatory Visit: Payer: Self-pay | Admitting: Allergy

## 2020-12-10 ENCOUNTER — Ambulatory Visit: Payer: Self-pay

## 2020-12-16 ENCOUNTER — Ambulatory Visit: Payer: Self-pay

## 2020-12-29 ENCOUNTER — Other Ambulatory Visit: Payer: Self-pay

## 2020-12-29 ENCOUNTER — Ambulatory Visit (INDEPENDENT_AMBULATORY_CARE_PROVIDER_SITE_OTHER): Payer: Managed Care, Other (non HMO)

## 2020-12-29 DIAGNOSIS — J4550 Severe persistent asthma, uncomplicated: Secondary | ICD-10-CM | POA: Diagnosis not present

## 2020-12-30 ENCOUNTER — Telehealth: Payer: Self-pay

## 2020-12-30 NOTE — Telephone Encounter (Signed)
Left message patient for patient to call back to see if if her current last name is her true last name or maiden name. Paperwork for Harrington Challenger is required to continue on medications?

## 2020-12-31 ENCOUNTER — Ambulatory Visit: Payer: Managed Care, Other (non HMO) | Admitting: Allergy

## 2021-01-05 ENCOUNTER — Ambulatory Visit: Payer: Managed Care, Other (non HMO) | Admitting: Allergy

## 2021-02-08 ENCOUNTER — Other Ambulatory Visit: Payer: Self-pay | Admitting: Allergy

## 2021-02-11 ENCOUNTER — Other Ambulatory Visit: Payer: Self-pay | Admitting: Allergy

## 2021-02-23 ENCOUNTER — Other Ambulatory Visit: Payer: Self-pay

## 2021-02-23 ENCOUNTER — Ambulatory Visit (INDEPENDENT_AMBULATORY_CARE_PROVIDER_SITE_OTHER): Payer: Managed Care, Other (non HMO) | Admitting: *Deleted

## 2021-02-23 DIAGNOSIS — J4550 Severe persistent asthma, uncomplicated: Secondary | ICD-10-CM | POA: Diagnosis not present

## 2021-02-25 NOTE — Progress Notes (Addendum)
Follow Up Note  RE: [name] MRN: [id number] DOB: 1981/10/14 Date of Office Visit: 02/26/2021  Referring provider: Sunnie Nielsen, DO Primary care provider: Sunnie Nielsen, DO  Chief Complaint: Asthma  History of Present Illness: I had the pleasure of seeing [name] for a follow up visit at the [facility] on 02/26/2021. She is a 40 y.o. female, who is being followed for asthma on Fasenra, allergic rhinoconjunctivitis, GERD, history of frequent respiratory infections, multiple drug allergies. Her previous allergy office visit was on 08/06/2020 with Dr. Selena Batten. Today is a regular follow up visit.  Asthma  Currently on Fasenra injections every 8 weeks with good benefit. Taking Dulera 2 puffs twice a day BID with spacer and rinsing mouth afterwards, Spiriva 1.23mcg 2 puffs daily, singulair 10mg  daily. Patient had few episodes of missing inhalers with no worsening symptoms.  Did not have to use Alves[place].  Used albuterol a few weeks ago while she had a sinus infection.  Denies any ER/urgent care visits or prednisone use since the last visit.  Voice hoarseness Patient is having some voice hoarseness and worse since November 2021. Patient works from home.  Denies any sore throat or trouble swallowing. Denies PND, already on PPI for GERD.  Seasonal and perennial allergic rhinoconjunctivitis Not taking any daily antihistamines. Not using nasal sprays.   Gastroesophageal reflux disease Currently taking omeprazole 40mg  in the morning and doing well on it.   History of frequent upper respiratory infection 1 sinus infection treated with antibiotics.   Got married since the last visit.  Assessment and Plan: [name] is a 40 y.o. female with: Severe persistent asthma without complication Past history - Diagnosed with asthma over 30 years ago.  She was doing well up until 2012 when she got really ill and was hospitalized.  Recently started on  Dulera 200 2 puffs twice a day which has been helping better than the Symbicort.  Still using albuterol rescue inhaler on a daily basis and had multiple courses of prednisone the last year with 2 ER visits and at least 6 urgent care visits as well.  Chest x-ray in December 2019 showed mild peribronchial thickening which may relate to chronic bronchitis or asthma.  Patient was followed by pulmonology in the past. COVID-19 in November 2020. Interim history - doing well with below regimen with no prednisone since last visit and only using albuterol on rare occasions.   ACT score 20.  Today's spirometry showed restriction.  Having some voice hoarseness but no pharyngitis symptoms.   Daily controller medication(s):stop dulera  Start Symbicort January 2020 2 puffs twice a day with spacer and rinse mouth afterwards.  ? ContinueSpiriva1.36mcg2 puffs daily.  ? Continue Singulair 10mg  daily.  ? Continue Fasenra injections every 8 weeks.  Prior to physical activity:May use albuterol rescue inhaler 2 puffs 5 to 15 minutes prior to strenuous physical activities.  Rescue medications:May use albuterol rescue inhaler 2 puffs or nebulizer every 4 to 6 hours as needed for shortness of breath, chest tightness, coughing, and wheezing. Monitor frequency of use.  During upper respiratory infections: Start Pulmicort 0.5mg  nebulizer 2 times a day for 1-2 weeks during asthma flares.   Repeat spirometry at next visit.  Voice hoarseness Since November 2021. Denies any pain, soreness or trouble swallowing. Denies PND, already on PPI for GERD and using spacer and rinsing mouth after inhaler use.  Refer to ENT to look at vocal cords - Dr. 32m.   Changes her ICS/LABA  inhaler to see if it improves her symptoms.   Seasonal and perennial allergic rhinoconjunctivitis Past history - Perennial rhinoconjunctivitis symptoms with worsening in the fall and winter for 30+ years.  No recent ENT evaluation. 2020 skin testing  was positive to: grass, tree, cat, dog, horse, mouse, ragweed, weed, mold. Interim history - stable with no daily meds.  Continue environmental control measures especially regarding the cat dander.   May use over the counter antihistamines such as Zyrtec (cetirizine), Claritin (loratadine), Allegra (fexofenadine), or Xyzal (levocetirizine) daily as needed. May take twice a day if needed.  May use Flonase (fluticasone) nasal spray 1 spray per nostril twice a day as needed for nasal congestion.   Nasal saline spray (i.e., Simply Saline) or nasal saline lavage (i.e., NeilMed) is recommended as needed and prior to medicated nasal sprays.  Consider allergy immunotherapy once asthma is in better control and work schedule allows weekly injections.   History of frequent upper respiratory infection Past history - History of frequent upper respiratory infections including sinusitis, pneumonia and ear infections. 2020 normal immunoglobulin levels and good pneumococcal and tetanus/diptheria titers.  Interim history - one infection and 1 course of antibiotics.  Keep track of infections.  Gastroesophageal reflux disease  Continue omeprazole 40mg  in the morning.  Multiple drug allergies Past history - Reaction to aspirin in the form of hives as a child.  Continue to avoid aspirin, Levaquin and Pamprin.   Consider drug challenge once asthma is better controlled.   Return in about 3 months (around 05/29/2021).  Meds ordered this encounter  Medications  . budesonide-formoterol (SYMBICORT) 160-4.5 MCG/ACT inhaler    Sig: Inhale 2 puffs into the lungs in the morning and at bedtime. with spacer and rinse mouth afterwards.    Dispense:  1 each    Refill:  2  . montelukast (SINGULAIR) 10 MG tablet    Sig: Take 1 tablet (10 mg total) by mouth at bedtime.    Dispense:  90 tablet    Refill:  2  . omeprazole (PRILOSEC) 40 MG capsule    Sig: Take 1 capsule (40 mg total) by mouth daily.    Dispense:   90 capsule    Refill:  2  . Tiotropium Bromide Monohydrate (SPIRIVA RESPIMAT) 1.25 MCG/ACT AERS    Sig: Inhale 2 puffs into the lungs daily.    Dispense:  12 g    Refill:  2   Lab Orders  No laboratory test(s) ordered today    Diagnostics: Spirometry:  Tracings reviewed. Her effort: It was hard to get consistent efforts and there is a question as to whether this reflects a maximal maneuver. FVC: 1.92L FEV1: 1.52L, 47% predicted FEV1/FVC ratio: 79% Interpretation: Spirometry consistent with severe restriction. Please see scanned spirometry results for details.  Medication List:  Current Outpatient Medications  Medication Sig Dispense Refill  . albuterol (VENTOLIN HFA) 108 (90 Base) MCG/ACT inhaler Inhale 2 puffs into the lungs every 4 (four) hours as needed for wheezing or shortness of breath (coughing). 18 g 1  . budesonide-formoterol (SYMBICORT) 160-4.5 MCG/ACT inhaler Inhale 2 puffs into the lungs in the morning and at bedtime. with spacer and rinse mouth afterwards. 1 each 2  . cetirizine (ZYRTEC) 10 MG tablet Take 10 mg by mouth at bedtime.    07/29/2021 FASENRA 30 MG/ML SOSY INJECT 1 SYRINGE UNDER THE SKIN AT WEEK 8, FOLLOWED BY EVERY 8 WEEKS THEREAFTER 1 mL 6  . levalbuterol (XOPENEX) 1.25 MG/3ML nebulizer solution Take 1.25 mg by  nebulization every 4 (four) hours as needed for wheezing or shortness of breath. 216 mL 1  . montelukast (SINGULAIR) 10 MG tablet Take 1 tablet (10 mg total) by mouth at bedtime. 90 tablet 2  . Multiple Vitamin (MULTIVITAMIN WITH MINERALS) TABS tablet Take 1 tablet by mouth daily. Women's One A Day    . omeprazole (PRILOSEC) 40 MG capsule Take 1 capsule (40 mg total) by mouth daily. 90 capsule 2  . Tiotropium Bromide Monohydrate (SPIRIVA RESPIMAT) 1.25 MCG/ACT AERS Inhale 2 puffs into the lungs daily. 12 g 2  . budesonide (PULMICORT) 0.5 MG/2ML nebulizer solution USE 2 ML (0.5 MG TOTAL) BY NEBULIZATION TWO TIMES     DAILY (Patient not taking: Reported on  02/26/2021) 60 mL 0   Current Facility-Administered Medications  Medication Dose Route Frequency Provider Last Rate Last Admin  . Benralizumab SOSY 30 mg  30 mg Subcutaneous Q8 Weeks Marcelyn Bruins, MD   30 mg at 02/23/21 0900   Allergies: Allergies  Allergen Reactions  . Aspirin Hives  . Levaquin [Levofloxacin In D5w] Hives  . Pamprin [Apap-Pamabrom-Pyrilamine] Hives   I reviewed her past medical history, social history, family history, and environmental history and no significant changes have been reported from her previous visit.  Review of Systems  Constitutional: Negative for appetite change, chills, fever and unexpected weight change.  HENT: Positive for voice change. Negative for congestion, postnasal drip and rhinorrhea.   Eyes: Negative for itching.  Respiratory: Negative for cough, chest tightness, shortness of breath and wheezing.   Cardiovascular: Negative for chest pain.  Gastrointestinal: Negative for abdominal pain.  Genitourinary: Negative for difficulty urinating.  Skin: Negative for rash.  Allergic/Immunologic: Positive for environmental allergies. Negative for food allergies.  Neurological: Negative for headaches.   Objective: BP 132/80   Pulse 96   Resp 18   Ht 5\' 6"  (1.676 m)   Wt (!) 322 lb 4 oz (146.2 kg)   SpO2 98%   BMI 52.01 kg/m  Body mass index is 52.01 kg/m. Physical Exam Vitals and nursing note reviewed.  Constitutional:      Appearance: Normal appearance. She is well-developed.  HENT:     Head: Normocephalic and atraumatic.     Right Ear: Tympanic membrane and external ear normal.     Left Ear: Tympanic membrane and external ear normal.     Nose: Nose normal.     Mouth/Throat:     Mouth: Mucous membranes are moist.     Pharynx: Oropharynx is clear.  Eyes:     Conjunctiva/sclera: Conjunctivae normal.  Cardiovascular:     Rate and Rhythm: Normal rate and regular rhythm.     Heart sounds: Normal heart sounds. No murmur  heard. No friction rub. No gallop.   Pulmonary:     Effort: Pulmonary effort is normal.     Breath sounds: Normal breath sounds. No wheezing or rales.  Musculoskeletal:     Cervical back: Neck supple.  Skin:    General: Skin is warm.     Findings: No rash.  Neurological:     Mental Status: She is alert and oriented to person, place, and time.  Psychiatric:        Behavior: Behavior normal.    Previous notes and tests were reviewed. The plan was reviewed with the patient/family, and all questions/concerned were addressed.  It was my pleasure to see Prescious today and participate in her care. Please feel free to contact me with any questions or concerns.  Sincerely,  Rexene Alberts, DO Allergy & Immunology  [facility] [place] office: [place] office: [phone]

## 2021-02-26 ENCOUNTER — Encounter: Payer: Self-pay | Admitting: Allergy

## 2021-02-26 ENCOUNTER — Other Ambulatory Visit: Payer: Self-pay

## 2021-02-26 ENCOUNTER — Telehealth: Payer: Self-pay

## 2021-02-26 ENCOUNTER — Ambulatory Visit: Payer: Managed Care, Other (non HMO) | Admitting: Allergy

## 2021-02-26 VITALS — BP 132/80 | HR 96 | Resp 18 | Ht 66.0 in | Wt 322.2 lb

## 2021-02-26 DIAGNOSIS — Z8709 Personal history of other diseases of the respiratory system: Secondary | ICD-10-CM | POA: Diagnosis not present

## 2021-02-26 DIAGNOSIS — J4550 Severe persistent asthma, uncomplicated: Secondary | ICD-10-CM | POA: Diagnosis not present

## 2021-02-26 DIAGNOSIS — K219 Gastro-esophageal reflux disease without esophagitis: Secondary | ICD-10-CM

## 2021-02-26 DIAGNOSIS — R490 Dysphonia: Secondary | ICD-10-CM

## 2021-02-26 DIAGNOSIS — H1010 Acute atopic conjunctivitis, unspecified eye: Secondary | ICD-10-CM

## 2021-02-26 DIAGNOSIS — Z889 Allergy status to unspecified drugs, medicaments and biological substances status: Secondary | ICD-10-CM | POA: Diagnosis not present

## 2021-02-26 DIAGNOSIS — H1013 Acute atopic conjunctivitis, bilateral: Secondary | ICD-10-CM

## 2021-02-26 DIAGNOSIS — J3089 Other allergic rhinitis: Secondary | ICD-10-CM

## 2021-02-26 DIAGNOSIS — J302 Other seasonal allergic rhinitis: Secondary | ICD-10-CM

## 2021-02-26 MED ORDER — BUDESONIDE-FORMOTEROL FUMARATE 160-4.5 MCG/ACT IN AERO: 2.0000 | INHALATION_SPRAY | Freq: Two times a day (BID) | RESPIRATORY_TRACT | 2 refills | Status: DC

## 2021-02-26 MED ORDER — SPIRIVA RESPIMAT 1.25 MCG/ACT IN AERS: 2.0000 | INHALATION_SPRAY | Freq: Every day | RESPIRATORY_TRACT | 2 refills | Status: DC

## 2021-02-26 MED ORDER — OMEPRAZOLE 40 MG PO CPDR: 40.0000 mg | DELAYED_RELEASE_CAPSULE | Freq: Every day | ORAL | 2 refills | Status: DC

## 2021-02-26 MED ORDER — MONTELUKAST SODIUM 10 MG PO TABS: 10.0000 mg | ORAL_TABLET | Freq: Every day | ORAL | 2 refills | Status: DC

## 2021-02-26 NOTE — Assessment & Plan Note (Signed)
Past history - History of frequent upper respiratory infections including sinusitis, pneumonia and ear infections. 2020 normal immunoglobulin levels and good pneumococcal and tetanus/diptheria titers.  Interim history - one infection and 1 course of antibiotics.  Keep track of infections.

## 2021-02-26 NOTE — Assessment & Plan Note (Signed)
Past history - Reaction to aspirin in the form of hives as a child. Continue to avoid aspirin, Levaquin and Pamprin.  Consider drug challenge once asthma is better controlled.  

## 2021-02-26 NOTE — Addendum Note (Signed)
Addended by: Florence Canner on: 02/26/2021 05:27 PM   Modules accepted: Orders

## 2021-02-26 NOTE — Assessment & Plan Note (Signed)
Past history - Perennial rhinoconjunctivitis symptoms with worsening in the fall and winter for 30+ years.  No recent ENT evaluation. 2020 skin testing was positive to: grass, tree, cat, dog, horse, mouse, ragweed, weed, mold. Interim history - stable with no daily meds.  Continue environmental control measures especially regarding the cat dander.   May use over the counter antihistamines such as Zyrtec (cetirizine), Claritin (loratadine), Allegra (fexofenadine), or Xyzal (levocetirizine) daily as needed. May take twice a day if needed.  May use Flonase (fluticasone) nasal spray 1 spray per nostril twice a day as needed for nasal congestion.   Nasal saline spray (i.e., Simply Saline) or nasal saline lavage (i.e., NeilMed) is recommended as needed and prior to medicated nasal sprays.  Consider allergy immunotherapy once asthma is in better control and work schedule allows weekly injections.

## 2021-02-26 NOTE — Assessment & Plan Note (Signed)
   Continue omeprazole 40mg  in the morning.

## 2021-02-26 NOTE — Patient Instructions (Addendum)
Asthma  Daily controller medication(s):stop dulera  Start Symbicort 2 puffs twice a day with spacer and rinse mouth afterwards. ? ContinueSpiriva1.1mcg2 puffs daily.  ? Continue Singulair 10mg  daily.  ? Continue Fasenra injections every 8 weeks.  Prior to physical activity:May use albuterol rescue inhaler 2 puffs 5 to 15 minutes prior to strenuous physical activities.  Rescue medications:May use albuterol rescue inhaler 2 puffs or nebulizer every 4 to 6 hours as needed for shortness of breath, chest tightness, coughing, and wheezing. Monitor frequency of use.  During upper respiratory infections: Start Pulmicort 0.5mg  nebulizer 2 times a day for 1-2 weeks during asthma flares.  Asthma control goals:  Full participation in all desired activities (may need albuterol before activity) Albuterol use two times or less a week on average (not counting use with activity) Cough interfering with sleep two times or less a month Oral steroids no more than once a year No hospitalizations   Voice hoarseness  Refer to ENT - Dr. .   Seasonal and perennial allergic rhinoconjunctivitis 2020 skin testing was positive to: grass, tree, cat, dog, horse, mouse, ragweed, weed, mold.  Continue environmental control measures especially regarding the cat dander.   May use over the counter antihistamines such as Zyrtec (cetirizine), Claritin (loratadine), Allegra (fexofenadine), or Xyzal (levocetirizine) daily as needed. May take twice a day if needed.  May use Flonase (fluticasone) nasal spray 1 spray per nostril twice a day as needed for nasal congestion.   Nasal saline spray (i.e., Simply Saline) or nasal saline lavage (i.e., NeilMed) is recommended as needed and prior to medicated nasal sprays.  History of frequent upper respiratory infection  Keep track of infections.  Gastroesophageal reflux disease  Continue omeprazole 40mg  in the morning.  Follow up in 3 months or sooner  if needed.  Recommend getting the COVID-19 vaccine - Suszanne Conners or . Here's more information: [employer]

## 2021-02-26 NOTE — Assessment & Plan Note (Signed)
Since November 2021. Denies any pain, soreness or trouble swallowing. Denies PND, already on PPI for GERD and using spacer and rinsing mouth after inhaler use.  Refer to ENT to look at vocal cords - Dr. Suszanne Conners.   Changes her ICS/LABA inhaler to see if it improves her symptoms.

## 2021-02-26 NOTE — Assessment & Plan Note (Signed)
Past history - Diagnosed with asthma over 30 years ago.  She was doing well up until 2012 when she got really ill and was hospitalized.  Recently started on Dulera 200 2 puffs twice a day which has been helping better than the Symbicort.  Still using albuterol rescue inhaler on a daily basis and had multiple courses of prednisone the last year with 2 ER visits and at least 6 urgent care visits as well.  Chest x-ray in December 2019 showed mild peribronchial thickening which may relate to chronic bronchitis or asthma.  Patient was followed by pulmonology in the past. COVID-19 in November 2020. Interim history - doing well with below regimen with no prednisone since last visit and only using albuterol on rare occasions.   ACT score 20.  Today's spirometry showed restriction.  Having some voice hoarseness but no pharyngitis symptoms.   Daily controller medication(s):stop dulera  Start Symbicort 2 puffs twice a day with spacer and rinse mouth afterwards.  ? ContinueSpiriva1.43mcg2 puffs daily.  ? Continue Singulair 10mg  daily.  ? Continue Fasenra injections every 8 weeks.  Prior to physical activity:May use albuterol rescue inhaler 2 puffs 5 to 15 minutes prior to strenuous physical activities.  Rescue medications:May use albuterol rescue inhaler 2 puffs or nebulizer every 4 to 6 hours as needed for shortness of breath, chest tightness, coughing, and wheezing. Monitor frequency of use.  During upper respiratory infections: Start Pulmicort 0.5mg  nebulizer 2 times a day for 1-2 weeks during asthma flares.   Repeat spirometry at next visit.

## 2021-02-26 NOTE — Assessment & Plan Note (Signed)
>>  ASSESSMENT AND PLAN FOR HISTORY OF FREQUENT UPPER RESPIRATORY INFECTION WRITTEN ON 02/26/2021  5:20 PM BY Ellamae Sia, DO  Past history - History of frequent upper respiratory infections including sinusitis, pneumonia and ear infections. 2020 normal immunoglobulin levels and good pneumococcal and tetanus/diptheria titers.  Interim history - one infection and 1 course of antibiotics.  Keep track of infections.

## 2021-02-26 NOTE — Addendum Note (Signed)
Addended by: Ellamae Sia on: 02/26/2021 05:25 PM   Modules accepted: Orders

## 2021-02-26 NOTE — Telephone Encounter (Signed)
ENT Referral w/ Dr. Suszanne Conners

## 2021-03-02 NOTE — Telephone Encounter (Signed)
Referral faxed to Dr. Suszanne Conners along with notes and demographics. Left detailed message on patient's phone informing her.

## 2021-03-05 ENCOUNTER — Other Ambulatory Visit: Payer: Self-pay | Admitting: *Deleted

## 2021-03-05 MED ORDER — FASENRA 30 MG/ML ~~LOC~~ SOSY
30.0000 mg | PREFILLED_SYRINGE | SUBCUTANEOUS | 6 refills | Status: DC
Start: 2021-03-05 — End: 2024-03-29

## 2021-04-13 ENCOUNTER — Encounter: Payer: Self-pay | Admitting: Allergy

## 2021-04-13 NOTE — Progress Notes (Signed)
Reviewed note from Dr. Suszanne Conners - ENT (03/31/2021). 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 Posterior laryngeal edema - reflux. See scanned notes.

## 2021-04-20 ENCOUNTER — Ambulatory Visit: Payer: Self-pay

## 2021-04-30 ENCOUNTER — Ambulatory Visit (INDEPENDENT_AMBULATORY_CARE_PROVIDER_SITE_OTHER): Payer: Managed Care, Other (non HMO) | Admitting: *Deleted

## 2021-04-30 ENCOUNTER — Other Ambulatory Visit: Payer: Self-pay

## 2021-04-30 DIAGNOSIS — J4550 Severe persistent asthma, uncomplicated: Secondary | ICD-10-CM

## 2021-06-25 ENCOUNTER — Ambulatory Visit: Payer: Self-pay

## 2021-07-08 NOTE — Progress Notes (Deleted)
Follow Up Note  RE: [name] MRN: [id number] DOB: 1981-05-04 Date of Office Visit: 07/09/2021  Referring provider: Sunnie Nielsen, DO Primary care provider: Sunnie Nielsen, DO  Chief Complaint: No chief complaint on file.  History of Present Illness: I had the pleasure of seeing [name] for a [facility]  on 07/08/2021. She is a 40 y.o. female, who is being followed for asthma on Fasenra, voice hoarseness, allergic rhinoconjunctivitis, history of frequent upper respiratory infection, GERD and multiple drug allergies. Her previous allergy office visit was on 02/26/2021 with Dr. Selena Batten. Today is a regular follow up visit.  Severe persistent asthma without complication Past history - Diagnosed with asthma over 30 years ago.  She was doing well up until 2012 when she got really ill and was hospitalized.  Recently started on Dulera 200 2 puffs twice a day which has been helping better than the Symbicort.  Still using albuterol rescue inhaler on a daily basis and had multiple courses of prednisone the last year with 2 ER visits and at least 6 urgent care visits as well.  Chest x-ray in December 2019 showed mild peribronchial thickening which may relate to chronic bronchitis or asthma.  Patient was followed by pulmonology in the past. COVID-19 in November 2020. Interim history - doing well with below regimen with no prednisone since last visit and only using albuterol on rare occasions. ACT score 20. Today's spirometry showed restriction. Having some voice hoarseness but no pharyngitis symptoms.  Daily controller medication(s):  stop dulera Start Symbicort 2 puffs twice a day with spacer and rinse mouth afterwards. Continue Spiriva 1.59mcg 2 puffs daily. Continue Singulair 10mg  daily. Continue Fasenra injections every 8 weeks. Prior to physical activity: May use albuterol rescue inhaler 2 puffs 5 to 15 minutes prior to strenuous  physical activities. Rescue medications: May use albuterol rescue inhaler 2 puffs or nebulizer every 4 to 6 hours as needed for shortness of breath, chest tightness, coughing, and wheezing. Monitor frequency of use.  During upper respiratory infections: Start Pulmicort 0.5mg  nebulizer 2 times a day for 1-2 weeks during asthma flares.  Repeat spirometry at next visit.   Voice hoarseness Since November 2021. Denies any pain, soreness or trouble swallowing. Denies PND, already on PPI for GERD and using spacer and rinsing mouth after inhaler use. Refer to ENT to look at vocal cords - Dr. December 2021. Changes her ICS/LABA inhaler to see if it improves her symptoms.    Seasonal and perennial allergic rhinoconjunctivitis Past history - Perennial rhinoconjunctivitis symptoms with worsening in the fall and winter for 30+ years.  No recent ENT evaluation. 2020 skin testing was positive to: grass, tree, cat, dog, horse, mouse, ragweed, weed, mold. Interim history - stable with no daily meds. Continue environmental control measures especially regarding the cat dander.  May use over the counter antihistamines such as Zyrtec (cetirizine), Claritin (loratadine), Allegra (fexofenadine), or Xyzal (levocetirizine) daily as needed. May take twice a day if needed. May use Flonase (fluticasone) nasal spray 1 spray per nostril twice a day as needed for nasal congestion. Nasal saline spray (i.e., Simply Saline) or nasal saline lavage (i.e., NeilMed) is recommended as needed and prior to medicated nasal sprays.  Consider allergy immunotherapy once asthma is in better control and work schedule allows weekly injections.    History of frequent upper respiratory infection Past history - History of frequent upper respiratory infections including sinusitis, pneumonia and ear infections. 2020 normal  immunoglobulin levels and good pneumococcal and tetanus/diptheria titers. Interim history - one infection and 1 course of  antibiotics. Keep track of infections.   Gastroesophageal reflux disease Continue omeprazole 40mg  in the morning.   Multiple drug allergies Past history - Reaction to aspirin in the form of hives as a child. Continue to avoid aspirin, Levaquin and Pamprin. Consider drug challenge once asthma is better controlled.    Return in about 3 months (around 05/29/2021).    Assessment and Plan: [name] is a 40 y.o. female with: No problem-specific Assessment & Plan notes found for this encounter.  No follow-ups on file.  No orders of the defined types were placed in this encounter.  Lab Orders  No laboratory test(s) ordered today    Diagnostics: Spirometry:  Tracings reviewed. Her effort: {Blank single:19197::"Good reproducible efforts.","It was hard to get consistent efforts and there is a question as to whether this reflects a maximal maneuver.","Poor effort, data can not be interpreted."} FVC: ***L FEV1: ***L, ***% predicted FEV1/FVC ratio: ***% Interpretation: {Blank single:19197::"Spirometry consistent with mild obstructive disease","Spirometry consistent with moderate obstructive disease","Spirometry consistent with severe obstructive disease","Spirometry consistent with possible restrictive disease","Spirometry consistent with mixed obstructive and restrictive disease","Spirometry uninterpretable due to technique","Spirometry consistent with normal pattern","No overt abnormalities noted given today's efforts"}.  Please see scanned spirometry results for details.  Skin Testing: {Blank single:19197::"Select foods","Environmental allergy panel","Environmental allergy panel and select foods","Food allergy panel","None","Deferred due to recent antihistamines use"}. Positive test to: ***. Negative test to: ***.  Results discussed with patient/family.   Medication List:  Current Outpatient Medications  Medication Sig Dispense Refill   albuterol (VENTOLIN HFA) 108 (90 Base) MCG/ACT  inhaler INHALE 2 PUFFS BY MOUTH INTO THE LUNGS EVERY 4 (FOUR) HOURS AS NEEDED FOR WHEEZING OR SHORTNESS OF BREATH (COUGHING). 18 g 1   Benralizumab (FASENRA) 30 MG/ML SOSY Inject 1 mL (30 mg total) into the skin every 8 (eight) weeks. 1 mL 6   budesonide (PULMICORT) 0.5 MG/2ML nebulizer solution USE 2 ML (0.5 MG TOTAL) BY NEBULIZATION TWO TIMES     DAILY (Patient not taking: Reported on 02/26/2021) 60 mL 0   budesonide-formoterol (SYMBICORT) 160-4.5 MCG/ACT inhaler Inhale 2 puffs into the lungs in the morning and at bedtime. with spacer and rinse mouth afterwards. 1 each 2   cetirizine (ZYRTEC) 10 MG tablet Take 10 mg by mouth at bedtime.     levalbuterol (XOPENEX) 1.25 MG/3ML nebulizer solution Take 1.25 mg by nebulization every 4 (four) hours as needed for wheezing or shortness of breath. 216 mL 1   montelukast (SINGULAIR) 10 MG tablet Take 1 tablet (10 mg total) by mouth at bedtime. 90 tablet 2   Multiple Vitamin (MULTIVITAMIN WITH MINERALS) TABS tablet Take 1 tablet by mouth daily. Women's One A Day     omeprazole (PRILOSEC) 40 MG capsule Take 1 capsule (40 mg total) by mouth daily. 90 capsule 2   Tiotropium Bromide Monohydrate (SPIRIVA RESPIMAT) 1.25 MCG/ACT AERS Inhale 2 puffs into the lungs daily. 12 g 2   Current Facility-Administered Medications  Medication Dose Route Frequency Provider Last Rate Last Admin   Benralizumab SOSY 30 mg  30 mg Subcutaneous Q8 Weeks 04/28/2021, MD   30 mg at 04/30/21 1612   Allergies: Allergies  Allergen Reactions   Aspirin Hives   Levaquin [Levofloxacin In D5w] Hives   Pamprin [Apap-Pamabrom-Pyrilamine] Hives   I reviewed her past medical history, social history, family history, and environmental history and no significant changes have been reported from her  previous visit.  Review of Systems  Constitutional:  Negative for appetite change, chills, fever and unexpected weight change.  HENT:  Positive for voice change. Negative for  congestion, postnasal drip and rhinorrhea.   Eyes:  Negative for itching.  Respiratory:  Negative for cough, chest tightness, shortness of breath and wheezing.   Cardiovascular:  Negative for chest pain.  Gastrointestinal:  Negative for abdominal pain.  Genitourinary:  Negative for difficulty urinating.  Skin:  Negative for rash.  Allergic/Immunologic: Positive for environmental allergies. Negative for food allergies.  Neurological:  Negative for headaches.   Objective: There were no vitals taken for this visit. There is no height or weight on file to calculate BMI. Physical Exam Vitals and nursing note reviewed.  Constitutional:      Appearance: Normal appearance. She is well-developed.  HENT:     Head: Normocephalic and atraumatic.     Right Ear: Tympanic membrane and external ear normal.     Left Ear: Tympanic membrane and external ear normal.     Nose: Nose normal.     Mouth/Throat:     Mouth: Mucous membranes are moist.     Pharynx: Oropharynx is clear.  Eyes:     Conjunctiva/sclera: Conjunctivae normal.  Cardiovascular:     Rate and Rhythm: Normal rate and regular rhythm.     Heart sounds: Normal heart sounds. No murmur heard.   No friction rub. No gallop.  Pulmonary:     Effort: Pulmonary effort is normal.     Breath sounds: Normal breath sounds. No wheezing or rales.  Musculoskeletal:     Cervical back: Neck supple.  Skin:    General: Skin is warm.     Findings: No rash.  Neurological:     Mental Status: She is alert and oriented to person, place, and time.  Psychiatric:        Behavior: Behavior normal.   Previous notes and tests were reviewed. The plan was reviewed with the patient/family, and all questions/concerned were addressed.  It was my pleasure to see [name] today and participate in her care. Please feel free to contact me with any questions or concerns.  Sincerely,  Wyline Mood, DO Allergy & Immunology  [facility] office: [phone] [facility] office: [phone]

## 2021-07-09 ENCOUNTER — Ambulatory Visit: Payer: Managed Care, Other (non HMO) | Admitting: Allergy

## 2021-07-09 DIAGNOSIS — Z8709 Personal history of other diseases of the respiratory system: Secondary | ICD-10-CM

## 2021-07-09 DIAGNOSIS — Z889 Allergy status to unspecified drugs, medicaments and biological substances status: Secondary | ICD-10-CM

## 2021-07-09 DIAGNOSIS — K219 Gastro-esophageal reflux disease without esophagitis: Secondary | ICD-10-CM

## 2021-07-09 DIAGNOSIS — J4550 Severe persistent asthma, uncomplicated: Secondary | ICD-10-CM

## 2021-07-09 DIAGNOSIS — R490 Dysphonia: Secondary | ICD-10-CM

## 2021-07-09 DIAGNOSIS — J3089 Other allergic rhinitis: Secondary | ICD-10-CM

## 2021-08-05 NOTE — Progress Notes (Deleted)
Follow Up Note  RE: [name] MRN: [id number] DOB: 01-26-1981 Date of Office Visit: 08/06/2021  Referring provider: Sunnie Nielsen, DO Primary care provider: Sunnie Nielsen, DO  Chief Complaint: No chief complaint on file.  History of Present Illness: I had the pleasure of seeing [name] for a follow up visit at the [facility] [place] on 08/05/2021. She is a 40 y.o. female, who is being followed for asthma on Fasenra, voice hoarseness, allergic rhinoconjunctivitis, history of frequent upper respiratory infection, GERD and multiple drug allergies. Her previous allergy office visit was on 02/26/2021 with Dr. Selena Batten. Today is a regular follow up visit.  Severe persistent asthma without complication Past history - Diagnosed with asthma over 30 years ago.  She was doing well up until 2012 when she got really ill and was hospitalized.  Recently started on Dulera 200 2 puffs twice a day which has been helping better than the Symbicort.  Still using albuterol rescue inhaler on a daily basis and had multiple courses of prednisone the last year with 2 ER visits and at least 6 urgent care visits as well.  Chest x-ray in December 2019 showed mild peribronchial thickening which may relate to chronic bronchitis or asthma.  Patient was followed by pulmonology in the past. COVID-19 in November 2020. Interim history - doing well with below regimen with no prednisone since last visit and only using albuterol on rare occasions.  ACT score 20. Today's spirometry showed restriction. Having some voice hoarseness but no pharyngitis symptoms.  Daily controller medication(s):  stop dulera Start Symbicort 2 puffs twice a day with spacer and rinse mouth afterwards.  Continue Spiriva 1.52mcg 2 puffs daily.  Continue Singulair 10mg  daily.  Continue Fasenra injections every 8 weeks. Prior to physical activity: May use albuterol rescue inhaler 2 puffs 5 to 15 minutes prior to strenuous  physical activities. Rescue medications: May use albuterol rescue inhaler 2 puffs or nebulizer every 4 to 6 hours as needed for shortness of breath, chest tightness, coughing, and wheezing. Monitor frequency of use.  During upper respiratory infections: Start Pulmicort 0.5mg  nebulizer 2 times a day for 1-2 weeks during asthma flares.  Repeat spirometry at next visit.   Voice hoarseness Since November 2021. Denies any pain, soreness or trouble swallowing. Denies PND, already on PPI for GERD and using spacer and rinsing mouth after inhaler use. Refer to ENT to look at vocal cords - Dr. December 2021.  Changes her ICS/LABA inhaler to see if it improves her symptoms.    Seasonal and perennial allergic rhinoconjunctivitis Past history - Perennial rhinoconjunctivitis symptoms with worsening in the fall and winter for 30+ years.  No recent ENT evaluation. 2020 skin testing was positive to: grass, tree, cat, dog, horse, mouse, ragweed, weed, mold. Interim history - stable with no daily meds. Continue environmental control measures especially regarding the cat dander.  May use over the counter antihistamines such as Zyrtec (cetirizine), Claritin (loratadine), Allegra (fexofenadine), or Xyzal (levocetirizine) daily as needed. May take twice a day if needed. May use Flonase (fluticasone) nasal spray 1 spray per nostril twice a day as needed for nasal congestion.  Nasal saline spray (i.e., Simply Saline) or nasal saline lavage (i.e., NeilMed) is recommended as needed and prior to medicated nasal sprays.  Consider allergy immunotherapy once asthma is in better control and work schedule allows weekly injections.    History of frequent upper respiratory infection Past history - History of frequent upper respiratory infections including sinusitis,  pneumonia and ear infections. 2020 normal immunoglobulin levels and good pneumococcal and tetanus/diptheria titers.  Interim history - one infection and 1 course of  antibiotics. Keep track of infections.   Gastroesophageal reflux disease Continue omeprazole 40mg  in the morning.   Multiple drug allergies Past history - Reaction to aspirin in the form of hives as a child. Continue to avoid aspirin, Levaquin and Pamprin.  Consider drug challenge once asthma is better controlled.    Return in about 3 months (around 05/29/2021).    Assessment and Plan: [name] is a 40 y.o. female with: No problem-specific Assessment & Plan notes found for this encounter.  No follow-ups on file.  No orders of the defined types were placed in this encounter.  Lab Orders  No laboratory test(s) ordered today    Diagnostics: Spirometry:  Tracings reviewed. Her effort: {Blank single:19197::"Good reproducible efforts.","It was hard to get consistent efforts and there is a question as to whether this reflects a maximal maneuver.","Poor effort, data can not be interpreted."} FVC: ***L FEV1: ***L, ***% predicted FEV1/FVC ratio: ***% Interpretation: {Blank single:19197::"Spirometry consistent with mild obstructive disease","Spirometry consistent with moderate obstructive disease","Spirometry consistent with severe obstructive disease","Spirometry consistent with possible restrictive disease","Spirometry consistent with mixed obstructive and restrictive disease","Spirometry uninterpretable due to technique","Spirometry consistent with normal pattern","No overt abnormalities noted given today's efforts"}.  Please see scanned spirometry results for details.  Skin Testing: {Blank single:19197::"Select foods","Environmental allergy panel","Environmental allergy panel and select foods","Food allergy panel","None","Deferred due to recent antihistamines use"}. *** Results discussed with patient/family.   Medication List:  Current Outpatient Medications  Medication Sig Dispense Refill   albuterol (VENTOLIN HFA) 108 (90 Base) MCG/ACT inhaler INHALE 2 PUFFS BY MOUTH INTO THE LUNGS  EVERY 4 (FOUR) HOURS AS NEEDED FOR WHEEZING OR SHORTNESS OF BREATH (COUGHING). 18 g 1   Benralizumab (FASENRA) 30 MG/ML SOSY Inject 1 mL (30 mg total) into the skin every 8 (eight) weeks. 1 mL 6   budesonide (PULMICORT) 0.5 MG/2ML nebulizer solution USE 2 ML (0.5 MG TOTAL) BY NEBULIZATION TWO TIMES     DAILY (Patient not taking: Reported on 02/26/2021) 60 mL 0   budesonide-formoterol (SYMBICORT) 160-4.5 MCG/ACT inhaler Inhale 2 puffs into the lungs in the morning and at bedtime. with spacer and rinse mouth afterwards. 1 each 2   cetirizine (ZYRTEC) 10 MG tablet Take 10 mg by mouth at bedtime.     levalbuterol (XOPENEX) 1.25 MG/3ML nebulizer solution Take 1.25 mg by nebulization every 4 (four) hours as needed for wheezing or shortness of breath. 216 mL 1   montelukast (SINGULAIR) 10 MG tablet Take 1 tablet (10 mg total) by mouth at bedtime. 90 tablet 2   Multiple Vitamin (MULTIVITAMIN WITH MINERALS) TABS tablet Take 1 tablet by mouth daily. Women's One A Day     omeprazole (PRILOSEC) 40 MG capsule Take 1 capsule (40 mg total) by mouth daily. 90 capsule 2   Tiotropium Bromide Monohydrate (SPIRIVA RESPIMAT) 1.25 MCG/ACT AERS Inhale 2 puffs into the lungs daily. 12 g 2   Current Facility-Administered Medications  Medication Dose Route Frequency Provider Last Rate Last Admin   Benralizumab SOSY 30 mg  30 mg Subcutaneous Q8 Weeks 04/28/2021, MD   30 mg at 04/30/21 1612   Allergies: Allergies  Allergen Reactions   Aspirin Hives   Levaquin [Levofloxacin In D5w] Hives   Pamprin [Apap-Pamabrom-Pyrilamine] Hives   I reviewed her past medical history, social history, family history, and environmental history and no significant changes have been reported from her  previous visit.  Review of Systems  Constitutional:  Negative for appetite change, chills, fever and unexpected weight change.  HENT:  Positive for voice change. Negative for congestion, postnasal drip and rhinorrhea.   Eyes:   Negative for itching.  Respiratory:  Negative for cough, chest tightness, shortness of breath and wheezing.   Cardiovascular:  Negative for chest pain.  Gastrointestinal:  Negative for abdominal pain.  Genitourinary:  Negative for difficulty urinating.  Skin:  Negative for rash.  Allergic/Immunologic: Positive for environmental allergies. Negative for food allergies.  Neurological:  Negative for headaches.   Objective: There were no vitals taken for this visit. There is no height or weight on file to calculate BMI. Physical Exam Vitals and nursing note reviewed.  Constitutional:      Appearance: Normal appearance. She is well-developed.  HENT:     Head: Normocephalic and atraumatic.     Right Ear: Tympanic membrane and external ear normal.     Left Ear: Tympanic membrane and external ear normal.     Nose: Nose normal.     Mouth/Throat:     Mouth: Mucous membranes are moist.     Pharynx: Oropharynx is clear.  Eyes:     Conjunctiva/sclera: Conjunctivae normal.  Cardiovascular:     Rate and Rhythm: Normal rate and regular rhythm.     Heart sounds: Normal heart sounds. No murmur heard.   No friction rub. No gallop.  Pulmonary:     Effort: Pulmonary effort is normal.     Breath sounds: Normal breath sounds. No wheezing or rales.  Musculoskeletal:     Cervical back: Neck supple.  Skin:    General: Skin is warm.     Findings: No rash.  Neurological:     Mental Status: She is alert and oriented to person, place, and time.  Psychiatric:        Behavior: Behavior normal.   Previous notes and tests were reviewed. The plan was reviewed with the patient/family, and all questions/concerned were addressed.  It was my pleasure to see [name] today and participate in her care. Please feel free to contact me with any questions or concerns.  Sincerely,  Wyline Mood, DO Allergy & Immunology  [facility] office: [phone] [facility] office:  [phone]

## 2021-08-06 ENCOUNTER — Telehealth: Payer: Self-pay | Admitting: Allergy

## 2021-08-06 ENCOUNTER — Ambulatory Visit: Payer: Managed Care, Other (non HMO) | Admitting: Allergy

## 2021-08-06 DIAGNOSIS — Z889 Allergy status to unspecified drugs, medicaments and biological substances status: Secondary | ICD-10-CM

## 2021-08-06 DIAGNOSIS — J3089 Other allergic rhinitis: Secondary | ICD-10-CM

## 2021-08-06 DIAGNOSIS — R490 Dysphonia: Secondary | ICD-10-CM

## 2021-08-06 DIAGNOSIS — K219 Gastro-esophageal reflux disease without esophagitis: Secondary | ICD-10-CM

## 2021-08-06 DIAGNOSIS — J4550 Severe persistent asthma, uncomplicated: Secondary | ICD-10-CM

## 2021-08-06 DIAGNOSIS — Z8709 Personal history of other diseases of the respiratory system: Secondary | ICD-10-CM

## 2021-08-06 MED ORDER — OMEPRAZOLE 40 MG PO CPDR: 40.0000 mg | DELAYED_RELEASE_CAPSULE | Freq: Every day | ORAL | 0 refills | Status: DC

## 2021-08-06 MED ORDER — MONTELUKAST SODIUM 10 MG PO TABS: 10.0000 mg | ORAL_TABLET | Freq: Every day | ORAL | 0 refills | Status: DC

## 2021-08-06 MED ORDER — BUDESONIDE-FORMOTEROL FUMARATE 160-4.5 MCG/ACT IN AERO: 2.0000 | INHALATION_SPRAY | Freq: Two times a day (BID) | RESPIRATORY_TRACT | 0 refills | Status: DC

## 2021-08-06 NOTE — Telephone Encounter (Signed)
Patient called to cancel her appointment because her kids have COVID. Patient will call back as soon as she is able to look at her work schedule. Patient would like to know if all of her medications could be transferred to [facility] mail in service. Patient also states she really likes Symbicort and would like to know if a refill could be sent to [facility] in service.  Please advise.

## 2021-08-06 NOTE — Telephone Encounter (Signed)
Sent in a 1 month refill until pt is seen

## 2021-08-19 ENCOUNTER — Telehealth: Payer: Self-pay | Admitting: Allergy

## 2021-08-19 NOTE — Telephone Encounter (Signed)
Patient called and made appointment for 09/10/2021 in [place] with dr kim. She needs to have 3 month supply of Ventolin and Symbicoct called into [facility] [phone].

## 2021-08-20 MED ORDER — BUDESONIDE-FORMOTEROL FUMARATE 160-4.5 MCG/ACT IN AERO: 2.0000 | INHALATION_SPRAY | Freq: Two times a day (BID) | RESPIRATORY_TRACT | 0 refills | Status: DC

## 2021-08-20 NOTE — Telephone Encounter (Signed)
Sent in  month supply of curtesy refill of Symbicort to [facility]

## 2021-09-10 ENCOUNTER — Ambulatory Visit: Payer: Managed Care, Other (non HMO) | Admitting: Allergy

## 2021-09-10 ENCOUNTER — Encounter: Payer: Self-pay | Admitting: Allergy

## 2021-09-10 ENCOUNTER — Other Ambulatory Visit: Payer: Self-pay

## 2021-09-10 VITALS — BP 110/80 | HR 102 | Temp 98.6°F | Resp 18

## 2021-09-10 DIAGNOSIS — Z8709 Personal history of other diseases of the respiratory system: Secondary | ICD-10-CM

## 2021-09-10 DIAGNOSIS — J302 Other seasonal allergic rhinitis: Secondary | ICD-10-CM | POA: Diagnosis not present

## 2021-09-10 DIAGNOSIS — J4550 Severe persistent asthma, uncomplicated: Secondary | ICD-10-CM

## 2021-09-10 DIAGNOSIS — K219 Gastro-esophageal reflux disease without esophagitis: Secondary | ICD-10-CM | POA: Diagnosis not present

## 2021-09-10 DIAGNOSIS — H1010 Acute atopic conjunctivitis, unspecified eye: Secondary | ICD-10-CM

## 2021-09-10 DIAGNOSIS — Z889 Allergy status to unspecified drugs, medicaments and biological substances status: Secondary | ICD-10-CM

## 2021-09-10 DIAGNOSIS — H1013 Acute atopic conjunctivitis, bilateral: Secondary | ICD-10-CM

## 2021-09-10 DIAGNOSIS — R490 Dysphonia: Secondary | ICD-10-CM

## 2021-09-10 DIAGNOSIS — J3089 Other allergic rhinitis: Secondary | ICD-10-CM

## 2021-09-10 MED ORDER — ALBUTEROL SULFATE HFA 108 (90 BASE) MCG/ACT IN AERS: 2.0000 | INHALATION_SPRAY | RESPIRATORY_TRACT | 1 refills | Status: DC | PRN

## 2021-09-10 MED ORDER — SPIRIVA RESPIMAT 1.25 MCG/ACT IN AERS: 2.0000 | INHALATION_SPRAY | Freq: Every day | RESPIRATORY_TRACT | 2 refills | Status: DC

## 2021-09-10 MED ORDER — OMEPRAZOLE 40 MG PO CPDR: 40.0000 mg | DELAYED_RELEASE_CAPSULE | Freq: Every day | ORAL | 2 refills | Status: DC

## 2021-09-10 MED ORDER — BUDESONIDE-FORMOTEROL FUMARATE 160-4.5 MCG/ACT IN AERO: 2.0000 | INHALATION_SPRAY | Freq: Two times a day (BID) | RESPIRATORY_TRACT | 2 refills | Status: DC

## 2021-09-10 MED ORDER — MONTELUKAST SODIUM 10 MG PO TABS: 10.0000 mg | ORAL_TABLET | Freq: Every day | ORAL | 2 refills | Status: DC

## 2021-09-10 NOTE — Progress Notes (Signed)
Follow Up Note  RE: [name] MRN: [id number] DOB: 1981-07-23 Date of Office Visit: 09/10/2021  Referring provider: Sunnie Nielsen, DO Primary care provider: Sunnie Nielsen, DO  Chief Complaint: Asthma  History of Present Illness: I had the pleasure of seeing [name] for a follow up visit at the [facility] on 09/10/2021. She is a 40 y.o. female, who is being followed for asthma on Fasenra, voice hoarseness, allergic rhino conjunctivitis, h/o frequent URIs, GERD and multiple drug allergies. Her previous allergy office visit was on 02/26/2021 with Dr. Selena Batten. Today is a regular follow up visit.  Severe persistent asthma Patient has not been able to receive her [employer] injections since May 2022 due to her work schedule. She did notice some chest tightness when around cats and dogs since off the injections. She is interested in doing the injections at home. She used to give insulin to her father so not afraid of needles.   So far no issues with Fasenra.   Currently on Symbicort 2 puffs twice a day with spacer and rinsing mouth after each use.  Not using Spiriva anymore. Takes Singulair 10mg  daily.  The last 2 months used her rescue inhaler - 2-3 times per day due to wheezing and chest tightness. Sometimes this occurs after exertion.   Denies any ER/urgent care visits or prednisone use since the last visit.  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 This is causing the voice hoarseness which has improved. She went to voice lab but insurance won't cover the sessions.    Seasonal and perennial allergic rhinoconjunctivitis Asymptomatic with no meds.   History of frequent upper respiratory infection No infections since the last visit.    Gastroesophageal reflux disease Taking omeprazole 40mg  in the morning with good benefit.   Assessment and Plan: [name] is a 40 y.o. female with: Severe persistent asthma without  complication Past history - Diagnosed with asthma over 30 years ago.  She was doing well up until 2012 when she got really ill and was hospitalized. Multiple courses of prednisone the last year with 2 ER visits and at least 6 urgent care visits as well.  Chest x-ray in December 2019 showed mild peribronchial thickening which may relate to chronic bronchitis or asthma.  Patient was followed by pulmonology in the past. COVID-19 in November 2020. Interim history - was doing well but late for Fasenra injections and stopped Spiriva. Now using albuterol 2-3 times a day but no prednisone since last visit.  Today's spirometry showed restriction. Daily controller medication(s):  continue Symbicort January 2020 2 puffs twice a day with spacer and rinse mouth afterwards. Restart Spiriva 1.28mcg 2 puffs daily.  Continue Singulair 10mg  daily.  Restart Fasenra injections every 8 weeks - okay to take at home.  Make appointment at [facility] office for the next injection so they can show you how to use it.  Prior to physical activity: May use albuterol rescue inhaler 2 puffs 5 to 15 minutes prior to strenuous physical activities. Rescue medications: May use albuterol rescue inhaler 2 puffs or nebulizer every 4 to 6 hours as needed for shortness of breath, chest tightness, coughing, and wheezing. Monitor frequency of use.  During upper respiratory infections: Start Pulmicort 0.5mg  nebulizer 2 times a day for 1-2 weeks during asthma flares.  Get spirometry at next visit.  Voice hoarseness Past history - Since November 2021. Denies PND, already on PPI for GERD and using spacer  and rinsing mouth after inhaler use. 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 Improved.  Follow up with ENT - Dr. Suszanne Conners as scheduled.   Seasonal and perennial allergic rhinoconjunctivitis Past history - Perennial rhinoconjunctivitis symptoms with worsening in the fall and winter for 30+ years. 2020 skin  testing was positive to: grass, tree, cat, dog, horse, mouse, ragweed, weed, mold. Interim history - stable with no daily meds. Continue environmental control measures especially regarding the cat dander.  May use over the counter antihistamines such as Zyrtec (cetirizine), Claritin (loratadine), Allegra (fexofenadine), or Xyzal (levocetirizine) daily as needed. May take twice a day if needed. May use Flonase (fluticasone) nasal spray 1 spray per nostril twice a day as needed for nasal congestion.  Nasal saline spray (i.e., Simply Saline) or nasal saline lavage (i.e., NeilMed) is recommended as needed and prior to medicated nasal sprays.  Consider allergy immunotherapy once asthma is in better control and work schedule allows weekly injections.   Gastroesophageal reflux disease Continue omeprazole 40mg  in the morning. Nothing to eat or drink for 30 minutes afterwards.   History of frequent upper respiratory infection Past history - History of frequent upper respiratory infections including sinusitis, pneumonia and ear infections. 2020 normal immunoglobulin levels and good pneumococcal and tetanus/diptheria titers.  Interim history - no infections. Keep track of infections.  Multiple drug allergies Past history - Reaction to aspirin in the form of hives as a child. Continue to avoid aspirin, Levaquin and Pamprin.  Consider drug challenge once asthma is better controlled.   Return in about 3 months (around 12/10/2021).  Meds ordered this encounter  Medications   budesonide-formoterol (SYMBICORT) 160-4.5 MCG/ACT inhaler    Sig: Inhale 2 puffs into the lungs in the morning and at bedtime. with spacer and rinse mouth afterwards.    Dispense:  30 g    Refill:  2   montelukast (SINGULAIR) 10 MG tablet    Sig: Take 1 tablet (10 mg total) by mouth at bedtime.    Dispense:  90 tablet    Refill:  2   Tiotropium Bromide Monohydrate (SPIRIVA RESPIMAT) 1.25 MCG/ACT AERS    Sig: Inhale 2 puffs  into the lungs daily.    Dispense:  12 g    Refill:  2   omeprazole (PRILOSEC) 40 MG capsule    Sig: Take 1 capsule (40 mg total) by mouth daily.    Dispense:  90 capsule    Refill:  2   albuterol (VENTOLIN HFA) 108 (90 Base) MCG/ACT inhaler    Sig: Inhale 2 puffs into the lungs every 4 (four) hours as needed for wheezing or shortness of breath (coughing fits).    Dispense:  36 g    Refill:  1    Lab Orders  No laboratory test(s) ordered today    Diagnostics: Spirometry:  Tracings reviewed. Her effort: Good reproducible efforts. FVC: 2.28L FEV1: 1.82L, 56% predicted FEV1/FVC ratio: 80% Interpretation: Spirometry consistent with possible restrictive disease.  Please see scanned spirometry results for details.  Medication List:  Current Outpatient Medications  Medication Sig Dispense Refill   albuterol (VENTOLIN HFA) 108 (90 Base) MCG/ACT inhaler Inhale 2 puffs into the lungs every 4 (four) hours as needed for wheezing or shortness of breath (coughing fits). 36 g 1   Benralizumab (FASENRA) 30 MG/ML SOSY Inject 1 mL (30 mg total) into the skin every 8 (eight) weeks. 1 mL 6   Multiple Vitamin (MULTIVITAMIN WITH MINERALS)  TABS tablet Take 1 tablet by mouth daily. Women's One A Day     budesonide (PULMICORT) 0.5 MG/2ML nebulizer solution USE 2 ML (0.5 MG TOTAL) BY NEBULIZATION TWO TIMES     DAILY (Patient not taking: Reported on 09/10/2021) 60 mL 0   budesonide-formoterol (SYMBICORT) 160-4.5 MCG/ACT inhaler Inhale 2 puffs into the lungs in the morning and at bedtime. with spacer and rinse mouth afterwards. 30 g 2   cetirizine (ZYRTEC) 10 MG tablet Take 10 mg by mouth at bedtime. (Patient not taking: Reported on 09/10/2021)     levalbuterol (XOPENEX) 1.25 MG/3ML nebulizer solution Take 1.25 mg by nebulization every 4 (four) hours as needed for wheezing or shortness of breath. (Patient not taking: Reported on 09/10/2021) 216 mL 1   montelukast (SINGULAIR) 10 MG tablet Take 1 tablet (10 mg  total) by mouth at bedtime. 90 tablet 2   omeprazole (PRILOSEC) 40 MG capsule Take 1 capsule (40 mg total) by mouth daily. 90 capsule 2   Tiotropium Bromide Monohydrate (SPIRIVA RESPIMAT) 1.25 MCG/ACT AERS Inhale 2 puffs into the lungs daily. 12 g 2   Current Facility-Administered Medications  Medication Dose Route Frequency Provider Last Rate Last Admin   Benralizumab SOSY 30 mg  30 mg Subcutaneous Q8 Weeks Marcelyn Bruins, MD   30 mg at 04/30/21 1612   Allergies: Allergies  Allergen Reactions   Aspirin Hives   Levaquin [Levofloxacin In D5w] Hives   Pamprin [Apap-Pamabrom-Pyrilamine] Hives   I reviewed her past medical history, social history, family history, and environmental history and no significant changes have been reported from her previous visit.  Review of Systems  Constitutional:  Negative for appetite change, chills, fever and unexpected weight change.  HENT:  Positive for voice change. Negative for congestion, postnasal drip and rhinorrhea.   Eyes:  Negative for itching.  Respiratory:  Positive for chest tightness and wheezing. Negative for cough and shortness of breath.   Cardiovascular:  Negative for chest pain.  Gastrointestinal:  Negative for abdominal pain.  Genitourinary:  Negative for difficulty urinating.  Skin:  Negative for rash.  Allergic/Immunologic: Positive for environmental allergies. Negative for food allergies.  Neurological:  Negative for headaches.   Objective: BP 110/80   Pulse (!) 102   Temp 98.6 F (37 C) (Temporal)   Resp 18   SpO2 97%  There is no height or weight on file to calculate BMI. Physical Exam Vitals and nursing note reviewed.  Constitutional:      Appearance: Normal appearance. She is well-developed.  HENT:     Head: Normocephalic and atraumatic.     Right Ear: Tympanic membrane and external ear normal.     Left Ear: Tympanic membrane and external ear normal.     Nose: Nose normal.     Mouth/Throat:     Mouth:  Mucous membranes are moist.     Pharynx: Oropharynx is clear.  Eyes:     Conjunctiva/sclera: Conjunctivae normal.  Cardiovascular:     Rate and Rhythm: Normal rate and regular rhythm.     Heart sounds: Normal heart sounds. No murmur heard.   No friction rub. No gallop.  Pulmonary:     Effort: Pulmonary effort is normal.     Breath sounds: Normal breath sounds. No wheezing or rales.  Musculoskeletal:     Cervical back: Neck supple.  Skin:    General: Skin is warm.     Findings: No rash.  Neurological:     Mental Status: She is alert  and oriented to person, place, and time.  Psychiatric:        Behavior: Behavior normal.   Previous notes and tests were reviewed. The plan was reviewed with the patient/family, and all questions/concerned were addressed.  It was my pleasure to see [name] today and participate in her care. Please feel free to contact me with any questions or concerns.  Sincerely,  Wyline Mood, DO Allergy & Immunology  [facility] office: [phone] [facility] office: [phone]

## 2021-09-10 NOTE — Assessment & Plan Note (Signed)
Past history - Diagnosed with asthma over 30 years ago.  She was doing well up until 2012 when she got really ill and was hospitalized. Multiple courses of prednisone the last year with 2 ER visits and at least 6 urgent care visits as well.  Chest x-ray in December 2019 showed mild peribronchial thickening which may relate to chronic bronchitis or asthma.  Patient was followed by pulmonology in the past. COVID-19 in November 2020. Interim history - was doing well but late for Fasenra injections and stopped Spiriva. Now using albuterol 2-3 times a day but no prednisone since last visit.   Today's spirometry showed restriction.  Daily controller medication(s):  continue Symbicort 2 puffs twice a day with spacer and rinse mouth afterwards. ? Restart Spiriva 1.29mcg 2 puffs daily.  ? Continue Singulair 10mg  daily.  ? Restart Fasenra injections every 8 weeks - okay to take at home.  ? Make appointment at [facility] office for the next injection so they can show you how to use it.   Prior to physical activity: May use albuterol rescue inhaler 2 puffs 5 to 15 minutes prior to strenuous physical activities.  Rescue medications: May use albuterol rescue inhaler 2 puffs or nebulizer every 4 to 6 hours as needed for shortness of breath, chest tightness, coughing, and wheezing. Monitor frequency of use.   During upper respiratory infections: Start Pulmicort 0.5mg  nebulizer 2 times a day for 1-2 weeks during asthma flares.   Get spirometry at next visit.

## 2021-09-10 NOTE — Assessment & Plan Note (Signed)
Past history - Reaction to aspirin in the form of hives as a child. Continue to avoid aspirin, Levaquin and Pamprin.  Consider drug challenge once asthma is better controlled.  

## 2021-09-10 NOTE — Assessment & Plan Note (Signed)
   Continue omeprazole 40mg  in the morning. Nothing to eat or drink for 30 minutes afterwards.

## 2021-09-10 NOTE — Patient Instructions (Addendum)
Asthma Daily controller medication(s):  continue Symbicort 2 puffs twice a day with spacer and rinse mouth afterwards. Restart Spiriva 1.93mcg 2 puffs daily.  Continue Singulair 10mg  daily.  Restart Fasenra injections every 8 weeks - okay to take at home.  Make appointment at [facility] office for the next injection so they can show you how to use it.  Prior to physical activity: May use albuterol rescue inhaler 2 puffs 5 to 15 minutes prior to strenuous physical activities. Rescue medications: May use albuterol rescue inhaler 2 puffs or nebulizer every 4 to 6 hours as needed for shortness of breath, chest tightness, coughing, and wheezing. Monitor frequency of use.  During upper respiratory infections: Start Pulmicort 0.5mg  nebulizer 2 times a day for 1-2 weeks during asthma flares.  Asthma control goals:  Full participation in all desired activities (may need albuterol before activity) Albuterol use two times or less a week on average (not counting use with activity) Cough interfering with sleep two times or less a month Oral steroids no more than once a year No hospitalizations    Voice hoarseness Follow up with ENT - [facility]. [facility].   Seasonal and perennial allergic rhinoconjunctivitis 2020 skin testing was positive to: grass, tree, cat, dog, horse, mouse, ragweed, weed, mold. Continue environmental control measures especially regarding the cat dander.  May use over the counter antihistamines such as Zyrtec (cetirizine), Claritin (loratadine), Allegra (fexofenadine), or Xyzal (levocetirizine) daily as needed. May take twice a day if needed. May use Flonase (fluticasone) nasal spray 1 spray per nostril twice a day as needed for nasal congestion.  Nasal saline spray (i.e., Simply Saline) or nasal saline lavage (i.e., NeilMed) is recommended as needed and prior to medicated nasal sprays.   History of frequent upper respiratory infection Keep track of infections.   Gastroesophageal  reflux disease Continue omeprazole 40mg  in the morning. Nothing to eat or drink for 30 minutes afterwards.  Follow up in 3 months or sooner if needed.

## 2021-09-10 NOTE — Assessment & Plan Note (Signed)
>>  ASSESSMENT AND PLAN FOR HISTORY OF FREQUENT UPPER RESPIRATORY INFECTION WRITTEN ON 09/10/2021  1:14 PM BY Ellamae Sia, DO  Past history - History of frequent upper respiratory infections including sinusitis, pneumonia and ear infections. 2020 normal immunoglobulin levels and good pneumococcal and tetanus/diptheria titers.  Interim history - no infections.  Keep track of infections.

## 2021-09-10 NOTE — Assessment & Plan Note (Signed)
Past history - Since November 2021. Denies PND, already on PPI for GERD and using spacer and rinsing mouth after inhaler use. 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 Improved.   Follow up with ENT - Dr. Suszanne Conners as scheduled.

## 2021-09-10 NOTE — Assessment & Plan Note (Signed)
Past history - History of frequent upper respiratory infections including sinusitis, pneumonia and ear infections. 2020 normal immunoglobulin levels and good pneumococcal and tetanus/diptheria titers.  Interim history - no infections.  Keep track of infections.

## 2021-09-10 NOTE — Assessment & Plan Note (Signed)
Past history - Perennial rhinoconjunctivitis symptoms with worsening in the fall and winter for 30+ years. 2020 skin testing was positive to: grass, tree, cat, dog, horse, mouse, ragweed, weed, mold. Interim history - stable with no daily meds.  Continue environmental control measures especially regarding the cat dander.   May use over the counter antihistamines such as Zyrtec (cetirizine), Claritin (loratadine), Allegra (fexofenadine), or Xyzal (levocetirizine) daily as needed. May take twice a day if needed.  May use Flonase (fluticasone) nasal spray 1 spray per nostril twice a day as needed for nasal congestion.   Nasal saline spray (i.e., Simply Saline) or nasal saline lavage (i.e., NeilMed) is recommended as needed and prior to medicated nasal sprays.  Consider allergy immunotherapy once asthma is in better control and work schedule allows weekly injections.

## 2021-09-11 ENCOUNTER — Other Ambulatory Visit: Payer: Self-pay | Admitting: Allergy

## 2021-10-22 ENCOUNTER — Other Ambulatory Visit: Payer: Self-pay | Admitting: Allergy

## 2021-12-22 ENCOUNTER — Emergency Department: Admit: 2021-12-22 | Discharge status: Absent without leave | Payer: 59

## 2021-12-22 ENCOUNTER — Emergency Department (INDEPENDENT_AMBULATORY_CARE_PROVIDER_SITE_OTHER): Payer: 59

## 2021-12-22 ENCOUNTER — Emergency Department
Admission: EM | Admit: 2021-12-22 | Discharge: 2021-12-22 | Disposition: A | Discharge status: Home / Self Care | Payer: 59 | Source: Home / Self Care | Attending: Family Medicine | Admitting: Family Medicine

## 2021-12-22 ENCOUNTER — Encounter: Payer: Self-pay | Admitting: Emergency Medicine

## 2021-12-22 ENCOUNTER — Other Ambulatory Visit: Payer: Self-pay

## 2021-12-22 DIAGNOSIS — N390 Urinary tract infection, site not specified: Secondary | ICD-10-CM

## 2021-12-22 DIAGNOSIS — R109 Unspecified abdominal pain: Secondary | ICD-10-CM

## 2021-12-22 DIAGNOSIS — R1011 Right upper quadrant pain: Secondary | ICD-10-CM

## 2021-12-22 LAB — POCT URINALYSIS DIP (MANUAL ENTRY)
Bilirubin, UA: NEGATIVE
Blood, UA: NEGATIVE
Glucose, UA: NEGATIVE mg/dL
Ketones, POC UA: NEGATIVE mg/dL
Nitrite, UA: POSITIVE — AB
Protein Ur, POC: NEGATIVE mg/dL
Spec Grav, UA: >=1.03 — AB (ref 1.010–1.025)
Urobilinogen, UA: 0.2 E.U./dL
pH, UA: 5 (ref 5.0–8.0)

## 2021-12-22 LAB — POCT URINE PREGNANCY: Preg Test, Ur: NEGATIVE

## 2021-12-22 MED ORDER — TRAMADOL-ACETAMINOPHEN 37.5-325 MG PO TABS: 1.0000 | ORAL_TABLET | Freq: Four times a day (QID) | ORAL | 0 refills | Status: DC | PRN

## 2021-12-22 MED ORDER — CEFTRIAXONE SODIUM 1 G IJ SOLR
1000.0000 mg | Freq: Once | INTRAMUSCULAR | Status: AC
  Administered 2021-12-22: 1000 mg via INTRAMUSCULAR

## 2021-12-22 MED ORDER — AMOXICILLIN-POT CLAVULANATE 875-125 MG PO TABS: 1.0000 | ORAL_TABLET | Freq: Two times a day (BID) | ORAL | 0 refills | Status: DC

## 2021-12-22 MED ORDER — ONDANSETRON HCL 8 MG PO TABS: 8.0000 mg | ORAL_TABLET | Freq: Three times a day (TID) | ORAL | 0 refills | Status: DC | PRN

## 2021-12-22 NOTE — ED Provider Notes (Signed)
[facility]    CSN: [id number] Arrival date & time: 12/22/21  1324      History   Chief Complaint Chief Complaint  Patient presents with   Flank Pain    HPI [name] is a 41 y.o. female.   HPI  Patient is here for flank pain.  She has had pain in her right flank around to the right lower abdomen for the last 4 days.  She called for telemedicine.  They diagnosed her with a possible UTI.  She was having some urinary frequency.  They placed her on Macrodantin and Pyridium.  She has been taking these for 3 days and still has symptoms.  She is had fever to 101.  Decreased appetite and nausea.  No vomiting.  No diarrhea.  She has had no abdominal surgeries except for C-section years ago.  No history of kidney stones or kidney infections.  No history of urinary tract infections.  No gallbladder or colon disorders.  Past Medical History:  Diagnosis Date   Asthma    GERD (gastroesophageal reflux disease)    History of chicken pox    Pneumonia 2017   UTI (urinary tract infection)     Patient Active Problem List   Diagnosis Date Noted   Voice hoarseness 02/26/2021   Multiple drug allergies 11/29/2019   Severe persistent asthma without complication 99991111   Gastroesophageal reflux disease 08/22/2019   Seasonal and perennial allergic rhinoconjunctivitis 02/22/2019   History of frequent upper respiratory infection 01/24/2019   Left elbow fracture, closed, initial encounter 12/16/2018   Diarrhea 11/19/2016   Rectal bleeding 11/19/2016   Abdominal pain 11/19/2016   ADD (attention deficit disorder) 10/10/2016   Depression with anxiety 08/13/2014   Morbid obesity due to excess calories ([place]) 08/13/2014   Leukocytosis 11/27/2012   Dehydration 11/27/2012   Sinus tachycardia 11/27/2012   Hypokalemia 11/26/2012   Extrinsic asthma 08/17/2011    Past Surgical History:  Procedure Laterality Date   CESAREAN SECTION  09/27/12   NO PAST SURGERIES     ORIF ELBOW  FRACTURE Left 12/16/2018   Procedure: Left elbow radial head resection and arthroplasty with ligament repair as necessary;  Surgeon: Roseanne Kaufman, MD;  Location: [facility];  Service: Orthopedics;  Laterality: Left;  2 hrs    OB History   No obstetric history on file.      Home Medications    Prior to Admission medications   Medication Sig Start Date End Date Taking? Authorizing Provider  amoxicillin-clavulanate (AUGMENTIN) 875-125 MG tablet Take 1 tablet by mouth every 12 (twelve) hours. 12/22/21  Yes Raylene Everts, MD  ondansetron (ZOFRAN) 8 MG tablet Take 1 tablet (8 mg total) by mouth every 8 (eight) hours as needed for nausea or vomiting. 12/22/21  Yes Raylene Everts, MD  traMADol-acetaminophen (ULTRACET) 37.5-325 MG tablet Take 1-2 tablets by mouth every 6 (six) hours as needed. 12/22/21  Yes Raylene Everts, MD  albuterol (VENTOLIN HFA) 108 (90 Base) MCG/ACT inhaler USE 2 INHALATIONS ORALLY   EVERY 6 HOURS AS NEEDED    FOR WHEEZING OR SHORTNESS  OF BREATH 10/23/21   Garnet Sierras, DO  Benralizumab (FASENRA) 30 MG/ML SOSY Inject 1 mL (30 mg total) into the skin every 8 (eight) weeks. 03/05/21   Garnet Sierras, DO  budesonide-formoterol (SYMBICORT) 160-4.5 MCG/ACT inhaler Inhale 2 puffs into the lungs in the morning and at bedtime. with spacer and rinse mouth afterwards. 09/10/21   Garnet Sierras, DO  montelukast (SINGULAIR) 10 MG tablet Take 1 tablet (10 mg total) by mouth at bedtime. 09/10/21   Garnet Sierras, DO  Multiple Vitamin (MULTIVITAMIN WITH MINERALS) TABS tablet Take 1 tablet by mouth daily. Women's One A Day    [provider]  omeprazole (PRILOSEC) 40 MG capsule Take 1 capsule (40 mg total) by mouth daily. 09/10/21   Garnet Sierras, DO    Family History Family History  Problem Relation Age of Onset   Allergies Sister    Cancer Sister        fallopian tube   Fibroids Mother        Malignant   Asthma Mother    Diabetes Father    Colon cancer Maternal Grandfather     Diabetes Paternal Grandmother    Liver cancer Paternal Aunt    Asthma Maternal Uncle    Asthma Maternal Grandmother    Colon polyps Neg Hx    Esophageal cancer Neg Hx    Stomach cancer Neg Hx    Rectal cancer Neg Hx    Allergic rhinitis Neg Hx     Social History Social History   Tobacco Use   Smoking status: Former    Types: Cigarettes    Quit date: 2004    Years since quitting: 19.0   Smokeless tobacco: Never   Tobacco comments:    smoked 1-2 months then quit  Vaping Use   Vaping Use: Never used  Substance Use Topics   Alcohol use: No   Drug use: No     Allergies   Aspirin, Levaquin [levofloxacin in d5w], and Pamprin [apap-pamabrom-pyrilamine]   Review of Systems Review of Systems See HPI  Physical Exam Triage Vital Signs ED Triage Vitals  Enc Vitals Group     BP 12/22/21 1517 116/69     Pulse Rate 12/22/21 1517 (!) 105     Resp 12/22/21 1517 18     Temp 12/22/21 1517 99 F (37.2 C)     Temp Source 12/22/21 1517 Oral     SpO2 12/22/21 1517 96 %     Weight 12/22/21 1518 (!) 321 lb (145.6 kg)     Height 12/22/21 1518 5\' 6"  (1.676 m)     Head Circumference --      Peak Flow --      Pain Score 12/22/21 1518 7     Pain Loc --      Pain Edu? --      Excl. in [place]? --    No data found.  Updated Vital Signs BP 116/69 (BP Location: Left Arm)    Pulse (!) 105    Temp 99 F (37.2 C) (Oral)    Resp 18    Ht 5\' 6"  (1.676 m)    Wt (!) 145.6 kg    SpO2 96%    BMI 51.81 kg/m       Physical Exam Constitutional:      General: She is not in acute distress.    Appearance: She is well-developed. She is obese. She is ill-appearing.  HENT:     Head: Normocephalic and atraumatic.     Mouth/Throat:     Comments: Mask is in place Eyes:     Conjunctiva/sclera: Conjunctivae normal.     Pupils: Pupils are equal, round, and reactive to light.  Cardiovascular:     Rate and Rhythm: Normal rate and regular rhythm.     Heart sounds: Normal heart sounds.  Pulmonary:      Effort:  Pulmonary effort is normal. No respiratory distress.     Breath sounds: Normal breath sounds.  Abdominal:     General: There is no distension.     Palpations: Abdomen is soft. There is no mass.     Tenderness: There is abdominal tenderness. There is guarding. There is no right CVA tenderness, left CVA tenderness or rebound.     Comments: Obese abdomen.  There is tenderness in the right upper quadrant that is quite acute.  No tenderness in the right middle or lower quadrant.  No CVA tenderness.  No organomegaly  Musculoskeletal:        General: Normal range of motion.     Cervical back: Normal range of motion and neck supple.  Skin:    General: Skin is warm and dry.  Neurological:     Mental Status: She is alert.     UC Treatments / Results  Labs (all labs ordered are listed, but only abnormal results are displayed) Labs Reviewed  POCT URINALYSIS DIP (MANUAL ENTRY) - Abnormal; Notable for the following components:      Result Value   Color, UA orange (*)    Clarity, UA cloudy (*)    Spec Grav, UA >=1.030 (*)    Nitrite, UA Positive (*)    Leukocytes, UA Trace (*)    All other components within normal limits  URINE CULTURE  POCT URINE PREGNANCY    EKG   Radiology US Abdomen Limited RUQ (LIVER/GB)  Result Date: 12/22/2021 CLINICAL DATA:  Right-sided abdominal pain EXAM: ULTRASOUND ABDOMEN LIMITED RIGHT UPPER QUADRANT COMPARISON:  None. FINDINGS: Gallbladder: No gallstones or wall thickening visualized. No sonographic Murphy sign noted by sonographer. Common bile duct: Diameter: 2.5 mm Liver: Slight increased hepatic echogenicity, nonspecific. No focal hepatic abnormality or biliary dilatation pattern. No surrounding free fluid. Portal vein is patent on color Doppler imaging with normal direction of blood flow towards the liver. Other: Included views of the right kidney demonstrate no hydronephrosis. IMPRESSION: No acute finding by right upper quadrant ultrasound.  Electronically Signed   By: Jerilynn Mages.  Shick M.D.   On: 12/22/2021 16:48    Procedures Procedures (including critical care time)  Medications Ordered in UC Medications  cefTRIAXone (ROCEPHIN) injection 1,000 mg (has no administration in time range)    Initial Impression / Assessment and Plan / UC Course  I have reviewed the triage vital signs and the nursing notes.  Pertinent labs & imaging results that were available during my care of the patient were reviewed by me and considered in my medical decision making (see chart for details).     Ultrasound is negative for gallbladder disease I have a partially treated urinary tract infection with Pyridium obscuring urinalysis and urine culture report.  I am unable to confirm.  Alternatives would be diverticulosis or another febrile abdominal condition.  CT scan is not available as it is after 4.  I did give patient the choice of going to the emergency room or increasing her antibiotics as if she had pyelo-,Even though I explained that I am unable to confirm this diagnosis. Patient is advised not to use TeleMed services for infectious disease diagnoses.  She is emphasized that she must go to the emergency room if worse instead of better Final Clinical Impressions(s) / UC Diagnoses   Final diagnoses:  Flank pain  Lower urinary tract infectious disease     Discharge Instructions      Take Zofran for nausea.  Increase your fluid intake.  Take Ultracet as needed for pain.Take with food Take Augmentin 2 times a day for 7 days.  Take a probiotic while on Augmentin Your urine has been sent for culture If you get worse instead of better, increasing pain fever or vomiting, you must go to the emergency room Call for problems     ED Prescriptions     Medication Sig Dispense Auth. Provider   amoxicillin-clavulanate (AUGMENTIN) 875-125 MG tablet Take 1 tablet by mouth every 12 (twelve) hours. 14 tablet Raylene Everts, MD   ondansetron (ZOFRAN)  8 MG tablet Take 1 tablet (8 mg total) by mouth every 8 (eight) hours as needed for nausea or vomiting. 20 tablet Raylene Everts, MD   traMADol-acetaminophen (ULTRACET) 37.5-325 MG tablet Take 1-2 tablets by mouth every 6 (six) hours as needed. 20 tablet Raylene Everts, MD      I have reviewed the PDMP during this encounter.   Raylene Everts, MD 12/22/21 1729

## 2021-12-22 NOTE — ED Triage Notes (Addendum)
RT flank pain x 4 days, has UTI, taking Nitrofurantoin 100mg  and Phenazopyridine 200mg  x 3 days. Unvaccinated

## 2021-12-22 NOTE — Discharge Instructions (Addendum)
Take Zofran for nausea.  Increase your fluid intake.   Take Ultracet as needed for pain.Take with food Take Augmentin 2 times a day for 7 days.  Take a probiotic while on Augmentin Your urine has been sent for culture If you get worse instead of better, increasing pain fever or vomiting, you must go to the emergency room Call for problems

## 2021-12-23 NOTE — Progress Notes (Deleted)
Follow Up Note  RE: [name] MRN: [id number] DOB: Jul 22, 1981 Date of Office Visit: 12/24/2021  Referring provider: Sunnie Nielsen, DO Primary care provider: Sunnie Nielsen, DO  Chief Complaint: No chief complaint on file.  History of Present Illness: I had the pleasure of seeing [name] for a follow up visit at the [facility] on 12/23/2021. She is a 41 y.o. female, who is being followed for asthma on Fasenra, voice hoarseness, allergic rhinoconjunctivitis, GERD, multiple drug allergies and history of frequent upper respiratory infections. Her previous allergy office visit was on 09/10/2021 with Dr. Selena Batten. Today is a regular follow up visit.  Severe persistent asthma without complication Past history - Diagnosed with asthma over 30 years ago.  She was doing well up until 2012 when she got really ill and was hospitalized. Multiple courses of prednisone the last year with 2 ER visits and at least 6 urgent care visits as well.  Chest x-ray in December 2019 showed mild peribronchial thickening which may relate to chronic bronchitis or asthma.  Patient was followed by pulmonology in the past. COVID-19 in November 2020. Interim history - was doing well but late for Fasenra injections and stopped Spiriva. Now using albuterol 2-3 times a day but no prednisone since last visit.  Today's spirometry showed restriction. Daily controller medication(s):  continue Symbicort 2 puffs twice a day with spacer and rinse mouth afterwards. Restart Spiriva 1.36mcg 2 puffs daily.  Continue Singulair 10mg  daily.  Restart Fasenra injections every 8 weeks - okay to take at home.  Make appointment at [facility] office for the next injection so they can show you how to use it.  Prior to physical activity: May use albuterol rescue inhaler 2 puffs 5 to 15 minutes prior to strenuous physical activities. Rescue medications: May use albuterol rescue inhaler 2 puffs or nebulizer  every 4 to 6 hours as needed for shortness of breath, chest tightness, coughing, and wheezing. Monitor frequency of use.  During upper respiratory infections: Start Pulmicort 0.5mg  nebulizer 2 times a day for 1-2 weeks during asthma flares.  Get spirometry at next visit.   Voice hoarseness Past history - Since November 2021. Denies PND, already on PPI for GERD and using spacer and rinsing mouth after inhaler use. 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 Improved.  Follow up with ENT - Dr. December 2021 as scheduled.    Seasonal and perennial allergic rhinoconjunctivitis Past history - Perennial rhinoconjunctivitis symptoms with worsening in the fall and winter for 30+ years. 2020 skin testing was positive to: grass, tree, cat, dog, horse, mouse, ragweed, weed, mold. Interim history - stable with no daily meds. Continue environmental control measures especially regarding the cat dander.  May use over the counter antihistamines such as Zyrtec (cetirizine), Claritin (loratadine), Allegra (fexofenadine), or Xyzal (levocetirizine) daily as needed. May take twice a day if needed. May use Flonase (fluticasone) nasal spray 1 spray per nostril twice a day as needed for nasal congestion.  Nasal saline spray (i.e., Simply Saline) or nasal saline lavage (i.e., NeilMed) is recommended as needed and prior to medicated nasal sprays.  Consider allergy immunotherapy once asthma is in better control and work schedule allows weekly injections.    Gastroesophageal reflux disease Continue omeprazole 40mg  in the morning. Nothing to eat or drink for 30 minutes afterwards.     History of frequent upper respiratory infection Past history - History of frequent upper respiratory infections  including sinusitis, pneumonia and ear infections. 2020 normal immunoglobulin levels and good pneumococcal and tetanus/diptheria titers.  Interim history - no infections. Keep track of  infections.   Multiple drug allergies Past history - Reaction to aspirin in the form of hives as a child. Continue to avoid aspirin, Levaquin and Pamprin.  Consider drug challenge once asthma is better controlled.  Assessment and Plan: [name] is a 41 y.o. female with: No problem-specific Assessment & Plan notes found for this encounter.  No follow-ups on file.  No orders of the defined types were placed in this encounter.  Lab Orders  No laboratory test(s) ordered today    Diagnostics: Spirometry:  Tracings reviewed. Her effort: {Blank single:19197::"Good reproducible efforts.","It was hard to get consistent efforts and there is a question as to whether this reflects a maximal maneuver.","Poor effort, data can not be interpreted."} FVC: ***L FEV1: ***L, ***% predicted FEV1/FVC ratio: ***% Interpretation: {Blank single:19197::"Spirometry consistent with mild obstructive disease","Spirometry consistent with moderate obstructive disease","Spirometry consistent with severe obstructive disease","Spirometry consistent with possible restrictive disease","Spirometry consistent with mixed obstructive and restrictive disease","Spirometry uninterpretable due to technique","Spirometry consistent with normal pattern","No overt abnormalities noted given today's efforts"}.  Please see scanned spirometry results for details.  Skin Testing: {Blank single:19197::"Select foods","Environmental allergy panel","Environmental allergy panel and select foods","Food allergy panel","None","Deferred due to recent antihistamines use"}. *** Results discussed with patient/family.   Medication List:  Current Outpatient Medications  Medication Sig Dispense Refill   albuterol (VENTOLIN HFA) 108 (90 Base) MCG/ACT inhaler USE 2 INHALATIONS ORALLY   EVERY 6 HOURS AS NEEDED    FOR WHEEZING OR SHORTNESS  OF BREATH 17 g 1   amoxicillin-clavulanate (AUGMENTIN) 875-125 MG tablet Take 1 tablet by mouth every 12 (twelve)  hours. 14 tablet 0   Benralizumab (FASENRA) 30 MG/ML SOSY Inject 1 mL (30 mg total) into the skin every 8 (eight) weeks. 1 mL 6   budesonide-formoterol (SYMBICORT) 160-4.5 MCG/ACT inhaler Inhale 2 puffs into the lungs in the morning and at bedtime. with spacer and rinse mouth afterwards. 30 g 2   montelukast (SINGULAIR) 10 MG tablet Take 1 tablet (10 mg total) by mouth at bedtime. 90 tablet 2   Multiple Vitamin (MULTIVITAMIN WITH MINERALS) TABS tablet Take 1 tablet by mouth daily. Women's One A Day     omeprazole (PRILOSEC) 40 MG capsule Take 1 capsule (40 mg total) by mouth daily. 90 capsule 2   ondansetron (ZOFRAN) 8 MG tablet Take 1 tablet (8 mg total) by mouth every 8 (eight) hours as needed for nausea or vomiting. 20 tablet 0   traMADol-acetaminophen (ULTRACET) 37.5-325 MG tablet Take 1-2 tablets by mouth every 6 (six) hours as needed. 20 tablet 0   No current facility-administered medications for this visit.   Allergies: Allergies  Allergen Reactions   Aspirin Hives   Levaquin [Levofloxacin In D5w] Hives   Pamprin [Apap-Pamabrom-Pyrilamine] Hives   I reviewed her past medical history, social history, family history, and environmental history and no significant changes have been reported from her previous visit.  Review of Systems  Constitutional:  Negative for appetite change, chills, fever and unexpected weight change.  HENT:  Positive for voice change. Negative for congestion, postnasal drip and rhinorrhea.   Eyes:  Negative for itching.  Respiratory:  Positive for chest tightness and wheezing. Negative for cough and shortness of breath.   Cardiovascular:  Negative for chest pain.  Gastrointestinal:  Negative for abdominal pain.  Genitourinary:  Negative for difficulty urinating.  Skin:  Negative for  rash.  Allergic/Immunologic: Positive for environmental allergies. Negative for food allergies.  Neurological:  Negative for headaches.   Objective: There were no vitals taken  for this visit. There is no height or weight on file to calculate BMI. Physical Exam Vitals and nursing note reviewed.  Constitutional:      Appearance: Normal appearance. She is well-developed.  HENT:     Head: Normocephalic and atraumatic.     Right Ear: Tympanic membrane and external ear normal.     Left Ear: Tympanic membrane and external ear normal.     Nose: Nose normal.     Mouth/Throat:     Mouth: Mucous membranes are moist.     Pharynx: Oropharynx is clear.  Eyes:     Conjunctiva/sclera: Conjunctivae normal.  Cardiovascular:     Rate and Rhythm: Normal rate and regular rhythm.     Heart sounds: Normal heart sounds. No murmur heard.   No friction rub. No gallop.  Pulmonary:     Effort: Pulmonary effort is normal.     Breath sounds: Normal breath sounds. No wheezing or rales.  Musculoskeletal:     Cervical back: Neck supple.  Skin:    General: Skin is warm.     Findings: No rash.  Neurological:     Mental Status: She is alert and oriented to person, place, and time.  Psychiatric:        Behavior: Behavior normal.   Previous notes and tests were reviewed. The plan was reviewed with the patient/family, and all questions/concerned were addressed.  It was my pleasure to see Bethann Berkshirerisha today and participate in her care. Please feel free to contact me with any questions or concerns.  Sincerely,  Wyline MoodYoon Cookie Pore, DO Allergy & Immunology  [facility] [place] office: [phone] [facility] office: [phone]

## 2021-12-24 ENCOUNTER — Ambulatory Visit: Payer: Managed Care, Other (non HMO) | Admitting: Allergy

## 2021-12-24 DIAGNOSIS — K219 Gastro-esophageal reflux disease without esophagitis: Secondary | ICD-10-CM

## 2021-12-24 DIAGNOSIS — Z889 Allergy status to unspecified drugs, medicaments and biological substances status: Secondary | ICD-10-CM

## 2021-12-24 DIAGNOSIS — H1010 Acute atopic conjunctivitis, unspecified eye: Secondary | ICD-10-CM

## 2021-12-24 DIAGNOSIS — Z8709 Personal history of other diseases of the respiratory system: Secondary | ICD-10-CM

## 2021-12-24 DIAGNOSIS — J4550 Severe persistent asthma, uncomplicated: Secondary | ICD-10-CM

## 2021-12-24 DIAGNOSIS — R490 Dysphonia: Secondary | ICD-10-CM

## 2021-12-24 DIAGNOSIS — J309 Allergic rhinitis, unspecified: Secondary | ICD-10-CM

## 2021-12-29 ENCOUNTER — Inpatient Hospital Stay: Admission: RE | Admit: 2021-12-29 | Payer: 59 | Source: Ambulatory Visit

## 2021-12-31 ENCOUNTER — Other Ambulatory Visit: Payer: Self-pay | Admitting: Allergy

## 2022-04-02 ENCOUNTER — Other Ambulatory Visit: Payer: Self-pay | Admitting: Allergy

## 2022-05-05 ENCOUNTER — Telehealth: Payer: Self-pay

## 2022-05-05 NOTE — Telephone Encounter (Signed)
Patient called in regards to her fasenera injections. She stated she has not been able to get an injection for 3 months now. Please advise.  ?

## 2022-05-06 NOTE — Telephone Encounter (Signed)
Patient called again about her shot and has not had it in 3 months and need tammy to call her [phone].

## 2022-05-10 NOTE — Telephone Encounter (Signed)
L/M for patient. Her approval ended in Jan 2023 and she was suppose to have appt 1/5 but no show. Her last shipment was Jan from [facility] in order for patient to restart Harrington Challenger she will need to come in clinic for appt and current CBC if she not had one to get approval

## 2022-05-15 ENCOUNTER — Other Ambulatory Visit: Payer: Self-pay | Admitting: Allergy

## 2022-06-27 ENCOUNTER — Other Ambulatory Visit: Payer: Self-pay | Admitting: Allergy

## 2022-06-28 MED ORDER — ALBUTEROL SULFATE HFA 108 (90 BASE) MCG/ACT IN AERS: INHALATION_SPRAY | RESPIRATORY_TRACT | 0 refills | Status: DC

## 2022-06-28 NOTE — Addendum Note (Signed)
Addended by: Tawnya Crook on: 06/28/2022 01:34 PM   Modules accepted: Orders

## 2022-08-18 ENCOUNTER — Other Ambulatory Visit: Payer: Self-pay | Admitting: Allergy

## 2023-01-10 ENCOUNTER — Ambulatory Visit: Payer: 59 | Admitting: Allergy

## 2023-02-01 NOTE — Progress Notes (Unsigned)
Follow Up Note  RE: [name] MRN: [id number] DOB: June 20, 1981 Date of Office Visit: 02/02/2023  Referring provider: Emeterio Reeve, DO Primary care provider: Emeterio Reeve, DO  Chief Complaint: No chief complaint on file.  History of Present Illness: I had the pleasure of seeing [name] for a follow up visit at the [facility] on 02/01/2023. She is a 42 y.o. female, who is being followed for asthma, infections, multiple drug allergies. Her previous allergy office visit was on 09/10/2021 with Dr. Maudie Mercury. Today is a regular follow up visit.  evere persistent asthma without complication Past history - Diagnosed with asthma over 30 years ago.  She was doing well up until 2012 when she got really ill and was hospitalized. Multiple courses of prednisone the last year with 2 ER visits and at least 6 urgent care visits as well.  Chest x-ray in December 2019 showed mild peribronchial thickening which may relate to chronic bronchitis or asthma.  Patient was followed by pulmonology in the past. COVID-19 in November 2020. Interim history - was doing well but late for Fasenra injections and stopped Spiriva. Now using albuterol 2-3 times a day but no prednisone since last visit.  Today's spirometry showed restriction. Daily controller medication(s):  continue Symbicort 158mg 2 puffs twice a day with spacer and rinse mouth afterwards. Restart Spiriva 1.278m 2 puffs daily.  Continue Singulair 1011maily.  Restart Fasenra injections every 8 weeks - okay to take at home.  [facility] so they can show you how to use it.  Prior to physical activity: May use albuterol rescue inhaler 2 puffs 5 to 15 minutes prior to strenuous physical activities. Rescue medications: May use albuterol rescue inhaler 2 puffs or nebulizer every 4 to 6 hours as needed for shortness of breath, chest tightness, coughing, and wheezing. Monitor  frequency of use.  During upper respiratory infections: Start Pulmicort 0.5mg41mbulizer 2 times a day for 1-2 weeks during asthma flares.  Get spirometry at next visit.   Voice hoarseness Past history - Since November 2021. Denies PND, already on PPI for GERD and using spacer and rinsing mouth after inhaler use. 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 Improved.  Follow up with ENT - Dr. TeohBenjamine Molascheduled.    Seasonal and perennial allergic rhinoconjunctivitis Past history - Perennial rhinoconjunctivitis symptoms with worsening in the fall and winter for 30+ years. 2020 skin testing was positive to: grass, tree, cat, dog, horse, mouse, ragweed, weed, mold. Interim history - stable with no daily meds. Continue environmental control measures especially regarding the cat dander.  May use over the counter antihistamines such as Zyrtec (cetirizine), Claritin (loratadine), Allegra (fexofenadine), or Xyzal (levocetirizine) daily as needed. May take twice a day if needed. May use Flonase (fluticasone) nasal spray 1 spray per nostril twice a day as needed for nasal congestion.  Nasal saline spray (i.e., Simply Saline) or nasal saline lavage (i.e., NeilMed) is recommended as needed and prior to medicated nasal sprays.  Consider allergy immunotherapy once asthma is in better control and work schedule allows weekly injections.    Gastroesophageal reflux disease Continue omeprazole 40mg59mthe morning. Nothing to eat or drink for 30 minutes afterwards.     History of frequent upper respiratory infection Past history - History of frequent upper respiratory infections including sinusitis, pneumonia and ear infections. 2020 normal immunoglobulin levels and good pneumococcal  and tetanus/diptheria titers.  Interim history - no infections. Keep track of infections.   Multiple drug allergies Past history - Reaction to aspirin in the form of hives as a  child. Continue to avoid aspirin, Levaquin and Pamprin.  Consider drug challenge once asthma is better controlled.    Return in about 3 months (around 12/10/2021).  Assessment and Plan: [name] is a 42 y.o. female with: No problem-specific Assessment & Plan notes found for this encounter.  No follow-ups on file.  No orders of the defined types were placed in this encounter.  Lab Orders  No laboratory test(s) ordered today    Diagnostics: Spirometry:  Tracings reviewed. Her effort: {Blank single:19197::"Good reproducible efforts.","It was hard to get consistent efforts and there is a question as to whether this reflects a maximal maneuver.","Poor effort, data can not be interpreted."} FVC: ***L FEV1: ***L, ***% predicted FEV1/FVC ratio: ***% Interpretation: {Blank single:19197::"Spirometry consistent with mild obstructive disease","Spirometry consistent with moderate obstructive disease","Spirometry consistent with severe obstructive disease","Spirometry consistent with possible restrictive disease","Spirometry consistent with mixed obstructive and restrictive disease","Spirometry uninterpretable due to technique","Spirometry consistent with normal pattern","No overt abnormalities noted given today's efforts"}.  Please see scanned spirometry results for details.  Skin Testing: {Blank single:19197::"Select foods","Environmental allergy panel","Environmental allergy panel and select foods","Food allergy panel","None","Deferred due to recent antihistamines use"}. *** Results discussed with patient/family.   Medication List:  Current Outpatient Medications  Medication Sig Dispense Refill  . albuterol (VENTOLIN HFA) 108 (90 Base) MCG/ACT inhaler USE 2 INHALATIONS ORALLY EVERY 6 HOURS AS NEEDED FOR WHEEZING OR SHORTNESS OF BREATH 17 g 0  . amoxicillin-clavulanate (AUGMENTIN) 875-125 MG tablet Take 1 tablet by mouth every 12 (twelve) hours. 14 tablet 0  . Benralizumab (FASENRA) 30 MG/ML  SOSY Inject 1 mL (30 mg total) into the skin every 8 (eight) weeks. 1 mL 6  . montelukast (SINGULAIR) 10 MG tablet Take 1 tablet (10 mg total) by mouth at bedtime. 90 tablet 2  . Multiple Vitamin (MULTIVITAMIN WITH MINERALS) TABS tablet Take 1 tablet by mouth daily. Women's One A Day    . omeprazole (PRILOSEC) 40 MG capsule Take 1 capsule (40 mg total) by mouth daily. 90 capsule 2  . ondansetron (ZOFRAN) 8 MG tablet Take 1 tablet (8 mg total) by mouth every 8 (eight) hours as needed for nausea or vomiting. 20 tablet 0  . SYMBICORT 160-4.5 MCG/ACT inhaler USE 2 INHALATIONS ORALLY INTHE MORNING AND AT BEDTIME.WITH SPACER AND RINSE MOUTHAFTERWARDS 30.6 g 1  . traMADol-acetaminophen (ULTRACET) 37.5-325 MG tablet Take 1-2 tablets by mouth every 6 (six) hours as needed. 20 tablet 0   No current facility-administered medications for this visit.   Allergies: Allergies  Allergen Reactions  . Aspirin Hives  . Levaquin [Levofloxacin In D5w] Hives  . Pamprin [Apap-Pamabrom-Pyrilamine] Hives   I reviewed her past medical history, social history, family history, and environmental history and no significant changes have been reported from her previous visit.  Review of Systems  Constitutional:  Negative for appetite change, chills, fever and unexpected weight change.  HENT:  Positive for voice change. Negative for congestion, postnasal drip and rhinorrhea.   Eyes:  Negative for itching.  Respiratory:  Positive for chest tightness and wheezing. Negative for cough and shortness of breath.   Cardiovascular:  Negative for chest pain.  Gastrointestinal:  Negative for abdominal pain.  Genitourinary:  Negative for difficulty urinating.  Skin:  Negative for rash.  Allergic/Immunologic: Positive for environmental allergies. Negative for food allergies.  Neurological:  Negative  for headaches.   Objective: There were no vitals taken for this visit. There is no height or weight on file to calculate  BMI. Physical Exam Vitals and nursing note reviewed.  Constitutional:      Appearance: Normal appearance. She is well-developed.  HENT:     Head: Normocephalic and atraumatic.     Right Ear: Tympanic membrane and external ear normal.     Left Ear: Tympanic membrane and external ear normal.     Nose: Nose normal.     Mouth/Throat:     Mouth: Mucous membranes are moist.     Pharynx: Oropharynx is clear.  Eyes:     Conjunctiva/sclera: Conjunctivae normal.  Cardiovascular:     Rate and Rhythm: Normal rate and regular rhythm.     Heart sounds: Normal heart sounds. No murmur heard.    No friction rub. No gallop.  Pulmonary:     Effort: Pulmonary effort is normal.     Breath sounds: Normal breath sounds. No wheezing or rales.  Musculoskeletal:     Cervical back: Neck supple.  Skin:    General: Skin is warm.     Findings: No rash.  Neurological:     Mental Status: She is alert and oriented to person, place, and time.  Psychiatric:        Behavior: Behavior normal.  Previous notes and tests were reviewed. The plan was reviewed with the patient/family, and all questions/concerned were addressed.  It was my pleasure to see [name] today and participate in her care. Please feel free to contact me with any questions or concerns.  Sincerely,  Rexene Alberts, DO Allergy & Immunology  [facility] office: [place] office: [phone]

## 2023-02-02 ENCOUNTER — Ambulatory Visit (INDEPENDENT_AMBULATORY_CARE_PROVIDER_SITE_OTHER): Payer: 59 | Admitting: Allergy

## 2023-02-02 ENCOUNTER — Other Ambulatory Visit: Payer: Self-pay

## 2023-02-02 ENCOUNTER — Encounter: Payer: Self-pay | Admitting: Allergy

## 2023-02-02 ENCOUNTER — Telehealth: Payer: Self-pay

## 2023-02-02 VITALS — BP 124/80 | HR 92 | Temp 98.8°F | Resp 16 | Ht 66.0 in | Wt 284.4 lb

## 2023-02-02 DIAGNOSIS — H1010 Acute atopic conjunctivitis, unspecified eye: Secondary | ICD-10-CM

## 2023-02-02 DIAGNOSIS — H1013 Acute atopic conjunctivitis, bilateral: Secondary | ICD-10-CM

## 2023-02-02 DIAGNOSIS — J302 Other seasonal allergic rhinitis: Secondary | ICD-10-CM | POA: Diagnosis not present

## 2023-02-02 DIAGNOSIS — Z889 Allergy status to unspecified drugs, medicaments and biological substances status: Secondary | ICD-10-CM | POA: Diagnosis not present

## 2023-02-02 DIAGNOSIS — J4550 Severe persistent asthma, uncomplicated: Secondary | ICD-10-CM | POA: Diagnosis not present

## 2023-02-02 DIAGNOSIS — R490 Dysphonia: Secondary | ICD-10-CM

## 2023-02-02 DIAGNOSIS — B999 Unspecified infectious disease: Secondary | ICD-10-CM

## 2023-02-02 DIAGNOSIS — K219 Gastro-esophageal reflux disease without esophagitis: Secondary | ICD-10-CM | POA: Diagnosis not present

## 2023-02-02 DIAGNOSIS — Z8709 Personal history of other diseases of the respiratory system: Secondary | ICD-10-CM

## 2023-02-02 MED ORDER — BREZTRI AEROSPHERE 160-9-4.8 MCG/ACT IN AERO: 2.0000 | INHALATION_SPRAY | Freq: Two times a day (BID) | RESPIRATORY_TRACT | 5 refills | Status: DC

## 2023-02-02 MED ORDER — AIRSUPRA 90-80 MCG/ACT IN AERO: 2.0000 | INHALATION_SPRAY | RESPIRATORY_TRACT | 2 refills | Status: DC | PRN

## 2023-02-02 MED ORDER — RYALTRIS 665-25 MCG/ACT NA SUSP: 1.0000 | Freq: Two times a day (BID) | NASAL | 5 refills | Status: DC

## 2023-02-02 NOTE — Assessment & Plan Note (Signed)
Past history - Diagnosed with asthma over 30 years ago.  She was doing well up until 2012 when she got really ill and was hospitalized. Multiple courses of prednisone the last year with 2 ER visits and at least 6 urgent care visits as well.  Chest x-ray in December 2019 showed mild peribronchial thickening which may relate to chronic bronchitis or asthma.  Patient was followed by pulmonology in the past. COVID-19 in November 2020. Interim history - patient stopped Fasenra, Symbicort, Spiriva over 1 year ago. Since then using albuterol 2 puffs every 2-4 hours and needed 13 courses of prednisone since the last visit over 1 year ago. Patient would like to go back on her maintenance regimen.  Today's spirometry showed: restrictive disease with 8% improvement in FEV1 post bronchodilator treatment. Clinically feeling improved.  Daily controller medication(s):  start Breztri 2 puffs twice a day with spacer and rinse mouth afterwards. Samples given.  Let me know if not covered.  Restart Singulair 73m daily.  Get bloodwork and if you qualify will restart Fasenra injections - prefers at home injections.  May use Airsupra rescue inhaler 2 puffs every 4 to 6 hours as needed for shortness of breath, chest tightness, coughing, and wheezing. Do not use more than 12 puffs in 24 hours. May use Airsupra rescue inhaler 2 puffs 5 to 15 minutes prior to strenuous physical activities. Monitor frequency of use. Rinse mouth after each use. Coupon given. Sample given.  Get spirometry at next visit. Stressed importance on medical compliance with above regimen and discussed the risks/side effects of using frequent prednisone.

## 2023-02-02 NOTE — Assessment & Plan Note (Signed)
Past history - Since November 2021. Denies PND, already on PPI for GERD and using spacer and rinsing mouth after inhaler use. 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 Interim history -  Unchanged since last visit.  Recommend ENT evaluation with Dr. Rowe Clack.

## 2023-02-02 NOTE — Assessment & Plan Note (Signed)
Better with PPI BID dosing.  Continue omeprazole 87m twice a day. Nothing to eat or drink for 30 minutes afterwards.

## 2023-02-02 NOTE — Assessment & Plan Note (Signed)
Past history - Reaction to aspirin in the form of hives as a child. Continue to avoid aspirin, Levaquin and Pamprin.  Consider drug challenge once asthma is better controlled.

## 2023-02-02 NOTE — Patient Instructions (Addendum)
Asthma Daily controller medication(s):  start Breztri 2 puffs twice a day with spacer and rinse mouth afterwards. Samples given.  Let me know if not covered.  Restart Singulair 14m daily.  Get bloodwork and if you qualify we will restart Fasenra injections.  May use Airsupra rescue inhaler 2 puffs every 4 to 6 hours as needed for shortness of breath, chest tightness, coughing, and wheezing. Do not use more than 12 puffs in 24 hours. May use Airsupra rescue inhaler 2 puffs 5 to 15 minutes prior to strenuous physical activities. Monitor frequency of use. Rinse mouth after each use.  Coupon given.sample given.  Breathing control goals:  Full participation in all desired activities (may need albuterol before activity) Albuterol use two times or less a week on average (not counting use with activity) Cough interfering with sleep two times or less a month Oral steroids no more than once a year No hospitalizations    Voice hoarseness Recommend ENT evaluation with Dr. MRowe Clack   Seasonal and perennial allergic rhinoconjunctivitis 2020 skin testing was positive to: grass, tree, cat, dog, horse, mouse, ragweed, weed, mold. Continue environmental control measures. May use over the counter antihistamines such as Zyrtec (cetirizine), Claritin (loratadine), Allegra (fexofenadine), or Xyzal (levocetirizine) daily as needed. May take twice a day if needed. Start Ryaltris (olopatadine + mometasone nasal spray combination) 1-2 sprays per nostril twice a day. Sample given. This replaces your other nasal sprays. If this works well for you, then have [facility] ship the medication to your home - prescription already sent in.   Nasal saline spray (i.e., Simply Saline) or nasal saline lavage (i.e., NeilMed) is recommended as needed and prior to medicated nasal sprays.   History of frequent upper respiratory infection Keep track of infections and antibiotics use.  Get bloodwork.   Gastroesophageal reflux  disease Continue omeprazole 480mtwice a day. Nothing to eat or drink for 30 minutes afterwards.  Follow up in 2 months or sooner if needed.

## 2023-02-02 NOTE — Assessment & Plan Note (Signed)
Past history - Perennial rhinoconjunctivitis symptoms with worsening in the fall and winter for 30+ years. 2020 skin testing was positive to: grass, tree, cat, dog, horse, mouse, ragweed, weed, mold. Interim history - stable with no daily meds since no pet indoors.  Continue environmental control measures. May use over the counter antihistamines such as Zyrtec (cetirizine), Claritin (loratadine), Allegra (fexofenadine), or Xyzal (levocetirizine) daily as needed. May take twice a day if needed. Start Ryaltris (olopatadine + mometasone nasal spray combination) 1-2 sprays per nostril twice a day. Sample given. This replaces your other nasal sprays. If this works well for you, then have [facility] ship the medication to your home - prescription already sent in.   Nasal saline spray (i.e., Simply Saline) or nasal saline lavage (i.e., NeilMed) is recommended as needed and prior to medicated nasal sprays.

## 2023-02-02 NOTE — Assessment & Plan Note (Signed)
Past history - sinusitis, pneumonia and ear infections.  2020 bloodwork normal immunoglobulin levels and good titers for s. Pneumonia.  Interim history - needed 4 courses of antibiotics last year.  Keep track of infections and antibiotics use.  Get bloodwork.

## 2023-02-02 NOTE — Telephone Encounter (Signed)
Please refer to  Dr. Rowe Clack for voice hoariness and ENT evaluation

## 2023-02-10 ENCOUNTER — Telehealth: Payer: Self-pay | Admitting: Allergy

## 2023-02-10 LAB — STREP PNEUMONIAE 23 SEROTYPES IGG
Pneumo Ab Type 1*: 8 ug/mL (ref >1.3)
Pneumo Ab Type 12 (12F)*: 1.5 ug/mL (ref >1.3)
Pneumo Ab Type 14*: 13.6 ug/mL (ref >1.3)
Pneumo Ab Type 17 (17F)*: <0.1 ug/mL — ABNORMAL LOW (ref >1.3)
Pneumo Ab Type 19 (19F)*: 4.1 ug/mL (ref >1.3)
Pneumo Ab Type 2*: 5.7 ug/mL (ref >1.3)
Pneumo Ab Type 20*: 3.1 ug/mL (ref >1.3)
Pneumo Ab Type 22 (22F)*: 1.4 ug/mL (ref >1.3)
Pneumo Ab Type 23 (23F)*: <0.1 ug/mL — ABNORMAL LOW (ref >1.3)
Pneumo Ab Type 26 (6B)*: 0.5 ug/mL — ABNORMAL LOW (ref >1.3)
Pneumo Ab Type 3*: 0.8 ug/mL — ABNORMAL LOW (ref >1.3)
Pneumo Ab Type 34 (10A)*: 35.7 ug/mL (ref >1.3)
Pneumo Ab Type 4*: 1 ug/mL — ABNORMAL LOW (ref >1.3)
Pneumo Ab Type 43 (11A)*: 2.9 ug/mL (ref >1.3)
Pneumo Ab Type 5*: 18 ug/mL (ref >1.3)
Pneumo Ab Type 51 (7F)*: 3.9 ug/mL (ref >1.3)
Pneumo Ab Type 54 (15B)*: 13.4 ug/mL (ref >1.3)
Pneumo Ab Type 56 (18C)*: 0.8 ug/mL — ABNORMAL LOW (ref >1.3)
Pneumo Ab Type 57 (19A)*: 2.9 ug/mL (ref >1.3)
Pneumo Ab Type 68 (9V)*: 0.2 ug/mL — ABNORMAL LOW (ref >1.3)
Pneumo Ab Type 70 (33F)*: 10.5 ug/mL (ref >1.3)
Pneumo Ab Type 8*: 1.2 ug/mL — ABNORMAL LOW (ref >1.3)
Pneumo Ab Type 9 (9N)*: 2.8 ug/mL (ref >1.3)

## 2023-02-10 LAB — IGE: IgE (Immunoglobulin E), Serum: 128 IU/mL (ref 6–495)

## 2023-02-10 LAB — CBC WITH DIFFERENTIAL/PLATELET
Basophils Absolute: 0.1 10*3/uL (ref 0.0–0.2)
Basos: 1 %
EOS (ABSOLUTE): 0.4 10*3/uL (ref 0.0–0.4)
Eos: 3 %
Hematocrit: 42.2 % (ref 34.0–46.6)
Hemoglobin: 13.5 g/dL (ref 11.1–15.9)
Immature Grans (Abs): 0 10*3/uL (ref 0.0–0.1)
Immature Granulocytes: 0 %
Lymphocytes Absolute: 2 10*3/uL (ref 0.7–3.1)
Lymphs: 18 %
MCH: 29.2 pg (ref 26.6–33.0)
MCHC: 32 g/dL (ref 31.5–35.7)
MCV: 91 fL (ref 79–97)
Monocytes Absolute: 0.7 10*3/uL (ref 0.1–0.9)
Monocytes: 7 %
Neutrophils Absolute: 7.7 10*3/uL — ABNORMAL HIGH (ref 1.4–7.0)
Neutrophils: 71 %
Platelets: 375 10*3/uL (ref 150–450)
RBC: 4.63 x10E6/uL (ref 3.77–5.28)
RDW: 13.7 % (ref 11.7–15.4)
WBC: 10.8 10*3/uL (ref 3.4–10.8)

## 2023-02-10 LAB — IGG, IGA, IGM
IgA/Immunoglobulin A, Serum: 127 mg/dL (ref 87–352)
IgG (Immunoglobin G), Serum: 837 mg/dL (ref 586–1602)
IgM (Immunoglobulin M), Srm: 188 mg/dL (ref 26–217)

## 2023-02-10 NOTE — Telephone Encounter (Signed)
Please start PA process for Fasenra at home injections.  Patient used to take it before and was doing very well on it.  Eos 400 on 02/02/2023.  Thank you.

## 2023-02-11 NOTE — Telephone Encounter (Signed)
L/m for patient to contact me to advise approval and submit to [employer] for [place] pen

## 2023-02-15 NOTE — Telephone Encounter (Signed)
Spoke to patient and advised aprpoval and submit for [employer] to [employer]

## 2023-05-14 ENCOUNTER — Ambulatory Visit: Payer: 59

## 2023-05-14 ENCOUNTER — Telehealth: Payer: Self-pay | Admitting: Emergency Medicine

## 2023-05-14 NOTE — Telephone Encounter (Signed)
Reason for visit reviewed w/ Dr Cathren Harsh - per pt,  "I having pain in my upper abdomen and it's on upper right side. I have been throwing up bile and I think I have gallstones and need my gallbladder removed." Pt directed to ED for a higher level of care & imaging - RN explained CT was not available on the weekend

## 2023-06-09 ENCOUNTER — Other Ambulatory Visit: Payer: Self-pay | Admitting: Allergy

## 2023-07-04 ENCOUNTER — Telehealth: Payer: Self-pay | Admitting: Allergy

## 2023-07-04 NOTE — Telephone Encounter (Signed)
Called patient to schedule [employer].

## 2023-07-22 ENCOUNTER — Telehealth: Payer: Self-pay | Admitting: Allergy

## 2023-07-22 NOTE — Telephone Encounter (Signed)
Please advise 

## 2023-07-22 NOTE — Telephone Encounter (Signed)
[facility] [employer] called and stated the prior authorization for medication Francesca Oman [facility]) 30 MG/ML SOSY has been put on hold and has been closed due to no response from the prescriber, the PA  number [phone] call back number [phone].

## 2023-07-26 NOTE — Telephone Encounter (Signed)
Still waiting on patient to come in for appt l/m x 2

## 2023-08-24 ENCOUNTER — Telehealth: Payer: Self-pay | Admitting: Allergy

## 2023-08-24 NOTE — Telephone Encounter (Signed)
Left a message for a return call.

## 2023-08-24 NOTE — Telephone Encounter (Signed)
Patient would like a call back regarding medication to hold her over until she comes into her appt. A good call back number is [phone].

## 2023-08-26 NOTE — Telephone Encounter (Signed)
Called and left a voicemail asking for return to discuss.

## 2023-08-29 NOTE — Telephone Encounter (Signed)
Called and left a voicemail asking for return call to discuss.  

## 2023-08-30 NOTE — Telephone Encounter (Signed)
Patient called back to follow up on her Harrington Challenger. She does have a home injection due next Wednesday September 18th. She wants to make sure that she does not go without it. She has an appointment scheduled on October 31st.

## 2023-08-31 NOTE — Telephone Encounter (Signed)
Unfortunately I cannot get approval with Ins until she is seen since last visit she had just restarted her Harrington Challenger again. We had left message back in July and her auth expired 8/22. You can see about a sample if we have any pens on hand or have her come in earlier that Oct

## 2023-09-02 NOTE — Telephone Encounter (Signed)
Routing to [place] and Dr. Selena Batten to comment.

## 2023-09-04 NOTE — Telephone Encounter (Signed)
Please call patient and make her aware that is OVERDUE for an office visit.  Please have her schedule this week so we can figure out whether we need to give a sample while we resubmit re-approval for her Harrington Challenger.

## 2023-09-05 NOTE — Telephone Encounter (Signed)
I called the patient to move up her 10/20/23 appointment in [facility] for fasenra restart. I left a message for the patient to call the [place] office back.

## 2023-09-12 NOTE — Telephone Encounter (Signed)
I called the Patient and left a message for her to call the [place] office back regarding Dr. Elmyra Ricks previous note.

## 2023-09-16 NOTE — Telephone Encounter (Signed)
Called and left a voicemail asking for return call to inform.

## 2023-09-20 NOTE — Telephone Encounter (Signed)
I called and spoke with the patient, she is going to check her schedule in the morning and call and speak with me and see what we can do to move her appointment closer that will work with her work schedule.

## 2023-09-21 NOTE — Telephone Encounter (Signed)
Patient called back, she has been scheduled to come see Dr. Selena Batten in [facility] on October 15th.

## 2023-10-03 NOTE — Progress Notes (Unsigned)
Follow Up Note  RE: [name] MRN: [id number] DOB: [birth year] Date of Office Visit: 10/04/2023  Referring provider: Sunnie Nielsen, DO Primary care provider: Aldean Baker, MD  Chief Complaint: Follow-up (Wants to continue to fasenra )  History of Present Illness: I had the pleasure of seeing Tolanda Mielcarek for a follow up visit at the [facility] on 10/04/2023. She is a 42 y.o. female, who is being followed for asthma on Fasenra, allergic rhinoconjunctivitis, GERD, recurrent infections, multiple drug allergies. Her previous allergy office visit was on 01/31/2023 with Dr. Selena Batten. Today is a regular follow up visit.  Failed to follow-up as recommended  Discussed the use of AI scribe software for clinical note transcription with the patient, who gave verbal consent to proceed.  The patient, with a history of asthma, has been managing well with Markus Daft and Airsupra inhalers. She reports taking Breztri twice daily and has not needed to use the Airsupra rescue inhaler frequently. The last refill of Paulene Floor was approximately eight to nine months ago, and she still has about sixty doses left. The patient notes needing the rescue inhaler occasionally when exercising more intensively. She also reports weight loss, which she believes has contributed to her improved asthma control.  The patient has been administering Fasenra injections at home every eight weeks. However, she is a few weeks late for her most recent dose due to scheduling conflicts with work.   The patient also takes Singulair (montelukast) daily, but she has been off it for about two weeks and is unsure if it makes a significant difference to her symptoms. She also takes omeprazole twice daily for acid reflux, which has been exacerbated by her [facility] medication.  The patient has recently recovered from a sinus infection, which she treated with a Z-Pak and Tessalon pearls. She declined prednisone treatment. She  does not regularly take allergy medications but has used Flonase in the past.   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 She has seen an ENT specialist and was offered vocal therapy, but declined due to insurance coverage issues. She reports some ongoing raspiness in her voice, which she attributes to her job, which involves a lot of talking.  Over the past year, the patient has lost 73 pounds through diet, exercise, and the use of Wegovy. She reports feeling the best she has in a long time and is pleased with her current asthma management regimen.   Assessment and Plan: Rovenia is a 42 y.o. female with: Severe persistent asthma without complication Past history - Diagnosed with asthma over 30 years ago.  She was doing well up until 2012 when she got really ill and was hospitalized. Multiple courses of prednisone the last year with 2 ER visits and at least 6 urgent care visits as well.  Chest x-ray in December 2019 showed mild peribronchial thickening which may relate to chronic bronchitis or asthma.  Patient was followed by pulmonology in the past.  Interim history - doing much better with below regimen with no systemic steroids.  Today's spirometry showed: some mild restriction.  Daily controller medication(s): continue Breztri 2 puffs twice a day with spacer and rinse mouth afterwards. Restart Fasenra injections every 8 weeks - sample given. Will resubmit paperwork, Tammy will be in touch with you.  Restart Singulair 10mg  daily.  May use Airsupra rescue inhaler 2 puffs every 4 to 6 hours as needed for shortness of breath, chest  tightness, coughing, and wheezing. Do not use more than 12 puffs in 24 hours. May use Airsupra rescue inhaler 2 puffs 5 to 15 minutes prior to strenuous physical activities. Rinse mouth after each use.  Monitor frequency of use - if you need to use it more than twice per week on a consistent basis let us know.  Coupon  given.  Seasonal allergic rhinitis due to pollen Allergic rhinitis due to animal dander Allergic rhinitis due to mold Allergic conjunctivitis of both eyes Past history - Perennial rhinoconjunctivitis symptoms with worsening in the fall and winter for 30+ years. 2020 skin testing positive to grass, tree, cat, dog, horse, mouse, ragweed, weed, mold. Interim history - Ryaltris too irritating.  Continue environmental control measures. May use over the counter antihistamines such as Zyrtec (cetirizine), Claritin (loratadine), Allegra (fexofenadine), or Xyzal (levocetirizine) daily as needed. May take twice a day if needed. Use Flonase (fluticasone) nasal spray 1-2 sprays per nostril once a day as needed for nasal congestion.  Nasal saline spray (i.e., Simply Saline) or nasal saline lavage (i.e., NeilMed) is recommended as needed and prior to medicated nasal sprays.  Voice hoarseness Past history - Since November 2021. Denies PND, already on PPI for GERD and using spacer and rinsing mouth after inhaler use. 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 Interim history -  unchanged. Not interested in further work up.  Monitor symptoms.  Gastroesophageal reflux disease, unspecified whether esophagitis present Stable.  Continue omeprazole 40mg  twice a day. Nothing to eat or drink for 30 minutes afterwards.  Recurrent infections Past history - sinusitis, pneumonia and ear infections.  2020 bloodwork normal immunoglobulin levels and good titers for s. Pneumonia. 2024 bloodwork normal IgG, pneumococcal titers protective.  Keep track of infections and antibiotics use.   Multiple drug allergies Past history - Reaction to aspirin in the form of hives as a child. Continue to avoid aspirin, Levaquin and Pamprin.  Consider drug challenge once asthma is better controlled.   Return in about 6 months (around 04/03/2024).  Meds ordered this encounter  Medications   montelukast (SINGULAIR) 10 MG tablet    Sig: Take  1 tablet (10 mg total) by mouth at bedtime.    Dispense:  90 tablet    Refill:  2   Budeson-Glycopyrrol-Formoterol (BREZTRI AEROSPHERE) 160-9-4.8 MCG/ACT AERO    Sig: Inhale 2 puffs into the lungs in the morning and at bedtime. with spacer and rinse mouth afterwards.    Dispense:  10.7 g    Refill:  5   Albuterol-Budesonide (AIRSUPRA) 90-80 MCG/ACT AERO    Sig: Inhale 2 puffs into the lungs every 4 (four) hours as needed (coughing, wheezing, chest tightness). Do not exceed 12 puffs in 24 hours.    Dispense:  10.7 g    Refill:  1    BIN: [id number], PCN: [id number], GRP: [id number], ID [id number]   benralizumab (FASENRA) prefilled syringe 30 mg   Lab Orders  No laboratory test(s) ordered today    Diagnostics: Spirometry:  Tracings reviewed. Her effort: Good reproducible efforts. FVC: 3.14L FEV1: 2.30L, 71% predicted FEV1/FVC ratio: 73% Interpretation: Spirometry consistent with possible restrictive disease.  Please see scanned spirometry results for details.  Medication List:  Current Outpatient Medications  Medication Sig Dispense Refill   DULoxetine (CYMBALTA) 30 MG capsule Take 30 mg by mouth 2 (two) times daily.     EPINEPHrine 0.3 mg/0.3 mL IJ SOAJ injection      omeprazole (PRILOSEC) 40 MG capsule Take 1 capsule (40 mg  total) by mouth daily. 90 capsule 2   WEGOVY 1.7 MG/0.75ML SOAJ Inject into the skin.     Albuterol-Budesonide (AIRSUPRA) 90-80 MCG/ACT AERO Inhale 2 puffs into the lungs every 4 (four) hours as needed (coughing, wheezing, chest tightness). Do not exceed 12 puffs in 24 hours. 10.7 g 1   Benralizumab (FASENRA) 30 MG/ML SOSY Inject 1 mL (30 mg total) into the skin every 8 (eight) weeks. 1 mL 6   Budeson-Glycopyrrol-Formoterol (BREZTRI AEROSPHERE) 160-9-4.8 MCG/ACT AERO Inhale 2 puffs into the lungs in the morning and at bedtime. with spacer and rinse mouth afterwards. 10.7 g 5   fluticasone (FLONASE) 50 MCG/ACT nasal spray Place into the nose. (Patient not taking: Reported  on 10/04/2023)     montelukast (SINGULAIR) 10 MG tablet Take 1 tablet (10 mg total) by mouth at bedtime. 90 tablet 2   No current facility-administered medications for this visit.   Allergies: Allergies  Allergen Reactions   Aspirin Hives   Levaquin [Levofloxacin In D5w] Hives   Pamprin [Apap-Pamabrom-Pyrilamine] Hives   I reviewed her past medical history, social history, family history, and environmental history and no significant changes have been reported from her previous visit.  Review of Systems  Constitutional:  Negative for appetite change, chills, fever and unexpected weight change.  HENT:  Positive for voice change. Negative for congestion, postnasal drip and rhinorrhea.   Eyes:  Negative for itching.  Respiratory:  Negative for cough, chest tightness, shortness of breath and wheezing.   Cardiovascular:  Negative for chest pain.  Gastrointestinal:  Negative for abdominal pain.  Genitourinary:  Negative for difficulty urinating.  Skin:  Negative for rash.  Allergic/Immunologic: Positive for environmental allergies. Negative for food allergies.  Neurological:  Negative for headaches.    Objective: BP 102/76   Pulse 94   Temp 98.2 F (36.8 C)   Resp 18   Wt 216 lb 8 oz (98.2 kg)   SpO2 95%   BMI 34.94 kg/m  Body mass index is 34.94 kg/m. Physical Exam Vitals and nursing note reviewed.  Constitutional:      Appearance: Normal appearance. She is well-developed.  HENT:     Head: Normocephalic and atraumatic.     Right Ear: Tympanic membrane and external ear normal.     Left Ear: Tympanic membrane and external ear normal.     Nose: Nose normal.     Mouth/Throat:     Mouth: Mucous membranes are moist.     Pharynx: Oropharynx is clear.  Eyes:     Conjunctiva/sclera: Conjunctivae normal.  Cardiovascular:     Rate and Rhythm: Normal rate and regular rhythm.     Heart sounds: Normal heart sounds. No murmur heard.    No friction rub. No gallop.  Pulmonary:      Effort: Pulmonary effort is normal.     Breath sounds: Normal breath sounds. No wheezing or rales.  Musculoskeletal:     Cervical back: Neck supple.  Skin:    General: Skin is warm.     Findings: No rash.  Neurological:     Mental Status: She is alert and oriented to person, place, and time.  Psychiatric:        Behavior: Behavior normal.   Previous notes and tests were reviewed. The plan was reviewed with the patient/family, and all questions/concerned were addressed.  It was my pleasure to see Katarena today and participate in her care. Please feel free to contact me with any questions or concerns.  Sincerely,  Wyline Mood, DO Allergy & Immunology  [facility] office: [phone] [facility] office: [phone]

## 2023-10-04 ENCOUNTER — Encounter: Payer: Self-pay | Admitting: Allergy

## 2023-10-04 ENCOUNTER — Ambulatory Visit: Payer: 59 | Admitting: Allergy

## 2023-10-04 VITALS — BP 102/76 | HR 94 | Temp 98.2°F | Resp 18 | Wt 216.5 lb

## 2023-10-04 DIAGNOSIS — J3081 Allergic rhinitis due to animal (cat) (dog) hair and dander: Secondary | ICD-10-CM

## 2023-10-04 DIAGNOSIS — R490 Dysphonia: Secondary | ICD-10-CM

## 2023-10-04 DIAGNOSIS — Z889 Allergy status to unspecified drugs, medicaments and biological substances status: Secondary | ICD-10-CM

## 2023-10-04 DIAGNOSIS — J4550 Severe persistent asthma, uncomplicated: Secondary | ICD-10-CM

## 2023-10-04 DIAGNOSIS — H1013 Acute atopic conjunctivitis, bilateral: Secondary | ICD-10-CM

## 2023-10-04 DIAGNOSIS — K219 Gastro-esophageal reflux disease without esophagitis: Secondary | ICD-10-CM

## 2023-10-04 DIAGNOSIS — J301 Allergic rhinitis due to pollen: Secondary | ICD-10-CM | POA: Diagnosis not present

## 2023-10-04 DIAGNOSIS — J3089 Other allergic rhinitis: Secondary | ICD-10-CM

## 2023-10-04 DIAGNOSIS — B999 Unspecified infectious disease: Secondary | ICD-10-CM

## 2023-10-04 MED ORDER — BENRALIZUMAB 30 MG/ML ~~LOC~~ SOSY
30.0000 mg | PREFILLED_SYRINGE | Freq: Once | SUBCUTANEOUS | Status: AC
Start: 2023-10-04 — End: 2023-10-04
  Administered 2023-10-04: 30 mg via SUBCUTANEOUS

## 2023-10-04 MED ORDER — AIRSUPRA 90-80 MCG/ACT IN AERO: 2.0000 | INHALATION_SPRAY | RESPIRATORY_TRACT | 1 refills | Status: DC | PRN

## 2023-10-04 MED ORDER — MONTELUKAST SODIUM 10 MG PO TABS: 10.0000 mg | ORAL_TABLET | Freq: Every day | ORAL | 2 refills | Status: DC

## 2023-10-04 MED ORDER — BREZTRI AEROSPHERE 160-9-4.8 MCG/ACT IN AERO: 2.0000 | INHALATION_SPRAY | Freq: Two times a day (BID) | RESPIRATORY_TRACT | 5 refills | Status: DC

## 2023-10-04 NOTE — Patient Instructions (Addendum)
Asthma Daily controller medication(s): continue Breztri 2 puffs twice a day with spacer and rinse mouth afterwards. Restart Fasenra injections every 8 weeks - sample given. Will resubmit paperwork, Tammy will be in touch with you.  Restart Singulair 10mg  daily.  May use Airsupra rescue inhaler 2 puffs every 4 to 6 hours as needed for shortness of breath, chest tightness, coughing, and wheezing. Do not use more than 12 puffs in 24 hours. May use Airsupra rescue inhaler 2 puffs 5 to 15 minutes prior to strenuous physical activities. Rinse mouth after each use.  Monitor frequency of use - if you need to use it more than twice per week on a consistent basis let us know.  Coupon given. Breathing control goals:  Full participation in all desired activities (may need albuterol before activity) Albuterol use two times or less a week on average (not counting use with activity) Cough interfering with sleep two times or less a month Oral steroids no more than once a year No hospitalizations    Voice hoarseness Monitor symptoms.  Seasonal and perennial allergic rhinoconjunctivitis 2020 skin testing was positive to: grass, tree, cat, dog, horse, mouse, ragweed, weed, mold. Continue environmental control measures. May use over the counter antihistamines such as Zyrtec (cetirizine), Claritin (loratadine), Allegra (fexofenadine), or Xyzal (levocetirizine) daily as needed. May take twice a day if needed. Use Flonase (fluticasone) nasal spray 1-2 sprays per nostril once a day as needed for nasal congestion.  Nasal saline spray (i.e., Simply Saline) or nasal saline lavage (i.e., NeilMed) is recommended as needed and prior to medicated nasal sprays.   History of frequent upper respiratory infection Keep track of infections and antibiotics use.    Gastroesophageal reflux disease Continue omeprazole 40mg  twice a day. Nothing to eat or drink for 30 minutes afterwards.  Follow up in 6 months or sooner if  needed.  Keep up the good work with the exercise and weight loss.

## 2023-10-05 ENCOUNTER — Telehealth: Payer: Self-pay | Admitting: *Deleted

## 2023-10-05 NOTE — Telephone Encounter (Signed)
L/m for patient that fasenra approved and can call [employer] to order same

## 2023-10-05 NOTE — Telephone Encounter (Signed)
-----   Message from Ellamae Sia sent at 10/04/2023 11:17 AM EDT ----- Please resubmit PA for Fasenra every 8 weeks injections for asthma at home. Sample dose given today in the office as she is overdue. Thank you.

## 2023-10-20 ENCOUNTER — Ambulatory Visit: Payer: 59 | Admitting: Allergy

## 2024-02-16 ENCOUNTER — Other Ambulatory Visit: Payer: Self-pay | Admitting: Allergy

## 2024-03-28 NOTE — Progress Notes (Unsigned)
 Follow Up Note  RE: [name] MRN: [id number] DOB: [birth year] Date of Office Visit: 03/29/2024  Referring provider: Aldean Baker, MD Primary care provider: Aldean Baker, MD  Chief Complaint: No chief complaint on file.  History of Present Illness: I had the pleasure of seeing [name] for a follow up visit at the [facility] on 03/28/2024. She is a 43 y.o. female, who is being followed for asthma on Fasenra, allergic rhinoconjunctivitis, voice hoarseness, GERD, recurrent infections, multiple drug allergies. Her previous allergy office visit was on 10/04/2023 with Dr. Selena Batten. Today is a regular follow up visit.  Discussed the use of AI scribe software for clinical note transcription with the patient, who gave verbal consent to proceed.  History of Present Illness            ***  Assessment and Plan: [name] is a 43 y.o. female with: Severe persistent asthma without complication Past history - Diagnosed with asthma over 30 years ago.  She was doing well up until 2012 when she got really ill and was hospitalized. Multiple courses of prednisone the last year with 2 ER visits and at least 6 urgent care visits as well.  Chest x-ray in December 2019 showed mild peribronchial thickening which may relate to chronic bronchitis or asthma.  Patient was followed by pulmonology in the past.  Interim history - doing much better with below regimen with no systemic steroids.  Today's spirometry showed: some mild restriction.  Daily controller medication(s): continue Breztri 2 puffs twice a day with spacer and rinse mouth afterwards. Restart Fasenra injections every 8 weeks - sample given. Will resubmit paperwork, [name] will be in touch with you.  Restart Singulair 10mg  daily.  May use Airsupra rescue inhaler 2 puffs every 4 to 6 hours as needed for shortness of breath, chest tightness, coughing, and wheezing. Do not use more than 12 puffs in 24 hours. May use  Airsupra rescue inhaler 2 puffs 5 to 15 minutes prior to strenuous physical activities. Rinse mouth after each use.  Monitor frequency of use - if you need to use it more than twice per week on a consistent basis let us know.  Coupon given.   Seasonal allergic rhinitis due to pollen Allergic rhinitis due to animal dander Allergic rhinitis due to mold Allergic conjunctivitis of both eyes Past history - Perennial rhinoconjunctivitis symptoms with worsening in the fall and winter for 30+ years. 2020 skin testing positive to grass, tree, cat, dog, horse, mouse, ragweed, weed, mold. Interim history - [name] too irritating.  Continue environmental control measures. May use over the counter antihistamines such as Zyrtec (cetirizine), Claritin (loratadine), Allegra (fexofenadine), or Xyzal (levocetirizine) daily as needed. May take twice a day if needed. Use Flonase (fluticasone) nasal spray 1-2 sprays per nostril once a day as needed for nasal congestion.  Nasal saline spray (i.e., Simply Saline) or nasal saline lavage (i.e., NeilMed) is recommended as needed and prior to medicated nasal sprays.   Voice hoarseness Past history - Since November 2021. Denies PND, already on PPI for GERD and using spacer and rinsing mouth after inhaler use. 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 Interim history -  unchanged. Not interested in further work up.  Monitor symptoms.   Gastroesophageal reflux disease, unspecified whether esophagitis present Stable.  Continue omeprazole 40mg  twice a day. Nothing to eat or drink for 30 minutes afterwards.   Recurrent infections Past history - sinusitis, pneumonia and ear infections.  2020 bloodwork  normal immunoglobulin levels and good titers for s. Pneumonia. 2024 bloodwork normal IgG, pneumococcal titers protective.  Keep track of infections and antibiotics use.    Multiple drug allergies Past history - Reaction to aspirin in the form of hives as a child. Continue to  avoid aspirin, Levaquin and Pamprin.  Consider drug challenge once asthma is better controlled.  Assessment and Plan              No follow-ups on file.  No orders of the defined types were placed in this encounter.  Lab Orders  No laboratory test(s) ordered today    Diagnostics: Spirometry:  Tracings reviewed. Her effort: {Blank single:19197::"Good reproducible efforts.","It was hard to get consistent efforts and there is a question as to whether this reflects a maximal maneuver.","Poor effort, data can not be interpreted."} FVC: ***L FEV1: ***L, ***% predicted FEV1/FVC ratio: ***% Interpretation: {Blank single:19197::"Spirometry consistent with mild obstructive disease","Spirometry consistent with moderate obstructive disease","Spirometry consistent with severe obstructive disease","Spirometry consistent with possible restrictive disease","Spirometry consistent with mixed obstructive and restrictive disease","Spirometry uninterpretable due to technique","Spirometry consistent with normal pattern","No overt abnormalities noted given today's efforts"}.  Please see scanned spirometry results for details.  Skin Testing: {Blank single:19197::"Select foods","Environmental allergy panel","Environmental allergy panel and select foods","Food allergy panel","None","Deferred due to recent antihistamines use"}. *** Results discussed with patient/family.   Medication List:  Current Outpatient Medications  Medication Sig Dispense Refill  . Albuterol-Budesonide (AIRSUPRA) 90-80 MCG/ACT AERO Inhale 2 puffs into the lungs every 4 (four) hours as needed (coughing, wheezing, chest tightness). Do not exceed 12 puffs in 24 hours. 10.7 g 1  . Benralizumab (FASENRA) 30 MG/ML SOSY Inject 1 mL (30 mg total) into the skin every 8 (eight) weeks. 1 mL 6  . Budeson-Glycopyrrol-Formoterol (BREZTRI AEROSPHERE) 160-9-4.8 MCG/ACT AERO Inhale 2 puffs into the lungs in the morning and at bedtime. with spacer and  rinse mouth afterwards. 10.7 g 5  . DULoxetine (CYMBALTA) 30 MG capsule Take 30 mg by mouth 2 (two) times daily.    Marland Kitchen EPINEPHrine 0.3 mg/0.3 mL IJ SOAJ injection     . FASENRA PEN 30 MG/ML prefilled autoinjector INJECT 1 PEN UNDER THE SKIN EVERY 8 WEEKS 1 mL 6  . fluticasone (FLONASE) 50 MCG/ACT nasal spray Place into the nose. (Patient not taking: Reported on 10/04/2023)    . montelukast (SINGULAIR) 10 MG tablet Take 1 tablet (10 mg total) by mouth at bedtime. 90 tablet 2  . omeprazole (PRILOSEC) 40 MG capsule Take 1 capsule (40 mg total) by mouth daily. 90 capsule 2  . WEGOVY 1.7 MG/0.75ML SOAJ Inject into the skin.     No current facility-administered medications for this visit.   Allergies: Allergies  Allergen Reactions  . Aspirin Hives  . Levaquin [Levofloxacin In D5w] Hives  . Pamprin [Apap-Pamabrom-Pyrilamine] Hives   I reviewed her past medical history, social history, family history, and environmental history and no significant changes have been reported from her previous visit.  Review of Systems  Constitutional:  Negative for appetite change, chills, fever and unexpected weight change.  HENT:  Positive for voice change. Negative for congestion, postnasal drip and rhinorrhea.   Eyes:  Negative for itching.  Respiratory:  Negative for cough, chest tightness, shortness of breath and wheezing.   Cardiovascular:  Negative for chest pain.  Gastrointestinal:  Negative for abdominal pain.  Genitourinary:  Negative for difficulty urinating.  Skin:  Negative for rash.  Allergic/Immunologic: Positive for environmental allergies. Negative for food allergies.  Neurological:  Negative  for headaches.   Objective: There were no vitals taken for this visit. There is no height or weight on file to calculate BMI. Physical Exam Vitals and nursing note reviewed.  Constitutional:      Appearance: Normal appearance. She is well-developed.  HENT:     Head: Normocephalic and atraumatic.      Right Ear: Tympanic membrane and external ear normal.     Left Ear: Tympanic membrane and external ear normal.     Nose: Nose normal.     Mouth/Throat:     Mouth: Mucous membranes are moist.     Pharynx: Oropharynx is clear.  Eyes:     Conjunctiva/sclera: Conjunctivae normal.  Cardiovascular:     Rate and Rhythm: Normal rate and regular rhythm.     Heart sounds: Normal heart sounds. No murmur heard.    No friction rub. No gallop.  Pulmonary:     Effort: Pulmonary effort is normal.     Breath sounds: Normal breath sounds. No wheezing or rales.  Musculoskeletal:     Cervical back: Neck supple.  Skin:    General: Skin is warm.     Findings: No rash.  Neurological:     Mental Status: She is alert and oriented to person, place, and time.  Psychiatric:        Behavior: Behavior normal.  Previous notes and tests were reviewed. The plan was reviewed with the patient/family, and all questions/concerned were addressed.  It was my pleasure to see [name] today and participate in her care. Please feel free to contact me with any questions or concerns.  Sincerely,  Wyline Mood, DO Allergy & Immunology  [facility] office: [phone] [facility] office: [phone]

## 2024-03-29 ENCOUNTER — Ambulatory Visit: Payer: 59 | Admitting: Allergy

## 2024-03-29 ENCOUNTER — Other Ambulatory Visit: Payer: Self-pay

## 2024-03-29 ENCOUNTER — Encounter: Payer: Self-pay | Admitting: Allergy

## 2024-03-29 VITALS — BP 128/86 | HR 92 | Temp 98.7°F | Resp 18 | Ht 66.0 in | Wt 256.8 lb

## 2024-03-29 DIAGNOSIS — J4550 Severe persistent asthma, uncomplicated: Secondary | ICD-10-CM

## 2024-03-29 DIAGNOSIS — J301 Allergic rhinitis due to pollen: Secondary | ICD-10-CM

## 2024-03-29 DIAGNOSIS — Z889 Allergy status to unspecified drugs, medicaments and biological substances status: Secondary | ICD-10-CM

## 2024-03-29 DIAGNOSIS — B999 Unspecified infectious disease: Secondary | ICD-10-CM

## 2024-03-29 DIAGNOSIS — J3081 Allergic rhinitis due to animal (cat) (dog) hair and dander: Secondary | ICD-10-CM

## 2024-03-29 DIAGNOSIS — H1013 Acute atopic conjunctivitis, bilateral: Secondary | ICD-10-CM

## 2024-03-29 DIAGNOSIS — R490 Dysphonia: Secondary | ICD-10-CM

## 2024-03-29 DIAGNOSIS — Z888 Allergy status to other drugs, medicaments and biological substances status: Secondary | ICD-10-CM

## 2024-03-29 DIAGNOSIS — J3089 Other allergic rhinitis: Secondary | ICD-10-CM | POA: Diagnosis not present

## 2024-03-29 DIAGNOSIS — K219 Gastro-esophageal reflux disease without esophagitis: Secondary | ICD-10-CM

## 2024-03-29 MED ORDER — FAMOTIDINE 20 MG PO TABS: 20.0000 mg | ORAL_TABLET | Freq: Two times a day (BID) | ORAL | 5 refills | Status: DC | PRN

## 2024-03-29 MED ORDER — MONTELUKAST SODIUM 10 MG PO TABS: 10.0000 mg | ORAL_TABLET | Freq: Every day | ORAL | 11 refills | Status: DC

## 2024-03-29 MED ORDER — OLOPATADINE HCL 0.2 % OP SOLN: 1.0000 [drp] | Freq: Every day | OPHTHALMIC | 5 refills | Status: AC | PRN

## 2024-03-29 MED ORDER — BREZTRI AEROSPHERE 160-9-4.8 MCG/ACT IN AERO: 2.0000 | INHALATION_SPRAY | Freq: Two times a day (BID) | RESPIRATORY_TRACT | 5 refills | Status: DC

## 2024-03-29 NOTE — Patient Instructions (Signed)
 Asthma Daily controller medication(s): Breztri 2 puffs twice a day with spacer and rinse mouth afterwards. Continue Fasenra injections every 8 weeks.  Continue Singulair 10mg  daily.  May use Airsupra rescue inhaler 2 puffs every 4 to 6 hours as needed for shortness of breath, chest tightness, coughing, and wheezing. Do not use more than 12 puffs in 24 hours. May use Airsupra rescue inhaler 2 puffs 5 to 15 minutes prior to strenuous physical activities. Rinse mouth after each use.  Monitor frequency of use - if you need to use it more than twice per week on a consistent basis let us know.  May use albuterol rescue nebulizer every 4 to 6 hours as needed for shortness of breath, chest tightness, coughing, and wheezing.  Monitor frequency of use - if you need to use it more than twice per week on a consistent basis let us know.  Breathing control goals:  Full participation in all desired activities (may need albuterol before activity) Albuterol use two times or less a week on average (not counting use with activity) Cough interfering with sleep two times or less a month Oral steroids no more than once a year No hospitalizations    Voice hoarseness Monitor symptoms.  Seasonal and perennial allergic rhinoconjunctivitis 2020 skin testing positive to grass, tree, cat, dog, horse, mouse, ragweed, weed, mold. Continue environmental control measures. May use over the counter antihistamines such as Zyrtec (cetirizine), Claritin (loratadine), Allegra (fexofenadine), or Xyzal (levocetirizine) daily as needed. May take twice a day if needed. Use Flonase (fluticasone) nasal spray 1-2 sprays per nostril once a day as needed for nasal congestion.  Nasal saline spray (i.e., Simply Saline) or nasal saline lavage (i.e., NeilMed) is recommended as needed and prior to medicated nasal sprays. Use olopatadine eye drops 0.2% once a day as needed for itchy/watery eyes. Only use the eye drop that gets the red out on  rare occasions.    History of frequent upper respiratory infection Keep track of infections and antibiotics use.    Gastroesophageal reflux disease Continue omeprazole 40mg  twice a day. Nothing to eat or drink for 30 minutes afterwards. Try wean down to 40mg  once a day.  May take famotidine 20mg  twice a day.  Consider GI evaluation for possible EGD.   Follow up in 6 months or sooner if needed. Get flu shot in the fall.   Keep up the good work with the exercise and weight loss.

## 2024-05-03 ENCOUNTER — Other Ambulatory Visit: Payer: Self-pay

## 2024-05-03 ENCOUNTER — Encounter: Payer: Self-pay | Admitting: Allergy

## 2024-05-03 MED ORDER — AIRSUPRA 90-80 MCG/ACT IN AERO: 2.0000 | INHALATION_SPRAY | RESPIRATORY_TRACT | 1 refills | Status: DC | PRN

## 2024-08-02 NOTE — Telephone Encounter (Signed)
 Spoke with pharmacy as well as pt insurance. Per [employer] as of July 1 insurance no longer covers Zepbound will only cover [phone]. Pt informed of this and is requesting a new script for Wegovy 1.7 mg dosage as pt does not want to restart the Wegovy at the highest dose. Requesting Zofran  be sent in as well. Verbal given by Dr. Pannell to send in medication. Pt aware. Electronically signed by: Sharen [place], LPN 1/85/7974 89:51 AM

## 2024-08-17 ENCOUNTER — Other Ambulatory Visit: Payer: Self-pay | Admitting: Allergy

## 2024-10-01 NOTE — Progress Notes (Deleted)
 Follow Up Note  RE: [name] MRN: [id number] DOB: Nov 01, 1981 Date of Office Visit: 10/02/2024  Referring provider: Aileen Gravely, MD Primary care provider: Aileen Gravely, MD (Inactive)  Chief Complaint: No chief complaint on file.  History of Present Illness: I had the pleasure of seeing [name] for a follow up visit at the [facility] on 10/02/2024. She is a 43 y.o. female, who is being followed for asthma on Fasenra , allergic rhinoconjunctivitis, voice hoarseness, GERD, recurrent infections and multiple drug allergies. Her previous allergy  office visit was on 03/29/2024 with Dr. Luke. Today is a regular follow up visit.  Discussed the use of AI scribe software for clinical note transcription with the patient, who gave verbal consent to proceed.  History of Present Illness            ***  Assessment and Plan: [name] is a 43 y.o. female with: Severe persistent asthma without complication Past history - Diagnosed with asthma over 30 years ago.  She was doing well up until 2012 when she got really ill and was hospitalized. Multiple courses of prednisone  the last year with 2 ER visits and at least 6 urgent care visits as well.  Chest x-ray in December 2019 showed mild peribronchial thickening which may relate to chronic bronchitis or asthma.  Patient was followed by pulmonology in the past.  Interim history - well controlled with below regimen. Managed recent influenza episode without systemic steroids.  Today's spirometry was normal.  Daily controller medication(s): Breztri  2 puffs twice a day with spacer and rinse mouth afterwards. Continue Fasenra  injections every 8 weeks.  Continue Singulair  10mg  daily.  May use Airsupra  rescue inhaler 2 puffs every 4 to 6 hours as needed for shortness of breath, chest tightness, coughing, and wheezing. Do not use more than 12 puffs in 24 hours. May use Airsupra  rescue inhaler 2 puffs 5 to 15 minutes prior to  strenuous physical activities. Rinse mouth after each use.  Monitor frequency of use - if you need to use it more than twice per week on a consistent basis let us  know.  May use albuterol  rescue nebulizer every 4 to 6 hours as needed for shortness of breath, chest tightness, coughing, and wheezing.  Monitor frequency of use - if you need to use it more than twice per week on a consistent basis let us  know.    Seasonal allergic rhinitis due to pollen Allergic rhinitis due to animal dander Allergic rhinitis due to mold Allergic conjunctivitis of both eyes Past history - Perennial rhinoconjunctivitis symptoms with worsening in the fall and winter for 30+ years. 2020 skin testing positive to grass, tree, cat, dog, horse, mouse, ragweed, weed, mold. Ryaltris  too irritating.  Interim history - Minimal symptoms and uses OTC eye drops prn.  Continue environmental control measures. May use over the counter antihistamines such as Zyrtec (cetirizine), Claritin (loratadine), Allegra (fexofenadine), or Xyzal (levocetirizine) daily as needed. May take twice a day if needed. Use Flonase  (fluticasone ) nasal spray 1-2 sprays per nostril once a day as needed for nasal congestion.  Nasal saline spray (i.e., Simply Saline) or nasal saline lavage (i.e., NeilMed) is recommended as needed and prior to medicated nasal sprays. Use olopatadine  eye drops 0.2% once a day as needed for itchy/watery eyes. Only use the eye drop that gets the red out on rare occasions.    Voice hoarseness Past history - Since November 2021. Denies PND, already on PPI for GERD and using spacer  and rinsing mouth after inhaler use. 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 Interim history -  improved with less talking at work.  Monitor symptoms.   Gastroesophageal reflux disease, unspecified whether esophagitis present Still symptomatic.  Continue omeprazole  40mg  twice a day. Nothing to eat or drink for 30 minutes afterwards. Try wean down to 40mg   once a day.  May take famotidine  20mg  twice a day.  Consider GI evaluation for possible EGD.    Recurrent infections Past history - sinusitis, pneumonia and ear infections.  2020 bloodwork normal immunoglobulin levels and good titers for s. Pneumonia. 2024 bloodwork normal IgG, pneumococcal titers protective.  Keep track of infections and antibiotics use.    Multiple drug allergies Past history - Reaction to aspirin in the form of hives as a child. Continue to avoid aspirin, Levaquin  and Pamprin.  Consider drug challenge once asthma is better controlled.    Return in about 6 months (around 09/28/2024). Recommend flu shot in the fall. Assessment and Plan              No follow-ups on file.  No orders of the defined types were placed in this encounter.  Lab Orders  No laboratory test(s) ordered today    Diagnostics: Spirometry:  Tracings reviewed. Her effort: {Blank single:19197::Good reproducible efforts.,It was hard to get consistent efforts and there is a question as to whether this reflects a maximal maneuver.,Poor effort, data can not be interpreted.} FVC: ***L FEV1: ***L, ***% predicted FEV1/FVC ratio: ***% Interpretation: {Blank single:19197::Spirometry consistent with mild obstructive disease,Spirometry consistent with moderate obstructive disease,Spirometry consistent with severe obstructive disease,Spirometry consistent with possible restrictive disease,Spirometry consistent with mixed obstructive and restrictive disease,Spirometry uninterpretable due to technique,Spirometry consistent with normal pattern,No overt abnormalities noted given today's efforts}.  Please see scanned spirometry results for details.  Skin Testing: {Blank single:19197::Select foods,Environmental allergy  panel,Environmental allergy  panel and select foods,Food allergy  panel,None,Deferred due to recent antihistamines use}. *** Results discussed with  patient/family.   Medication List:  Current Outpatient Medications  Medication Sig Dispense Refill  . Albuterol -Budesonide  (AIRSUPRA ) 90-80 MCG/ACT AERO INHALE 2 PUFFS INTO THE LUNGS EVERY 4 (FOUR) HOURS AS NEEDED (COUGHING, WHEEZING, CHEST TIGHTNESS). DO NOT EXCEED 12 PUFFS IN 24 HOURS. 10.7 g 1  . budeson-glycopyrrolate-formoterol  (BREZTRI  AEROSPHERE) 160-9-4.8 MCG/ACT AERO inhaler Inhale 2 puffs into the lungs in the morning and at bedtime. with spacer and rinse mouth afterwards. 10.7 g 5  . DULoxetine (CYMBALTA) 30 MG capsule Take 30 mg by mouth 2 (two) times daily.    . EPINEPHrine  0.3 mg/0.3 mL IJ SOAJ injection     . famotidine  (PEPCID ) 20 MG tablet Take 1 tablet (20 mg total) by mouth 2 (two) times daily as needed for heartburn or indigestion. 60 tablet 5  . FASENRA  PEN 30 MG/ML prefilled autoinjector INJECT 1 PEN UNDER THE SKIN EVERY 8 WEEKS 1 mL 6  . fluticasone  (FLONASE ) 50 MCG/ACT nasal spray Place into the nose.    . montelukast  (SINGULAIR ) 10 MG tablet Take 1 tablet (10 mg total) by mouth at bedtime. 30 tablet 11  . Olopatadine  HCl 0.2 % SOLN Apply 1 drop to eye daily as needed (itchy/watery eyes). 2.5 mL 5  . omeprazole  (PRILOSEC) 40 MG capsule Take 1 capsule (40 mg total) by mouth daily. 90 capsule 2  . WEGOVY 1.7 MG/0.75ML SOAJ Inject into the skin.     No current facility-administered medications for this visit.   Allergies: Allergies  Allergen Reactions  . Aspirin Hives and Other (See Comments)  .  Levaquin  [Levofloxacin  In D5w] Hives  . Pamprin [Apap-Pamabrom-Pyrilamine] Hives  . Levofloxacin  Other (See Comments)   I reviewed her past medical history, social history, family history, and environmental history and no significant changes have been reported from her previous visit.  Review of Systems  Constitutional:  Negative for appetite change, chills, fever and unexpected weight change.  HENT:  Negative for congestion, postnasal drip and rhinorrhea.   Eyes:   Negative for itching.  Respiratory:  Negative for cough, chest tightness, shortness of breath and wheezing.   Cardiovascular:  Negative for chest pain.  Gastrointestinal:  Negative for abdominal pain.  Genitourinary:  Negative for difficulty urinating.  Skin:  Negative for rash.  Allergic/Immunologic: Positive for environmental allergies. Negative for food allergies.  Neurological:  Negative for headaches.    Objective: There were no vitals taken for this visit. There is no height or weight on file to calculate BMI. Physical Exam Vitals and nursing note reviewed.  Constitutional:      Appearance: Normal appearance. She is well-developed.  HENT:     Head: Normocephalic and atraumatic.     Right Ear: Tympanic membrane and external ear normal.     Left Ear: Tympanic membrane and external ear normal.     Nose: Congestion (on left side) present.     Mouth/Throat:     Mouth: Mucous membranes are moist.     Pharynx: Oropharynx is clear.  Eyes:     Conjunctiva/sclera: Conjunctivae normal.  Cardiovascular:     Rate and Rhythm: Normal rate and regular rhythm.     Heart sounds: Normal heart sounds. No murmur heard.    No friction rub. No gallop.  Pulmonary:     Effort: Pulmonary effort is normal.     Breath sounds: Normal breath sounds. No wheezing or rales.  Musculoskeletal:     Cervical back: Neck supple.  Skin:    General: Skin is warm.     Findings: No rash.  Neurological:     Mental Status: She is alert and oriented to person, place, and time.  Psychiatric:        Behavior: Behavior normal.   Previous notes and tests were reviewed. The plan was reviewed with the patient/family, and all questions/concerned were addressed.  It was my pleasure to see [name] today and participate in her care. Please feel free to contact me with any questions or concerns.  Sincerely,  Orlan Cramp, DO Allergy  & Immunology  [facility]  [place] office:  [phone] [facility] office: [phone]

## 2024-10-02 ENCOUNTER — Ambulatory Visit: Admitting: Allergy

## 2024-10-02 DIAGNOSIS — J4550 Severe persistent asthma, uncomplicated: Secondary | ICD-10-CM

## 2024-10-02 DIAGNOSIS — J301 Allergic rhinitis due to pollen: Secondary | ICD-10-CM

## 2024-10-02 DIAGNOSIS — Z889 Allergy status to unspecified drugs, medicaments and biological substances status: Secondary | ICD-10-CM

## 2024-10-02 DIAGNOSIS — J3089 Other allergic rhinitis: Secondary | ICD-10-CM

## 2024-10-02 DIAGNOSIS — K219 Gastro-esophageal reflux disease without esophagitis: Secondary | ICD-10-CM

## 2024-10-02 DIAGNOSIS — H1013 Acute atopic conjunctivitis, bilateral: Secondary | ICD-10-CM

## 2024-10-02 DIAGNOSIS — R490 Dysphonia: Secondary | ICD-10-CM

## 2024-10-02 DIAGNOSIS — J3081 Allergic rhinitis due to animal (cat) (dog) hair and dander: Secondary | ICD-10-CM

## 2024-10-02 DIAGNOSIS — B999 Unspecified infectious disease: Secondary | ICD-10-CM

## 2024-10-16 ENCOUNTER — Ambulatory Visit: Admitting: Allergy

## 2024-10-16 NOTE — Progress Notes (Deleted)
 Follow Up Note  RE: [name] MRN: [id number] DOB: 11/26/1981 Date of Office Visit: 10/16/2024  Referring provider: Aileen Gravely, MD Primary care provider: Aileen Gravely, MD (Inactive)  Chief Complaint: No chief complaint on file.  History of Present Illness: I had the pleasure of seeing [name] for a follow up visit at the [facility] on 10/16/2024. She is a 43 y.o. female, who is being followed for asthma on Fasenra , allergic rhinoconjunctivitis, voice hoarseness, GERD, recurrent infections and multiple drug allergies. Her previous allergy  office visit was on 03/29/2024 with Dr. Luke. Today is a regular follow up visit.  Discussed the use of AI scribe software for clinical note transcription with the patient, who gave verbal consent to proceed.  History of Present Illness            ***  Assessment and Plan: Jerlene is a 43 y.o. female with: Severe persistent asthma without complication Past history - Diagnosed with asthma over 30 years ago.  She was doing well up until 2012 when she got really ill and was hospitalized. Multiple courses of prednisone  the last year with 2 ER visits and at least 6 urgent care visits as well.  Chest x-ray in December 2019 showed mild peribronchial thickening which may relate to chronic bronchitis or asthma.  Patient was followed by pulmonology in the past.  Interim history - well controlled with below regimen. Managed recent influenza episode without systemic steroids.  Today's spirometry was normal.  Daily controller medication(s): Breztri  2 puffs twice a day with spacer and rinse mouth afterwards. Continue Fasenra  injections every 8 weeks.  Continue Singulair  10mg  daily.  May use Airsupra  rescue inhaler 2 puffs every 4 to 6 hours as needed for shortness of breath, chest tightness, coughing, and wheezing. Do not use more than 12 puffs in 24 hours. May use Airsupra  rescue inhaler 2 puffs 5 to 15 minutes prior to  strenuous physical activities. Rinse mouth after each use.  Monitor frequency of use - if you need to use it more than twice per week on a consistent basis let us  know.  May use albuterol  rescue nebulizer every 4 to 6 hours as needed for shortness of breath, chest tightness, coughing, and wheezing.  Monitor frequency of use - if you need to use it more than twice per week on a consistent basis let us  know.    Seasonal allergic rhinitis due to pollen Allergic rhinitis due to animal dander Allergic rhinitis due to mold Allergic conjunctivitis of both eyes Past history - Perennial rhinoconjunctivitis symptoms with worsening in the fall and winter for 30+ years. 2020 skin testing positive to grass, tree, cat, dog, horse, mouse, ragweed, weed, mold. Ryaltris  too irritating.  Interim history - Minimal symptoms and uses OTC eye drops prn.  Continue environmental control measures. May use over the counter antihistamines such as Zyrtec (cetirizine), Claritin (loratadine), Allegra (fexofenadine), or Xyzal (levocetirizine) daily as needed. May take twice a day if needed. Use Flonase  (fluticasone ) nasal spray 1-2 sprays per nostril once a day as needed for nasal congestion.  Nasal saline spray (i.e., Simply Saline) or nasal saline lavage (i.e., NeilMed) is recommended as needed and prior to medicated nasal sprays. Use olopatadine  eye drops 0.2% once a day as needed for itchy/watery eyes. Only use the eye drop that gets the red out on rare occasions.    Voice hoarseness Past history - Since November 2021. Denies PND, already on PPI for GERD and using spacer  and rinsing mouth after inhaler use. 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 Interim history -  improved with less talking at work.  Monitor symptoms.   Gastroesophageal reflux disease, unspecified whether esophagitis present Still symptomatic.  Continue omeprazole  40mg  twice a day. Nothing to eat or drink for 30 minutes afterwards. Try wean down to 40mg   once a day.  May take famotidine  20mg  twice a day.  Consider GI evaluation for possible EGD.    Recurrent infections Past history - sinusitis, pneumonia and ear infections.  2020 bloodwork normal immunoglobulin levels and good titers for s. Pneumonia. 2024 bloodwork normal IgG, pneumococcal titers protective.  Keep track of infections and antibiotics use.    Multiple drug allergies Past history - Reaction to aspirin in the form of hives as a child. Continue to avoid aspirin, Levaquin  and Pamprin.  Consider drug challenge once asthma is better controlled.    Return in about 6 months (around 09/28/2024). Recommend flu shot in the fall. Assessment and Plan              No follow-ups on file.  No orders of the defined types were placed in this encounter.  Lab Orders  No laboratory test(s) ordered today    Diagnostics: Spirometry:  Tracings reviewed. Her effort: {Blank single:19197::Good reproducible efforts.,It was hard to get consistent efforts and there is a question as to whether this reflects a maximal maneuver.,Poor effort, data can not be interpreted.} FVC: ***L FEV1: ***L, ***% predicted FEV1/FVC ratio: ***% Interpretation: {Blank single:19197::Spirometry consistent with mild obstructive disease,Spirometry consistent with moderate obstructive disease,Spirometry consistent with severe obstructive disease,Spirometry consistent with possible restrictive disease,Spirometry consistent with mixed obstructive and restrictive disease,Spirometry uninterpretable due to technique,Spirometry consistent with normal pattern,No overt abnormalities noted given today's efforts}.  Please see scanned spirometry results for details.  Skin Testing: {Blank single:19197::Select foods,Environmental allergy  panel,Environmental allergy  panel and select foods,Food allergy  panel,None,Deferred due to recent antihistamines use}. *** Results discussed with  patient/family.   Medication List:  Current Outpatient Medications  Medication Sig Dispense Refill   Albuterol -Budesonide  (AIRSUPRA ) 90-80 MCG/ACT AERO INHALE 2 PUFFS INTO THE LUNGS EVERY 4 (FOUR) HOURS AS NEEDED (COUGHING, WHEEZING, CHEST TIGHTNESS). DO NOT EXCEED 12 PUFFS IN 24 HOURS. 10.7 g 1   budeson-glycopyrrolate-formoterol  (BREZTRI  AEROSPHERE) 160-9-4.8 MCG/ACT AERO inhaler Inhale 2 puffs into the lungs in the morning and at bedtime. with spacer and rinse mouth afterwards. 10.7 g 5   DULoxetine (CYMBALTA) 30 MG capsule Take 30 mg by mouth 2 (two) times daily.     EPINEPHrine  0.3 mg/0.3 mL IJ SOAJ injection      famotidine  (PEPCID ) 20 MG tablet Take 1 tablet (20 mg total) by mouth 2 (two) times daily as needed for heartburn or indigestion. 60 tablet 5   FASENRA  PEN 30 MG/ML prefilled autoinjector INJECT 1 PEN UNDER THE SKIN EVERY 8 WEEKS 1 mL 6   fluticasone  (FLONASE ) 50 MCG/ACT nasal spray Place into the nose.     montelukast  (SINGULAIR ) 10 MG tablet Take 1 tablet (10 mg total) by mouth at bedtime. 30 tablet 11   Olopatadine  HCl 0.2 % SOLN Apply 1 drop to eye daily as needed (itchy/watery eyes). 2.5 mL 5   omeprazole  (PRILOSEC) 40 MG capsule Take 1 capsule (40 mg total) by mouth daily. 90 capsule 2   WEGOVY 1.7 MG/0.75ML SOAJ Inject into the skin.     No current facility-administered medications for this visit.   Allergies: Allergies  Allergen Reactions   Aspirin Hives and Other (See Comments)  Levaquin  [Levofloxacin  In D5w] Hives   Pamprin [Apap-Pamabrom-Pyrilamine] Hives   Levofloxacin  Other (See Comments)   I reviewed her past medical history, social history, family history, and environmental history and no significant changes have been reported from her previous visit.  Review of Systems  Constitutional:  Negative for appetite change, chills, fever and unexpected weight change.  HENT:  Negative for congestion, postnasal drip and rhinorrhea.   Eyes:  Negative for itching.   Respiratory:  Negative for cough, chest tightness, shortness of breath and wheezing.   Cardiovascular:  Negative for chest pain.  Gastrointestinal:  Negative for abdominal pain.  Genitourinary:  Negative for difficulty urinating.  Skin:  Negative for rash.  Allergic/Immunologic: Positive for environmental allergies. Negative for food allergies.  Neurological:  Negative for headaches.    Objective: There were no vitals taken for this visit. There is no height or weight on file to calculate BMI. Physical Exam Vitals and nursing note reviewed.  Constitutional:      Appearance: Normal appearance. She is well-developed.  HENT:     Head: Normocephalic and atraumatic.     Right Ear: Tympanic membrane and external ear normal.     Left Ear: Tympanic membrane and external ear normal.     Nose: Congestion (on left side) present.     Mouth/Throat:     Mouth: Mucous membranes are moist.     Pharynx: Oropharynx is clear.  Eyes:     Conjunctiva/sclera: Conjunctivae normal.  Cardiovascular:     Rate and Rhythm: Normal rate and regular rhythm.     Heart sounds: Normal heart sounds. No murmur heard.    No friction rub. No gallop.  Pulmonary:     Effort: Pulmonary effort is normal.     Breath sounds: Normal breath sounds. No wheezing or rales.  Musculoskeletal:     Cervical back: Neck supple.  Skin:    General: Skin is warm.     Findings: No rash.  Neurological:     Mental Status: She is alert and oriented to person, place, and time.  Psychiatric:        Behavior: Behavior normal.    Previous notes and tests were reviewed. The plan was reviewed with the patient/family, and all questions/concerned were addressed.  It was my pleasure to see Kaisa today and participate in her care. Please feel free to contact me with any questions or concerns.  Sincerely,  Orlan Cramp, DO Allergy  & Immunology  [facility]  [place] office: [phone] [facility]  office: [phone]

## 2024-10-30 ENCOUNTER — Telehealth: Payer: Self-pay

## 2024-10-30 NOTE — Telephone Encounter (Signed)
 Needs MD appt for reapproval

## 2024-10-30 NOTE — Telephone Encounter (Signed)
 Received fax request from [employer] [employer] prior auth unit regarding patients Fesenra. Please send clinical information and proper critiria to fax number [phone] or [phone] Questions call number [phone]

## 2024-11-01 ENCOUNTER — Ambulatory Visit: Admitting: Allergy

## 2024-11-12 NOTE — Progress Notes (Unsigned)
 Follow Up Note  RE: [name] MRN: [id number] DOB: April 05, 1981 Date of Office Visit: 11/13/2024  Referring provider: No ref. provider found Primary care provider: Aileen Gravely, MD (Inactive)  Chief Complaint: No chief complaint on file.  History of Present Illness: I had the pleasure of seeing [name] for a follow up visit at the [facility] on 11/13/2024. She is a 43 y.o. female, who is being followed for asthma on Fasenra , allergic rhinoconjunctivitis, voice hoarseness, GERD, recurrent infections, multiple drug allergies. Her previous allergy  office visit was on 03/29/2024 with Dr. Luke. Today is a regular follow up visit.  Discussed the use of AI scribe software for clinical note transcription with the patient, who gave verbal consent to proceed.  History of Present Illness            ***  Assessment and Plan: [name] is a 43 y.o. female with: Severe persistent asthma without complication Past history - Diagnosed with asthma over 30 years ago.  She was doing well up until 2012 when she got really ill and was hospitalized. Multiple courses of prednisone  the last year with 2 ER visits and at least 6 urgent care visits as well.  Chest x-ray in December 2019 showed mild peribronchial thickening which may relate to chronic bronchitis or asthma.  Patient was followed by pulmonology in the past.  Interim history - well controlled with below regimen. Managed recent influenza episode without systemic steroids.  Today's spirometry was normal.  Daily controller medication(s): Breztri  2 puffs twice a day with spacer and rinse mouth afterwards. Continue Fasenra  injections every 8 weeks.  Continue Singulair  10mg  daily.  May use Airsupra  rescue inhaler 2 puffs every 4 to 6 hours as needed for shortness of breath, chest tightness, coughing, and wheezing. Do not use more than 12 puffs in 24 hours. May use Airsupra  rescue inhaler 2 puffs 5 to 15 minutes prior to  strenuous physical activities. Rinse mouth after each use.  Monitor frequency of use - if you need to use it more than twice per week on a consistent basis let us  know.  May use albuterol  rescue nebulizer every 4 to 6 hours as needed for shortness of breath, chest tightness, coughing, and wheezing.  Monitor frequency of use - if you need to use it more than twice per week on a consistent basis let us  know.    Seasonal allergic rhinitis due to pollen Allergic rhinitis due to animal dander Allergic rhinitis due to mold Allergic conjunctivitis of both eyes Past history - Perennial rhinoconjunctivitis symptoms with worsening in the fall and winter for 30+ years. 2020 skin testing positive to grass, tree, cat, dog, horse, mouse, ragweed, weed, mold. Ryaltris  too irritating.  Interim history - Minimal symptoms and uses OTC eye drops prn.  Continue environmental control measures. May use over the counter antihistamines such as Zyrtec (cetirizine), Claritin (loratadine), Allegra (fexofenadine), or Xyzal (levocetirizine) daily as needed. May take twice a day if needed. Use Flonase  (fluticasone ) nasal spray 1-2 sprays per nostril once a day as needed for nasal congestion.  Nasal saline spray (i.e., Simply Saline) or nasal saline lavage (i.e., NeilMed) is recommended as needed and prior to medicated nasal sprays. Use olopatadine  eye drops 0.2% once a day as needed for itchy/watery eyes. Only use the eye drop that gets the red out on rare occasions.    Voice hoarseness Past history - Since November 2021. Denies PND, already on PPI for GERD and using spacer  and rinsing mouth after inhaler use. 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 Interim history -  improved with less talking at work.  Monitor symptoms.   Gastroesophageal reflux disease, unspecified whether esophagitis present Still symptomatic.  Continue omeprazole  40mg  twice a day. Nothing to eat or drink for 30 minutes afterwards. Try wean down to 40mg   once a day.  May take famotidine  20mg  twice a day.  Consider GI evaluation for possible EGD.    Recurrent infections Past history - sinusitis, pneumonia and ear infections.  2020 bloodwork normal immunoglobulin levels and good titers for s. Pneumonia. 2024 bloodwork normal IgG, pneumococcal titers protective.  Keep track of infections and antibiotics use.    Multiple drug allergies Past history - Reaction to aspirin in the form of hives as a child. Continue to avoid aspirin, Levaquin  and Pamprin.  Consider drug challenge once asthma is better controlled.  Assessment and Plan              No follow-ups on file.  No orders of the defined types were placed in this encounter.  Lab Orders  No laboratory test(s) ordered today    Diagnostics: Spirometry:  Tracings reviewed. Her effort: {Blank single:19197::Good reproducible efforts.,It was hard to get consistent efforts and there is a question as to whether this reflects a maximal maneuver.,Poor effort, data can not be interpreted.} FVC: ***L FEV1: ***L, ***% predicted FEV1/FVC ratio: ***% Interpretation: {Blank single:19197::Spirometry consistent with mild obstructive disease,Spirometry consistent with moderate obstructive disease,Spirometry consistent with severe obstructive disease,Spirometry consistent with possible restrictive disease,Spirometry consistent with mixed obstructive and restrictive disease,Spirometry uninterpretable due to technique,Spirometry consistent with normal pattern,No overt abnormalities noted given today's efforts}.  Please see scanned spirometry results for details.  Skin Testing: {Blank single:19197::Select foods,Environmental allergy  panel,Environmental allergy  panel and select foods,Food allergy  panel,None,Deferred due to recent antihistamines use}. *** Results discussed with patient/family.   Medication List:  Current Outpatient Medications  Medication Sig  Dispense Refill  . Albuterol -Budesonide  (AIRSUPRA ) 90-80 MCG/ACT AERO INHALE 2 PUFFS INTO THE LUNGS EVERY 4 (FOUR) HOURS AS NEEDED (COUGHING, WHEEZING, CHEST TIGHTNESS). DO NOT EXCEED 12 PUFFS IN 24 HOURS. 10.7 g 1  . budeson-glycopyrrolate-formoterol  (BREZTRI  AEROSPHERE) 160-9-4.8 MCG/ACT AERO inhaler Inhale 2 puffs into the lungs in the morning and at bedtime. with spacer and rinse mouth afterwards. 10.7 g 5  . DULoxetine (CYMBALTA) 30 MG capsule Take 30 mg by mouth 2 (two) times daily.    . EPINEPHrine  0.3 mg/0.3 mL IJ SOAJ injection     . famotidine  (PEPCID ) 20 MG tablet Take 1 tablet (20 mg total) by mouth 2 (two) times daily as needed for heartburn or indigestion. 60 tablet 5  . FASENRA  PEN 30 MG/ML prefilled autoinjector INJECT 1 PEN UNDER THE SKIN EVERY 8 WEEKS 1 mL 6  . fluticasone  (FLONASE ) 50 MCG/ACT nasal spray Place into the nose.    . montelukast  (SINGULAIR ) 10 MG tablet Take 1 tablet (10 mg total) by mouth at bedtime. 30 tablet 11  . Olopatadine  HCl 0.2 % SOLN Apply 1 drop to eye daily as needed (itchy/watery eyes). 2.5 mL 5  . omeprazole  (PRILOSEC) 40 MG capsule Take 1 capsule (40 mg total) by mouth daily. 90 capsule 2  . WEGOVY 1.7 MG/0.75ML SOAJ Inject into the skin.     No current facility-administered medications for this visit.   Allergies: Allergies  Allergen Reactions  . Aspirin Hives and Other (See Comments)  . Levaquin  [Levofloxacin  In D5w] Hives  . Pamprin [Apap-Pamabrom-Pyrilamine] Hives  . Levofloxacin   Other (See Comments)   I reviewed her past medical history, social history, family history, and environmental history and no significant changes have been reported from her previous visit.  Review of Systems  Constitutional:  Negative for appetite change, chills, fever and unexpected weight change.  HENT:  Negative for congestion, postnasal drip and rhinorrhea.   Eyes:  Negative for itching.  Respiratory:  Negative for cough, chest tightness, shortness of breath  and wheezing.   Cardiovascular:  Negative for chest pain.  Gastrointestinal:  Negative for abdominal pain.  Genitourinary:  Negative for difficulty urinating.  Skin:  Negative for rash.  Allergic/Immunologic: Positive for environmental allergies. Negative for food allergies.  Neurological:  Negative for headaches.    Objective: There were no vitals taken for this visit. There is no height or weight on file to calculate BMI. Physical Exam Vitals and nursing note reviewed.  Constitutional:      Appearance: Normal appearance. She is well-developed.  HENT:     Head: Normocephalic and atraumatic.     Right Ear: Tympanic membrane and external ear normal.     Left Ear: Tympanic membrane and external ear normal.     Nose: Congestion (on left side) present.     Mouth/Throat:     Mouth: Mucous membranes are moist.     Pharynx: Oropharynx is clear.  Eyes:     Conjunctiva/sclera: Conjunctivae normal.  Cardiovascular:     Rate and Rhythm: Normal rate and regular rhythm.     Heart sounds: Normal heart sounds. No murmur heard.    No friction rub. No gallop.  Pulmonary:     Effort: Pulmonary effort is normal.     Breath sounds: Normal breath sounds. No wheezing or rales.  Musculoskeletal:     Cervical back: Neck supple.  Skin:    General: Skin is warm.     Findings: No rash.  Neurological:     Mental Status: She is alert and oriented to person, place, and time.  Psychiatric:        Behavior: Behavior normal.   Previous notes and tests were reviewed. The plan was reviewed with the patient/family, and all questions/concerned were addressed.  It was my pleasure to see Vienne today and participate in her care. Please feel free to contact me with any questions or concerns.  Sincerely,  Orlan Cramp, DO Allergy  & Immunology  [facility]  [place] office: [phone] [facility] office: [phone]

## 2024-11-13 ENCOUNTER — Other Ambulatory Visit: Payer: Self-pay

## 2024-11-13 ENCOUNTER — Ambulatory Visit (INDEPENDENT_AMBULATORY_CARE_PROVIDER_SITE_OTHER): Admitting: Allergy

## 2024-11-13 ENCOUNTER — Encounter: Payer: Self-pay | Admitting: Allergy

## 2024-11-13 VITALS — BP 112/88 | HR 91 | Temp 98.6°F | Resp 16 | Ht 66.0 in | Wt 259.5 lb

## 2024-11-13 DIAGNOSIS — J3089 Other allergic rhinitis: Secondary | ICD-10-CM | POA: Diagnosis not present

## 2024-11-13 DIAGNOSIS — J3081 Allergic rhinitis due to animal (cat) (dog) hair and dander: Secondary | ICD-10-CM | POA: Diagnosis not present

## 2024-11-13 DIAGNOSIS — B999 Unspecified infectious disease: Secondary | ICD-10-CM

## 2024-11-13 DIAGNOSIS — R490 Dysphonia: Secondary | ICD-10-CM

## 2024-11-13 DIAGNOSIS — J301 Allergic rhinitis due to pollen: Secondary | ICD-10-CM

## 2024-11-13 DIAGNOSIS — J4550 Severe persistent asthma, uncomplicated: Secondary | ICD-10-CM

## 2024-11-13 DIAGNOSIS — K219 Gastro-esophageal reflux disease without esophagitis: Secondary | ICD-10-CM

## 2024-11-13 DIAGNOSIS — H1013 Acute atopic conjunctivitis, bilateral: Secondary | ICD-10-CM

## 2024-11-13 DIAGNOSIS — Z889 Allergy status to unspecified drugs, medicaments and biological substances status: Secondary | ICD-10-CM

## 2024-11-13 MED ORDER — AIRSUPRA 90-80 MCG/ACT IN AERO: 2.0000 | INHALATION_SPRAY | RESPIRATORY_TRACT | 1 refills | Status: AC | PRN

## 2024-11-13 MED ORDER — MONTELUKAST SODIUM 10 MG PO TABS
10.0000 mg | ORAL_TABLET | Freq: Every day | ORAL | 3 refills | Status: AC
Start: 2024-11-13 — End: ?

## 2024-11-13 MED ORDER — FAMOTIDINE 20 MG PO TABS: 20.0000 mg | ORAL_TABLET | Freq: Two times a day (BID) | ORAL | 3 refills | Status: AC | PRN

## 2024-11-13 MED ORDER — BREZTRI AEROSPHERE 160-9-4.8 MCG/ACT IN AERO: 2.0000 | INHALATION_SPRAY | Freq: Two times a day (BID) | RESPIRATORY_TRACT | 5 refills | Status: AC

## 2024-11-13 NOTE — Patient Instructions (Addendum)
 Asthma Daily controller medication(s): Breztri  2 puffs twice a day with spacer and rinse mouth afterwards. Continue Fasenra  injections every 8 weeks.  Will resubmit paperwork to continue injections.  Continue Singulair  10mg  daily.  May use Airsupra  rescue inhaler 2 puffs every 4 to 6 hours as needed for shortness of breath, chest tightness, coughing, and wheezing. Do not use more than 12 puffs in 24 hours. May use Airsupra  rescue inhaler 2 puffs 5 to 15 minutes prior to strenuous physical activities. Rinse mouth after each use.  Monitor frequency of use - if you need to use it more than twice per week on a consistent basis let us  know.  Breathing control goals:  Full participation in all desired activities (may need albuterol  before activity) Albuterol  use two times or less a week on average (not counting use with activity) Cough interfering with sleep two times or less a month Oral steroids no more than once a year No hospitalizations    Voice hoarseness Monitor symptoms.  Seasonal and perennial allergic rhinoconjunctivitis 2020 skin testing positive to grass, tree, cat, dog, horse, mouse, ragweed, weed, mold. Continue environmental control measures. May use over the counter antihistamines such as Zyrtec (cetirizine), Claritin (loratadine), Allegra (fexofenadine), or Xyzal (levocetirizine) daily as needed. May take twice a day if needed. Use Flonase  (fluticasone ) nasal spray 1-2 sprays per nostril once a day as needed for nasal congestion.  Nasal saline spray (i.e., Simply Saline) or nasal saline lavage (i.e., NeilMed) is recommended as needed and prior to medicated nasal sprays. Use olopatadine  eye drops 0.2% once a day as needed for itchy/watery eyes. Only use the eye drop that gets the red out on rare occasions.    History of frequent upper respiratory infection Keep track of infections and antibiotics use.    Gastroesophageal reflux disease Continue lifestyle and dietary  modifications. Continue famotidine  20mg  twice a day.   Follow up in 6 months or sooner if needed.

## 2025-05-14 ENCOUNTER — Ambulatory Visit: Admitting: Allergy
